# Patient Record
Sex: Female | Born: 1960 | Race: White | Hispanic: No | Marital: Married | Smoking: Never smoker
Health system: Southern US, Community
[De-identification: ages and names within clinical notes are randomized; demographics above are authoritative.]

## PROBLEM LIST (undated history)

## (undated) DIAGNOSIS — Z85828 Personal history of other malignant neoplasm of skin: Secondary | ICD-10-CM

## (undated) DIAGNOSIS — I10 Essential (primary) hypertension: Secondary | ICD-10-CM

## (undated) DIAGNOSIS — J45909 Unspecified asthma, uncomplicated: Secondary | ICD-10-CM

## (undated) DIAGNOSIS — I5189 Other ill-defined heart diseases: Secondary | ICD-10-CM

## (undated) DIAGNOSIS — I447 Left bundle-branch block, unspecified: Secondary | ICD-10-CM

## (undated) DIAGNOSIS — F419 Anxiety disorder, unspecified: Secondary | ICD-10-CM

## (undated) DIAGNOSIS — C50919 Malignant neoplasm of unspecified site of unspecified female breast: Secondary | ICD-10-CM

## (undated) HISTORY — DX: Anxiety disorder, unspecified: F41.9

## (undated) HISTORY — PX: BASAL CELL CARCINOMA EXCISION: SHX1214

## (undated) HISTORY — DX: Left bundle-branch block, unspecified: I44.7

## (undated) HISTORY — DX: Personal history of other malignant neoplasm of skin: Z85.828

## (undated) HISTORY — DX: Essential (primary) hypertension: I10

## (undated) HISTORY — DX: Malignant neoplasm of unspecified site of unspecified female breast: C50.919

## (undated) HISTORY — DX: Unspecified asthma, uncomplicated: J45.909

## (undated) HISTORY — DX: Other ill-defined heart diseases: I51.89

---

## 1998-01-11 ENCOUNTER — Inpatient Hospital Stay (HOSPITAL_COMMUNITY): Admission: AD | Admit: 1998-01-11 | Discharge: 1998-01-13 | Payer: Self-pay | Admitting: Obstetrics & Gynecology

## 1998-02-06 ENCOUNTER — Encounter (HOSPITAL_COMMUNITY): Admission: RE | Admit: 1998-02-06 | Discharge: 1998-05-07 | Payer: Self-pay | Admitting: *Deleted

## 1998-05-09 ENCOUNTER — Encounter (HOSPITAL_COMMUNITY): Admission: RE | Admit: 1998-05-09 | Discharge: 1998-08-07 | Payer: Self-pay | Admitting: *Deleted

## 1998-08-20 ENCOUNTER — Encounter (HOSPITAL_COMMUNITY): Admission: RE | Admit: 1998-08-20 | Discharge: 1998-11-18 | Payer: Self-pay | Admitting: *Deleted

## 1998-12-08 ENCOUNTER — Encounter (HOSPITAL_COMMUNITY): Admission: RE | Admit: 1998-12-08 | Discharge: 1999-03-08 | Payer: Self-pay | Admitting: *Deleted

## 1999-05-12 ENCOUNTER — Other Ambulatory Visit: Admission: RE | Admit: 1999-05-12 | Discharge: 1999-05-12 | Payer: Self-pay | Admitting: Obstetrics & Gynecology

## 1999-10-06 ENCOUNTER — Ambulatory Visit (HOSPITAL_COMMUNITY): Admission: RE | Admit: 1999-10-06 | Discharge: 1999-10-06 | Payer: Self-pay | Admitting: Obstetrics & Gynecology

## 1999-11-19 ENCOUNTER — Observation Stay (HOSPITAL_COMMUNITY): Admission: AD | Admit: 1999-11-19 | Discharge: 1999-11-20 | Payer: Self-pay | Admitting: Family Medicine

## 1999-12-03 ENCOUNTER — Inpatient Hospital Stay (HOSPITAL_COMMUNITY): Admission: AD | Admit: 1999-12-03 | Discharge: 1999-12-07 | Payer: Self-pay | Admitting: Obstetrics & Gynecology

## 1999-12-08 ENCOUNTER — Encounter: Admission: RE | Admit: 1999-12-08 | Discharge: 2000-03-07 | Payer: Self-pay | Admitting: Obstetrics & Gynecology

## 2000-03-09 ENCOUNTER — Encounter: Admission: RE | Admit: 2000-03-09 | Discharge: 2000-06-07 | Payer: Self-pay | Admitting: Obstetrics & Gynecology

## 2000-06-09 ENCOUNTER — Encounter: Admission: RE | Admit: 2000-06-09 | Discharge: 2000-09-07 | Payer: Self-pay | Admitting: Obstetrics & Gynecology

## 2000-09-08 ENCOUNTER — Encounter: Admission: RE | Admit: 2000-09-08 | Discharge: 2000-10-10 | Payer: Self-pay | Admitting: Obstetrics & Gynecology

## 2009-12-10 ENCOUNTER — Inpatient Hospital Stay (HOSPITAL_COMMUNITY): Admission: EM | Admit: 2009-12-10 | Discharge: 2009-12-13 | Payer: Self-pay | Admitting: Emergency Medicine

## 2009-12-10 ENCOUNTER — Ambulatory Visit: Payer: Self-pay | Admitting: Internal Medicine

## 2010-03-08 ENCOUNTER — Other Ambulatory Visit: Admission: RE | Admit: 2010-03-08 | Discharge: 2010-03-08 | Payer: Self-pay | Admitting: Family Medicine

## 2010-03-09 LAB — LIPID PANEL
Cholesterol: 133 mg/dL (ref 0–200)
HDL: 56 mg/dL (ref 35–70)
LDL Cholesterol: 63 mg/dL
Triglycerides: 75 mg/dL (ref 40–160)

## 2010-03-30 ENCOUNTER — Encounter: Admission: RE | Admit: 2010-03-30 | Discharge: 2010-03-30 | Payer: Self-pay | Admitting: Family Medicine

## 2010-05-10 ENCOUNTER — Ambulatory Visit: Payer: Self-pay | Admitting: Cardiovascular Disease

## 2010-10-21 ENCOUNTER — Encounter: Payer: Self-pay | Admitting: Cardiovascular Disease

## 2010-10-21 DIAGNOSIS — F419 Anxiety disorder, unspecified: Secondary | ICD-10-CM | POA: Insufficient documentation

## 2010-10-21 DIAGNOSIS — I5189 Other ill-defined heart diseases: Secondary | ICD-10-CM | POA: Insufficient documentation

## 2010-10-21 DIAGNOSIS — J45909 Unspecified asthma, uncomplicated: Secondary | ICD-10-CM | POA: Insufficient documentation

## 2010-10-21 DIAGNOSIS — I10 Essential (primary) hypertension: Secondary | ICD-10-CM | POA: Insufficient documentation

## 2010-10-21 DIAGNOSIS — Z85828 Personal history of other malignant neoplasm of skin: Secondary | ICD-10-CM | POA: Insufficient documentation

## 2010-10-21 DIAGNOSIS — I447 Left bundle-branch block, unspecified: Secondary | ICD-10-CM | POA: Insufficient documentation

## 2010-11-15 ENCOUNTER — Ambulatory Visit (INDEPENDENT_AMBULATORY_CARE_PROVIDER_SITE_OTHER): Payer: BC Managed Care – PPO | Admitting: Cardiovascular Disease

## 2010-11-15 DIAGNOSIS — I447 Left bundle-branch block, unspecified: Secondary | ICD-10-CM

## 2010-11-15 DIAGNOSIS — I119 Hypertensive heart disease without heart failure: Secondary | ICD-10-CM

## 2010-12-27 LAB — COMPREHENSIVE METABOLIC PANEL
ALT: 15 U/L (ref 0–35)
AST: 16 U/L (ref 0–37)
Albumin: 3.6 g/dL (ref 3.5–5.2)
Albumin: 3.8 g/dL (ref 3.5–5.2)
Albumin: 4 g/dL (ref 3.5–5.2)
Alkaline Phosphatase: 47 U/L (ref 39–117)
Alkaline Phosphatase: 54 U/L (ref 39–117)
BUN: 13 mg/dL (ref 6–23)
BUN: 16 mg/dL (ref 6–23)
Calcium: 9.1 mg/dL (ref 8.4–10.5)
Chloride: 108 mEq/L (ref 96–112)
Creatinine, Ser: 0.8 mg/dL (ref 0.4–1.2)
GFR calc Af Amer: >60 mL/min (ref >60)
GFR calc Af Amer: >60 mL/min (ref >60)
GFR calc non Af Amer: 59 mL/min — ABNORMAL LOW (ref >60)
GFR calc non Af Amer: >60 mL/min (ref >60)
Potassium: 4 mEq/L (ref 3.5–5.1)
Sodium: 135 mEq/L (ref 135–145)
Sodium: 137 mEq/L (ref 135–145)
Total Bilirubin: 0.9 mg/dL (ref 0.3–1.2)
Total Bilirubin: 0.9 mg/dL (ref 0.3–1.2)
Total Protein: 6.4 g/dL (ref 6.0–8.3)
Total Protein: 6.7 g/dL (ref 6.0–8.3)

## 2010-12-27 LAB — DIFFERENTIAL
Basophils Absolute: 0 10*3/uL (ref 0.0–0.1)
Basophils Absolute: 0 10*3/uL (ref 0.0–0.1)
Basophils Relative: 0 % (ref 0–1)
Basophils Relative: 1 % (ref 0–1)
Basophils Relative: 1 % (ref 0–1)
Lymphocytes Relative: 28 % (ref 12–46)
Lymphocytes Relative: 9 % — ABNORMAL LOW (ref 12–46)
Lymphs Abs: 0.7 10*3/uL (ref 0.7–4.0)
Lymphs Abs: 1.7 10*3/uL (ref 0.7–4.0)
Monocytes Absolute: 0.3 10*3/uL (ref 0.1–1.0)
Monocytes Absolute: 0.5 10*3/uL (ref 0.1–1.0)
Monocytes Absolute: 0.5 10*3/uL (ref 0.1–1.0)
Monocytes Relative: 9 % (ref 3–12)
Neutro Abs: 2.9 10*3/uL (ref 1.7–7.7)
Neutro Abs: 3.5 10*3/uL (ref 1.7–7.7)

## 2010-12-27 LAB — URINALYSIS, ROUTINE W REFLEX MICROSCOPIC
Ketones, ur: NEGATIVE mg/dL
Leukocytes, UA: NEGATIVE
Specific Gravity, Urine: 1.009 (ref 1.005–1.030)
pH: 7.5 (ref 5.0–8.0)

## 2010-12-27 LAB — POCT CARDIAC MARKERS
Myoglobin, poc: 67.5 ng/mL (ref 12–200)
Troponin i, poc: <0.05 ng/mL (ref 0.00–0.09)

## 2010-12-27 LAB — METANEPHRINES, URINE, 24 HOUR
Metaneph Total, Ur: 600 mcg/24 h (ref 182–739)
Metanephrines, Ur: 197 mcg/24 h (ref 58–203)
Normetanephrine, 24H Ur: 403 mcg/24 h (ref 88–649)

## 2010-12-27 LAB — CBC
HCT: 36.2 % (ref 36.0–46.0)
HCT: 38.5 % (ref 36.0–46.0)
Platelets: 189 10*3/uL (ref 150–400)
Platelets: 210 10*3/uL (ref 150–400)
Platelets: 218 10*3/uL (ref 150–400)
RDW: 13.5 % (ref 11.5–15.5)
RDW: 13.5 % (ref 11.5–15.5)
RDW: 13.7 % (ref 11.5–15.5)
WBC: 8.7 10*3/uL (ref 4.0–10.5)

## 2010-12-27 LAB — VMA + CREATININE, URINE (TIMED COLLECTION): Vanillylmandelic Acid, (VMA): 1.4 mg/24 h (ref <=6.0)

## 2010-12-27 LAB — CK TOTAL AND CKMB (NOT AT ARMC)
CK, MB: 1.5 ng/mL (ref 0.3–4.0)
Relative Index: INVALID (ref 0.0–2.5)
Relative Index: INVALID (ref 0.0–2.5)
Total CK: 62 U/L (ref 7–177)

## 2010-12-27 LAB — TROPONIN I: Troponin I: 0.03 ng/mL (ref 0.00–0.06)

## 2010-12-27 LAB — URINE MICROSCOPIC-ADD ON

## 2010-12-27 LAB — TSH: TSH: 1.34 u[IU]/mL (ref 0.350–4.500)

## 2011-02-18 NOTE — Discharge Summary (Signed)
[facility]  Patient:    Tonya Haynes, Tonya Haynes                     MRN: [id number] Adm. Date:  [id number] Disc. Date: [id number] Attending:  Mickle Mallory Dictator:   Leilani Able, P.A.                           Discharge Summary  FINAL DIAGNOSES:              1. Pregnancy-induced hypertension.                               2. Spontaneous vaginal delivery of a 6 pound 15 ounce                                  female infant with Apgars of 8 and 9.  HOSPITAL COURSE:              This 50 year old G3 P2 was admitted on December 03, 1999 for induction secondary to elevated blood pressures.  Patient is 36 weeks and 6 days.  AROM was performed and epidural was placed.  Patient had spontaneous vaginal delivery of a 6 pound 15 ounce female infant with Apgars of 8 and 9 over  second degree laceration.  There was a nuchal cord x 1.  Patients blood pressure stayed stable throughout the delivery.  Patients course was complicated by elevated blood pressure.  She was started on labetalol 100 mg 1 q.12h.  Her labs were all normal at this time.  Patient was not having any headaches or visual changes and baby was in the NICU on the ventilator with questionable pneumonia.  Patient was felt ready for discharge on postpartum day #4.  She has got some chronic hypertension with pregnancy-induced hypertension.  DIET:                         Patient was sent home on a regular diet.  ACTIVITY:                     Told to decrease activities.  MEDICATIONS:                  1. Darvocet-N 100 1 q.4h. as needed for pain.                               2. Told to continue labetalol 200 mg 1 b.i.d.  FOLLOW-UP:                    Was to return to the office in one week for a blood pressure check.  She was also to call for diastolic blood pressure above 213.  LABORATORY ON DISCHARGE:      Patient had a hemoglobin of 10.7, white blood cellcount of 8.7. DD:  12/27/99 TD:   12/27/99 Job: [id number]

## 2011-02-18 NOTE — Discharge Summary (Signed)
[facility]  Patient:    Tonya Haynes, Tonya Haynes                     MRN: [id number] Adm. Date:  [id number] Disc. Date: [id number] Attending:  Miguel Aschoff                           Discharge Summary  ADMISSION DIAGNOSES:          1. Intrauterine pregnancy at 35 weeks.                               2. Chronic hypertension.                               3. Possible pregnancy-induced hypertension.  FINAL DIAGNOSES:              1. Intrauterine pregnancy at 35 weeks.                               2. Chronic hypertension.  BRIEF ADMISSION HISTORY:      The patient is a 50 year old white female, gravida 3, para 2-0-0-2 with an estimated date of confinement of December 25, 1999.  The patient has had a history of elevated blood pressures.  She was on no medications during per pregnancy.  She was seen in the office on November 19, 1999, and had a blood pressure of 220/118 and repeat value of 180/118.  She was admitted because of this hypertension for further evaluation.  HOSPITAL COURSE:              She was placed on bed rest and on a fetal monitor. On bed rest her blood pressure came down to levels of approximately 150/98. Her fetal heart tracing remained reactive.  On bed rest and with the commencement of Aldomet therapy 500 mg p.o. t.i.d. her blood pressure returned to approximately  140/95 range again, and with her being stable and the tracing reactive it was felt that she was stable and able to be discharged home.  LABORATORY STUDIES:           Laboratory studies were obtained which revealed a  normal PT and PTT.  LDH was 174, hemoglobin 13.1, hematocrit 38, white count 8900, platelet count 184,000.  Chemistry panel revealed elevation of the alkaline phosphatase to 127, albumin was 2.7, fibrinogen 508.  Uric acid 4.1.  DISCHARGE MEDICATIONS:        The patient was sent home on Aldomet 500 mg p.o. t.i.d.  FOLLOWUP:                     She is  instructed to return to the office on November 23, 1999, for nonstress test and reassessment of her blood pressure. he is to call if there are any problems such as headache or abdominal pain, uterine contractions, or bleeding. DD:  11/20/99 TD:  11/21/99 Job: [id number]

## 2011-03-10 IMAGING — CT CT ABDOMEN W/O CM
2 of 4 series · 17 of 46 positions shown, 19 images · non-contrast
Comparison: None

CLINICAL DATA: 48 old female with hypertension.  Evaluate adrenal
glands.

CT ABDOMEN WITHOUT CONTRAST
TECHNIQUE: Multidetector CT imaging of the abdomen was performed
following the standard protocol without IV contrast.

[Series 4: recon 3: routine abdomen · axial · 0.64mm/px · z∈[-320,-66]mm · 14 of 221 slices shown, 16 images]
[im 9/221  soft-tissue]
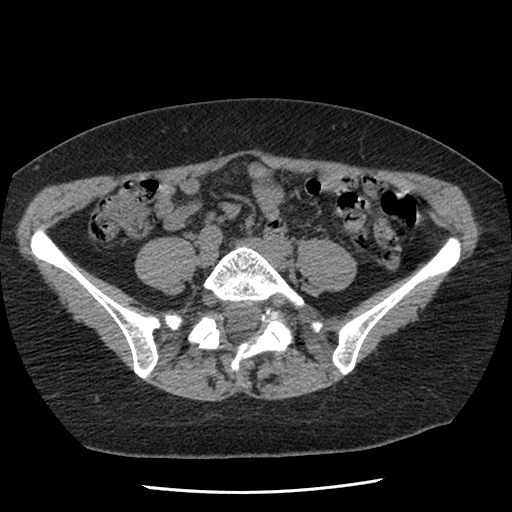
[im 9/221  bone]
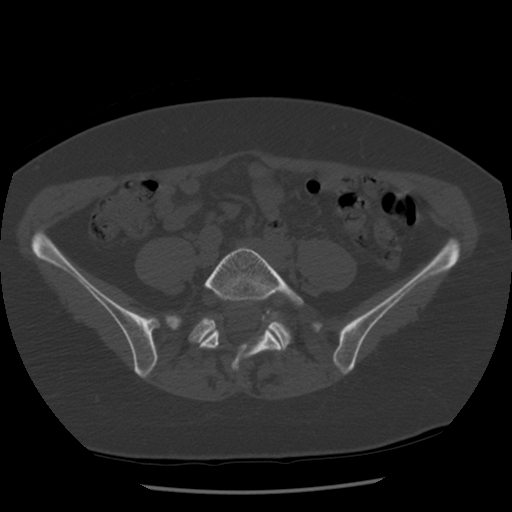
[im 27/221  soft-tissue]
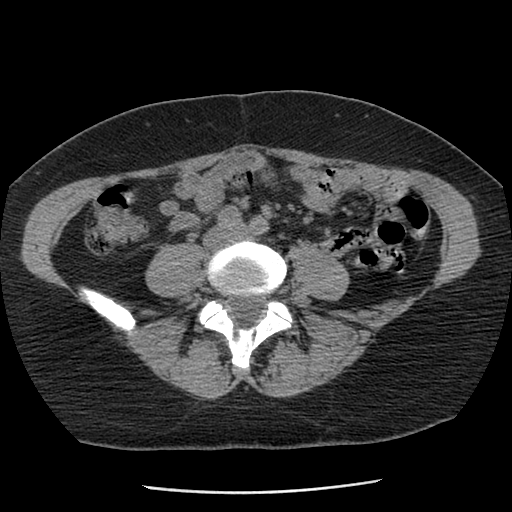
[im 45/221  soft-tissue]
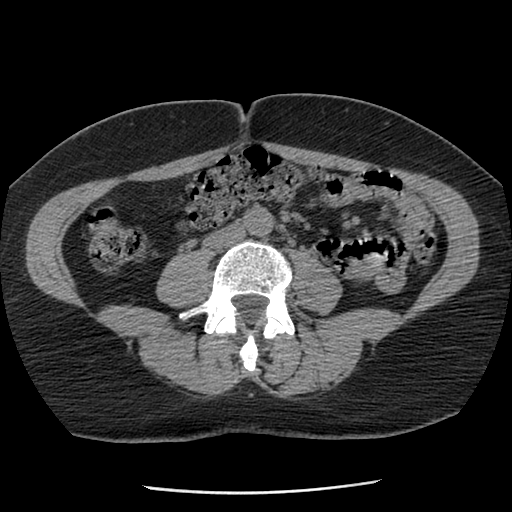
[im 62/221  soft-tissue]
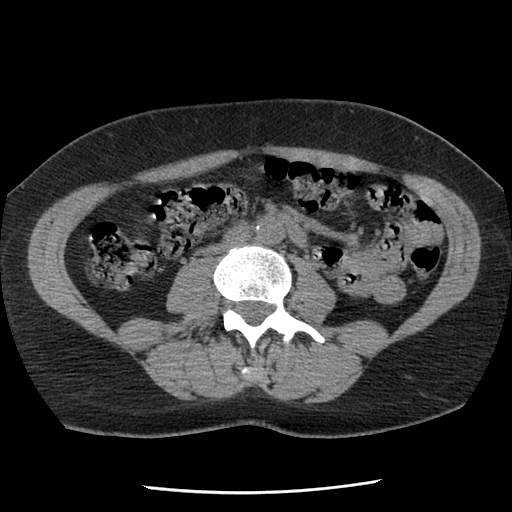
[im 71/221  soft-tissue]
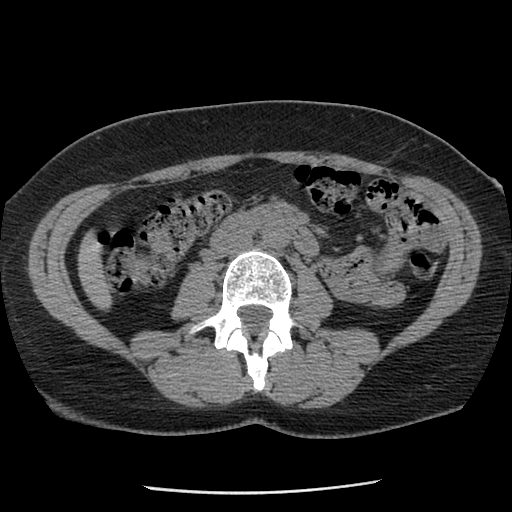
[im 89/221  soft-tissue]
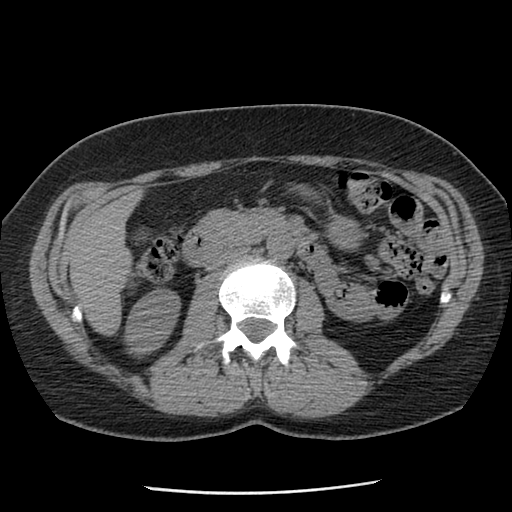
[im 106/221  soft-tissue]
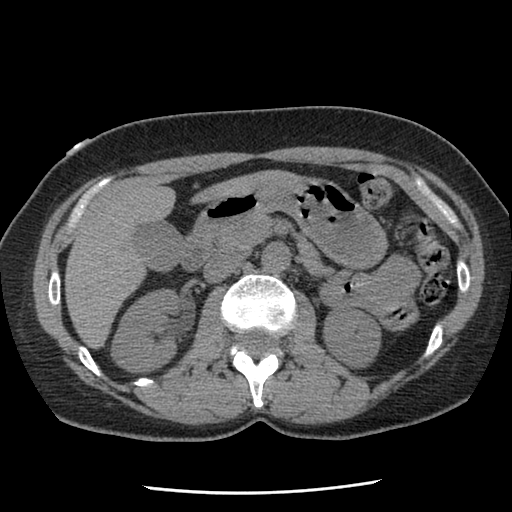
[im 115/221  soft-tissue]
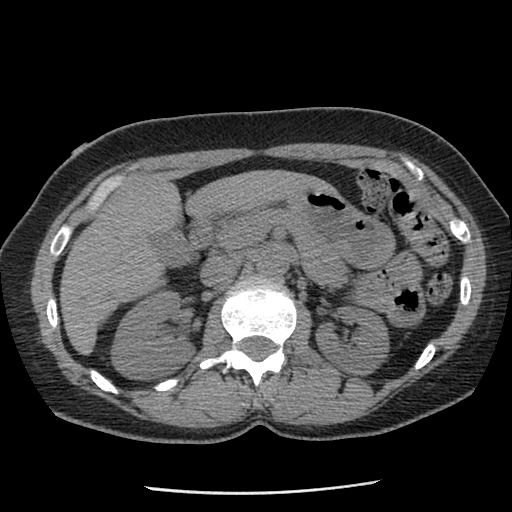
[im 133/221  soft-tissue]
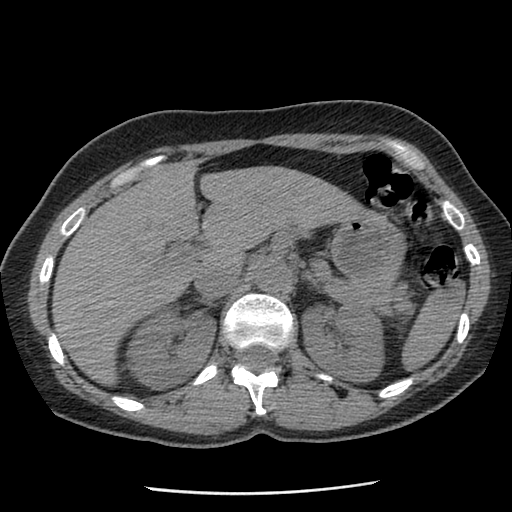
[im 133/221  bone]
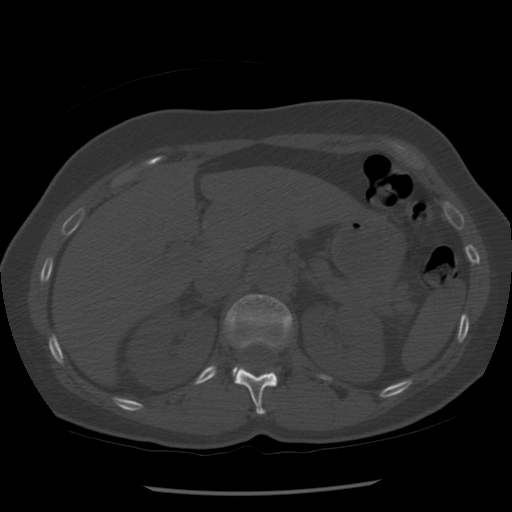
[im 150/221  soft-tissue]
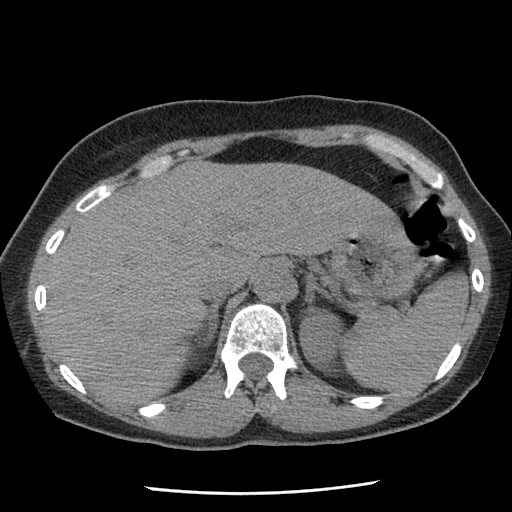
[im 168/221  soft-tissue]
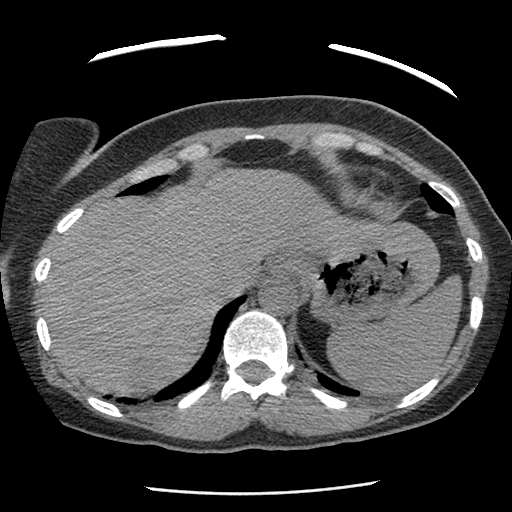
[im 177/221  soft-tissue]
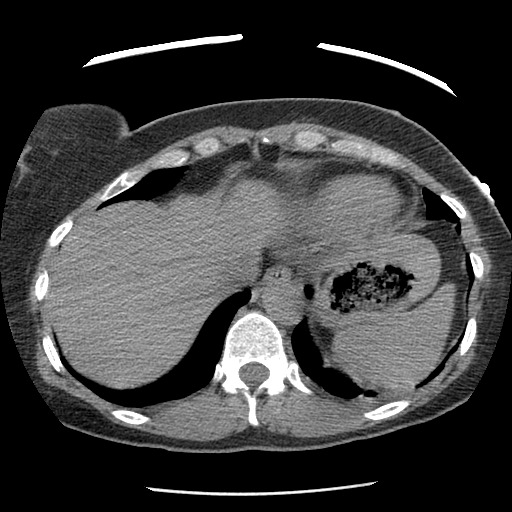
[im 194/221  soft-tissue]
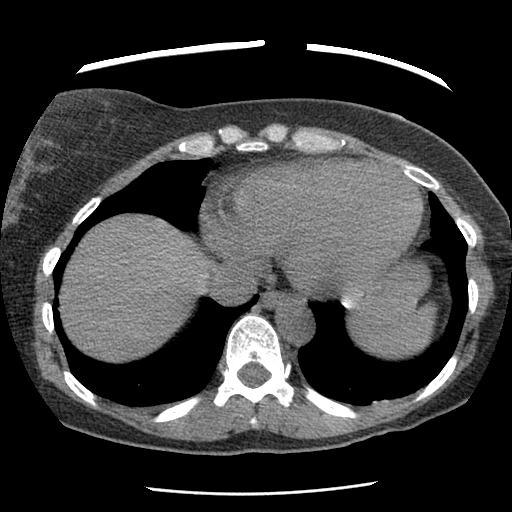
[im 212/221  soft-tissue]
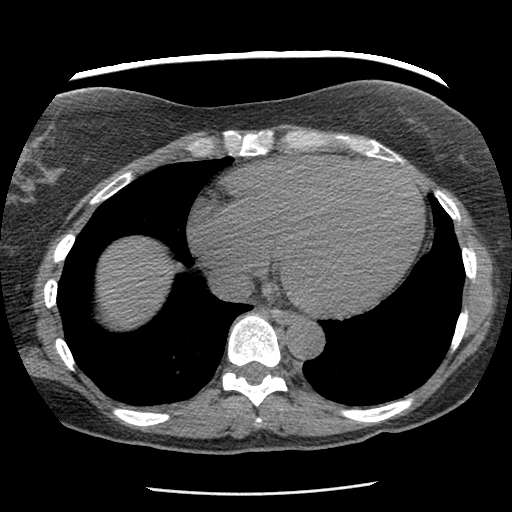

[Series 401: coronal · coronal · 0.64mm/px · 3 of 80 slices shown]
[im 27/80  soft-tissue]
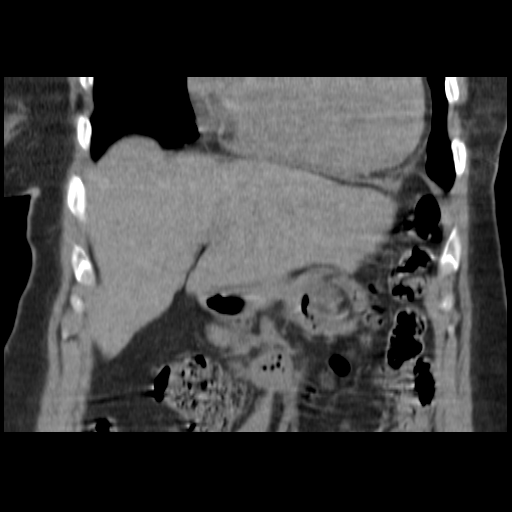
[im 36/80  soft-tissue]
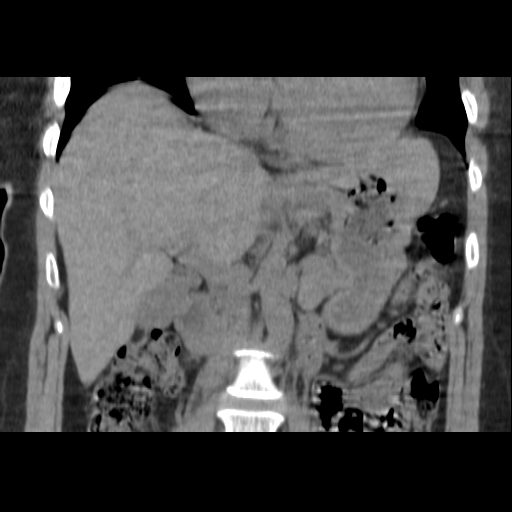
[im 44/80  soft-tissue]
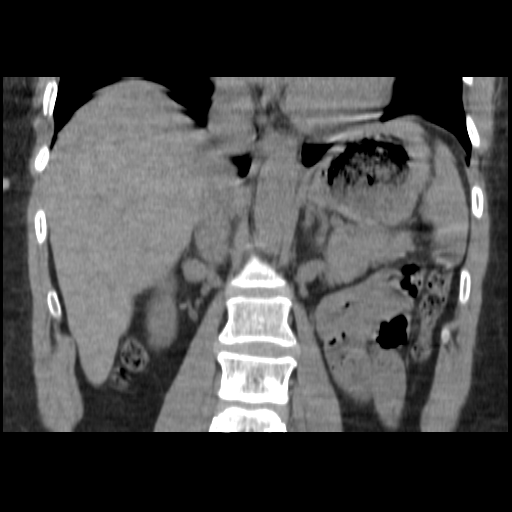

[17 of 46 positions shown; findings below may reference images not displayed]

FINDINGS: The adrenal glands are unremarkable without discrete
nodule or mass.

The liver, gallbladder, spleen, pancreas and kidneys are
unremarkable.
There is no evidence of enlarged lymph nodes, free fluid, biliary
dilatation, or abdominal aortic aneurysm.
The visualized bowel is within normal limits.
No acute or suspicious bony abnormalities are identified.
Bilateral L5 pars defects are identified.
IMPRESSION: Unremarkable adrenal glands.

No acute abnormalities.

Bilateral L5 pars defects.

## 2011-03-11 IMAGING — CT CT HEAD W/O CM
1 series · 16 of 30 positions shown, 20 images · non-contrast
Comparison: None.

CLINICAL DATA: Uncontrolled high blood pressure.  Anxiety.

CT HEAD WITHOUT CONTRAST
TECHNIQUE: Contiguous axial images were obtained from the base of
the skull through the vertex without contrast.

[Series 2: head routine 4.8 h37s · axial · 0.48mm/px · z∈[-130,+0]mm · 16 of 30 slices shown, 20 images]
[im 2/30  brain]
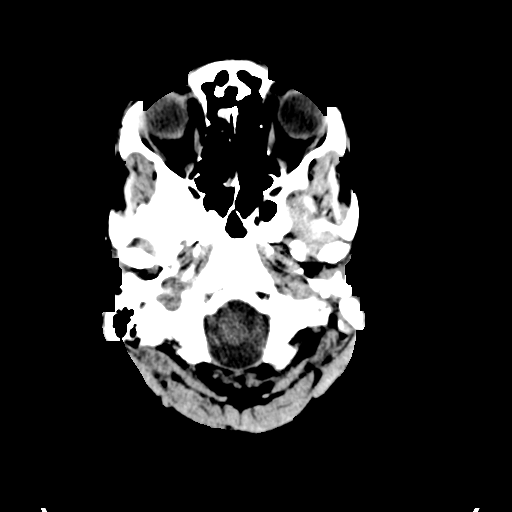
[im 2/30  bone]
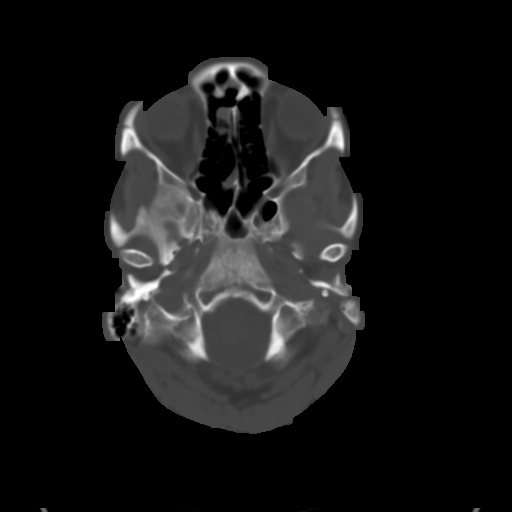
[im 4/30  brain]
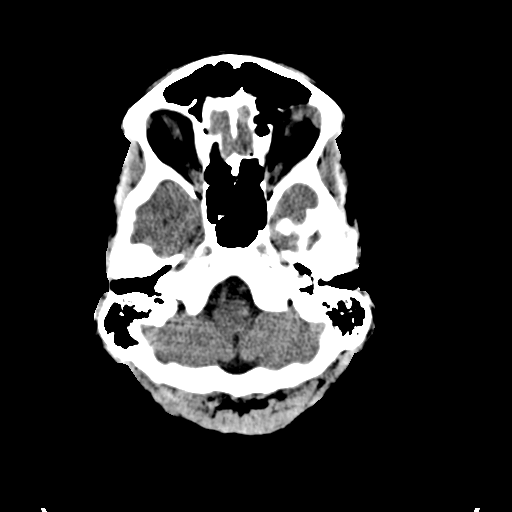
[im 6/30  brain]
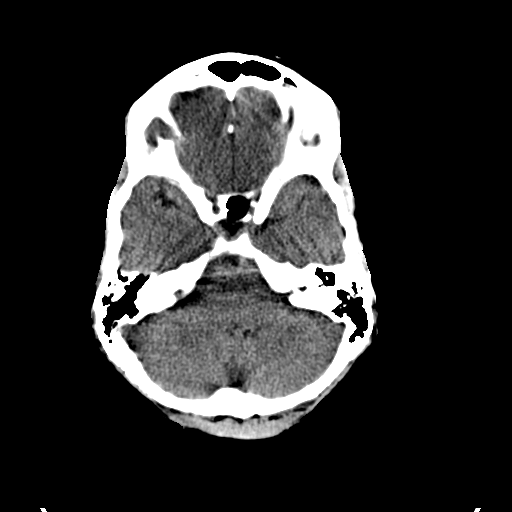
[im 8/30  brain]
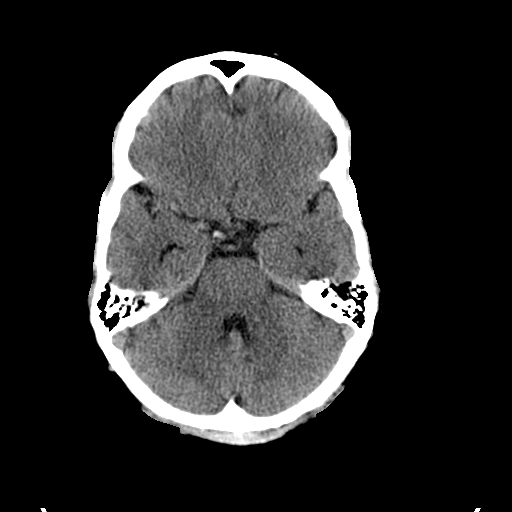
[im 9/30  brain]
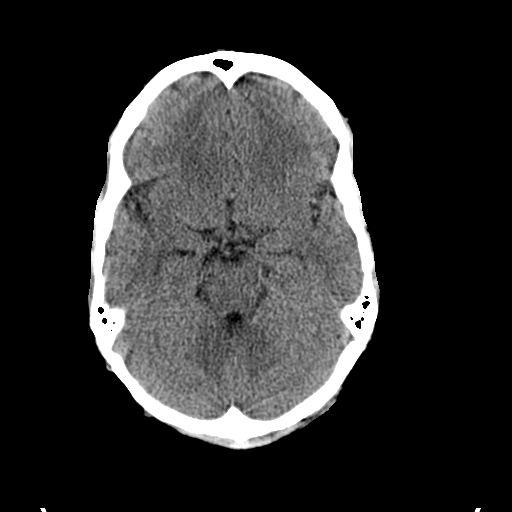
[im 9/30  bone]
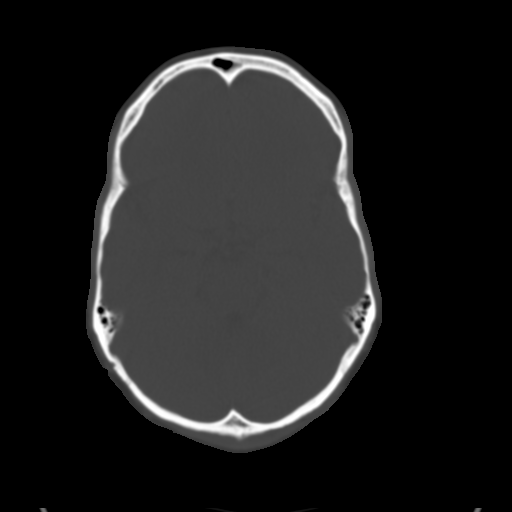
[im 11/30  brain]
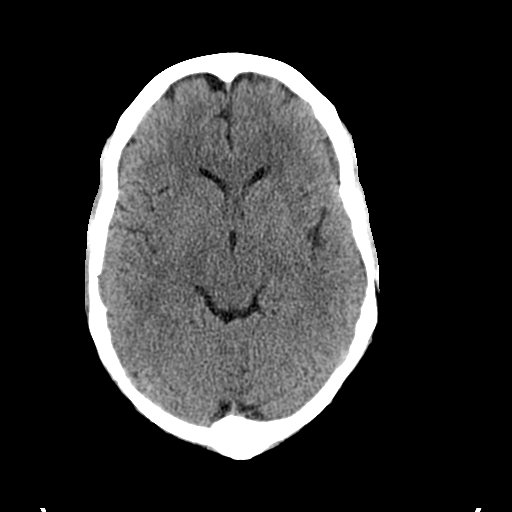
[im 13/30  brain]
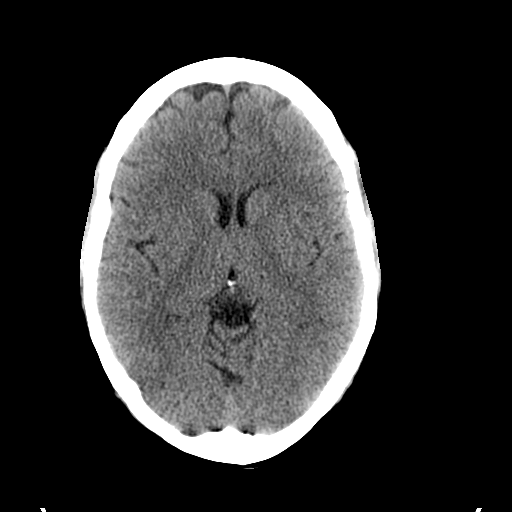
[im 15/30  brain]
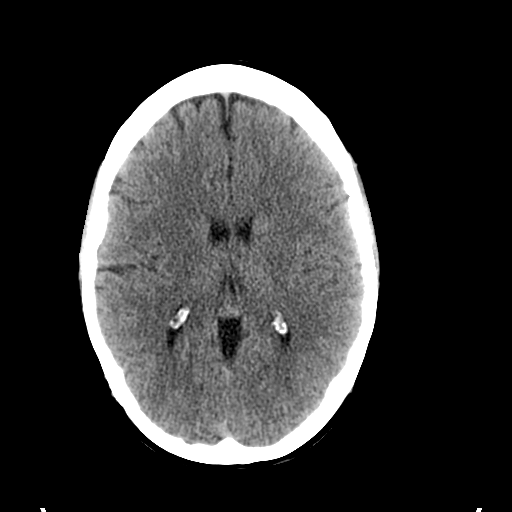
[im 16/30  brain]
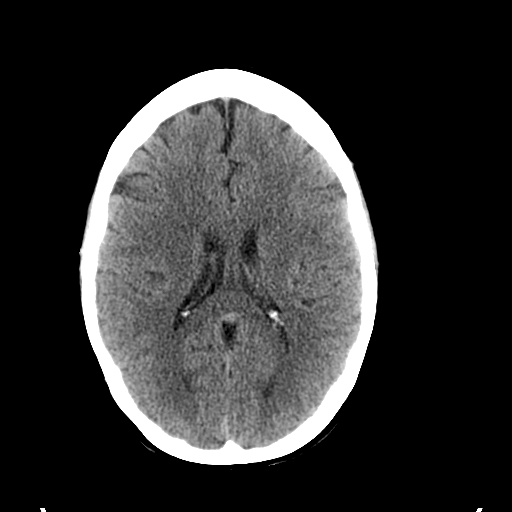
[im 16/30  bone]
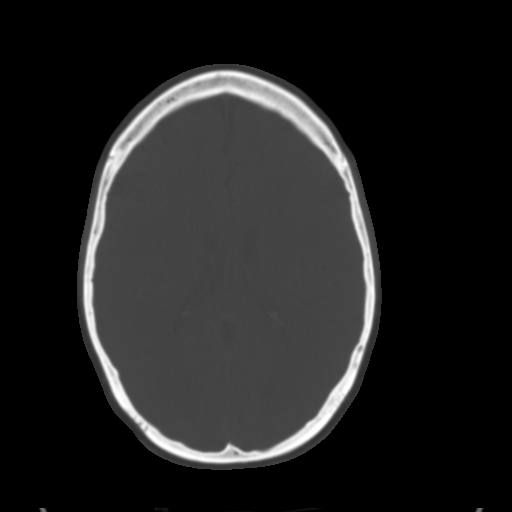
[im 18/30  brain]
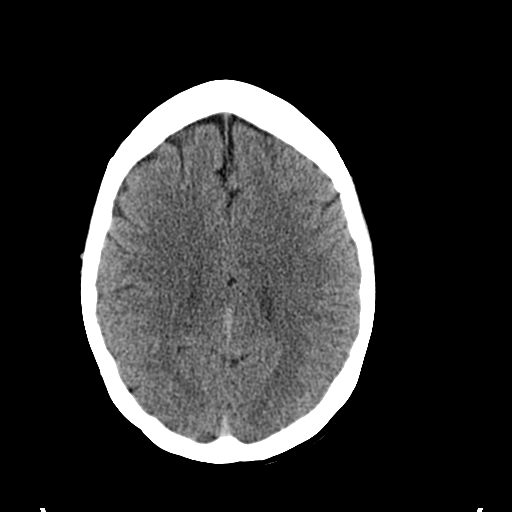
[im 20/30  brain]
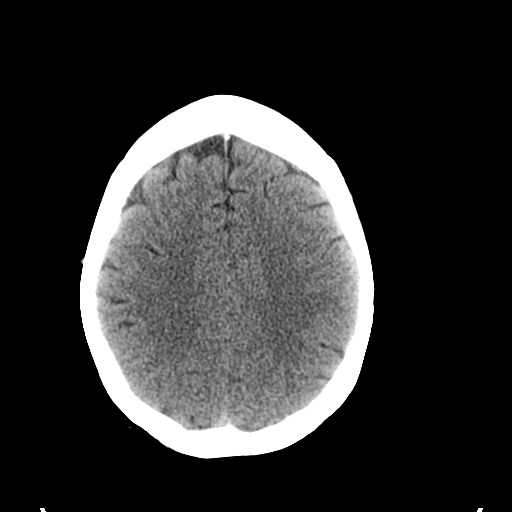
[im 22/30  brain]
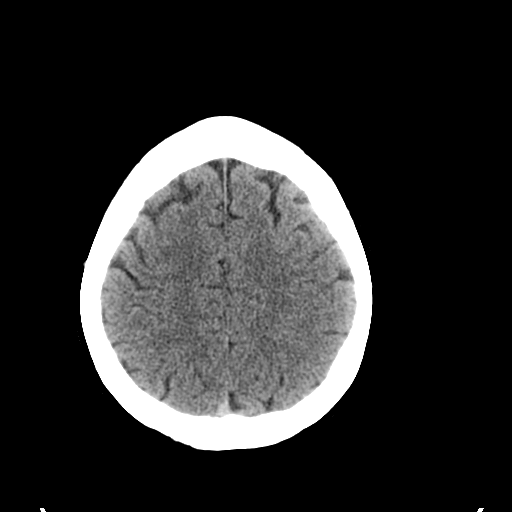
[im 23/30  brain]
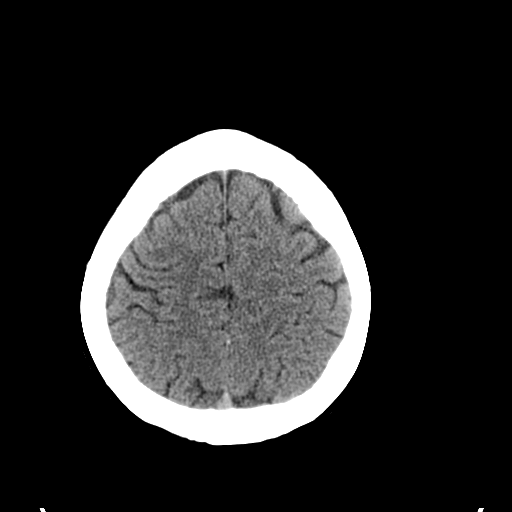
[im 23/30  bone]
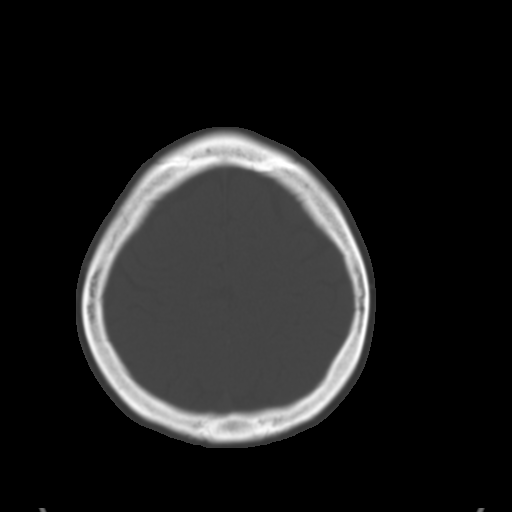
[im 25/30  brain]
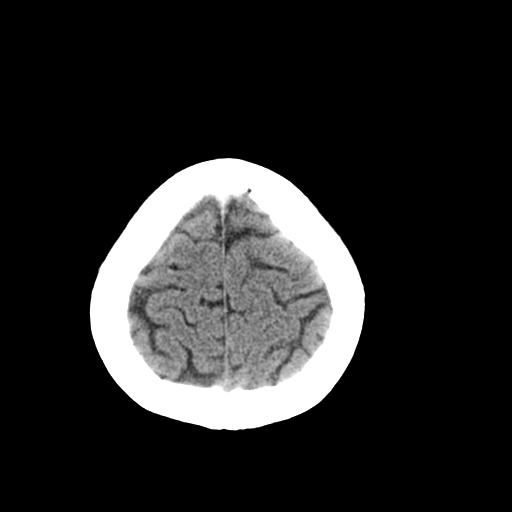
[im 27/30  brain]
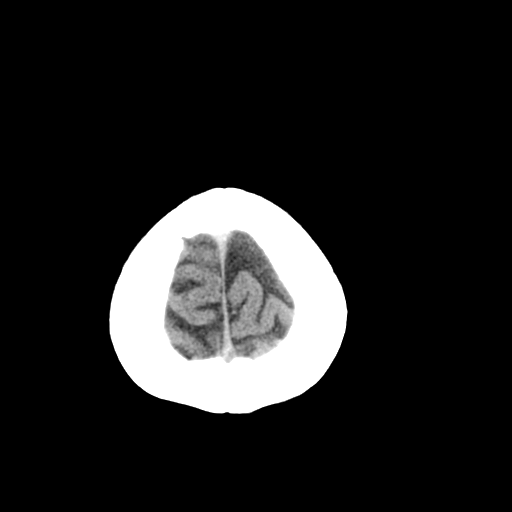
[im 29/30  brain]
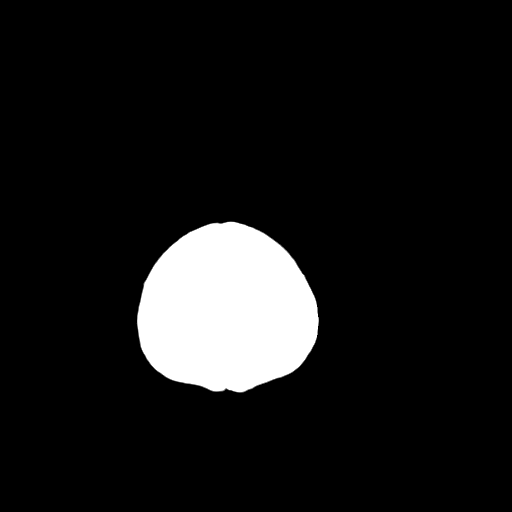

[16 of 30 positions shown; findings below may reference images not displayed]

FINDINGS: No intracranial hemorrhage. Left frontal horn
periventricular subtle hypodensity.  Small acute infarct or primary
white matter abnormality cannot be excluded otherwise no evidence
of large acute infarct.  No intracranial mass lesion detected on
this unenhanced exam.

Partial opacification right ethmoid sinus air cells and left
maxillary sinus.
IMPRESSION: No intracranial hemorrhage.

Left frontal horn periventricular subtle hypodensity.  Small acute
infarct or primary white matter abnormality cannot be excluded
otherwise no evidence of large acute infarct.

Paranasal sinus opacification.

This has been made a call report.

## 2011-04-11 ENCOUNTER — Telehealth: Payer: Self-pay | Admitting: *Deleted

## 2011-04-11 NOTE — Telephone Encounter (Signed)
Reminder for echo aug ist 7:30 am sent for precert.

## 2011-04-25 ENCOUNTER — Other Ambulatory Visit: Payer: Self-pay | Admitting: Family Medicine

## 2011-04-25 DIAGNOSIS — Z1231 Encounter for screening mammogram for malignant neoplasm of breast: Secondary | ICD-10-CM

## 2011-05-02 ENCOUNTER — Other Ambulatory Visit (HOSPITAL_COMMUNITY): Payer: Self-pay | Admitting: Cardiovascular Disease

## 2011-05-02 DIAGNOSIS — I447 Left bundle-branch block, unspecified: Secondary | ICD-10-CM

## 2011-05-04 ENCOUNTER — Ambulatory Visit (HOSPITAL_COMMUNITY): Payer: BC Managed Care – PPO | Attending: Cardiovascular Disease | Admitting: Radiology

## 2011-05-04 ENCOUNTER — Encounter: Payer: Self-pay | Admitting: *Deleted

## 2011-05-04 DIAGNOSIS — I519 Heart disease, unspecified: Secondary | ICD-10-CM | POA: Insufficient documentation

## 2011-05-04 DIAGNOSIS — I447 Left bundle-branch block, unspecified: Secondary | ICD-10-CM

## 2011-05-04 DIAGNOSIS — I10 Essential (primary) hypertension: Secondary | ICD-10-CM | POA: Insufficient documentation

## 2011-05-05 ENCOUNTER — Ambulatory Visit
Admission: RE | Admit: 2011-05-05 | Discharge: 2011-05-05 | Disposition: A | Discharge status: Home / Self Care | Payer: BC Managed Care – PPO | Source: Ambulatory Visit | Attending: Family Medicine | Admitting: Family Medicine

## 2011-05-05 ENCOUNTER — Telehealth: Payer: Self-pay | Admitting: *Deleted

## 2011-05-05 DIAGNOSIS — Z1231 Encounter for screening mammogram for malignant neoplasm of breast: Secondary | ICD-10-CM

## 2011-05-05 NOTE — Telephone Encounter (Signed)
Message copied by Antony Odea on Thu May 05, 2011 10:12 AM ------      Message from: Vesta Mixer      Created: Wed May 04, 2011  1:58 PM       Much better

## 2011-05-05 NOTE — Telephone Encounter (Signed)
Left msg for echo/ much better results and office number if further questions.

## 2011-05-31 ENCOUNTER — Other Ambulatory Visit: Payer: Self-pay | Admitting: Dermatology

## 2011-06-03 ENCOUNTER — Other Ambulatory Visit: Payer: Self-pay | Admitting: Cardiovascular Disease

## 2011-06-07 ENCOUNTER — Other Ambulatory Visit: Payer: Self-pay | Admitting: *Deleted

## 2011-06-07 MED ORDER — NEBIVOLOL HCL 5 MG PO TABS: 5.0000 mg | ORAL_TABLET | Freq: Every day | ORAL | Status: DC

## 2011-06-07 NOTE — Telephone Encounter (Signed)
Fax received from pharmacy. Refill completed. Jodette Nahlia Hellmann RN  

## 2011-06-16 ENCOUNTER — Ambulatory Visit (INDEPENDENT_AMBULATORY_CARE_PROVIDER_SITE_OTHER): Payer: BC Managed Care – PPO | Admitting: Cardiovascular Disease

## 2011-06-16 ENCOUNTER — Encounter: Payer: Self-pay | Admitting: Cardiovascular Disease

## 2011-06-16 DIAGNOSIS — I10 Essential (primary) hypertension: Secondary | ICD-10-CM

## 2011-06-16 DIAGNOSIS — I447 Left bundle-branch block, unspecified: Secondary | ICD-10-CM

## 2011-06-16 NOTE — Progress Notes (Signed)
Tonya Haynes Date of Birth  29-May-1961 [facility] [address]     [address] [place], [place]  [address] [phone]  Fax  [phone]  History of Present Illness:  Tonya Haynes is a middle-aged female who I have been seeing for hypertension. She's done very well. Her last echocardiogram shows normal left ventricular systolic function. She did have moderate left ventricular hypertrophy but this has improved slightly and now she only has mild left ventricular hypertrophy.  She is able to do all of her normal activities without any significant problems.  Current Outpatient Prescriptions on File Prior to Visit  Medication Sig Dispense Refill  . lisinopril (PRINIVIL,ZESTRIL) 10 MG tablet take 1 tablet by mouth once daily  90 tablet  3  . nebivolol (BYSTOLIC) 5 MG tablet Take 1 tablet (5 mg total) by mouth daily.  30 tablet  5    No Known Allergies  Past Medical History  Diagnosis Date  . Hypertension   . LBBB (left bundle branch block)   . Anxiety   . Asthma   . Diastolic dysfunction   . History of basal cell carcinoma     Past Surgical History  Procedure Date  . Basal cell carcinoma excision     RIGHT DELTOID MUSCLE    History  Smoking status  . Not on file  Smokeless tobacco  . Not on file    History  Alcohol Use     Family History  Problem Relation Age of Onset  . Hypertension Father   . Breast cancer Mother   . Fibromyalgia Mother     Reviw of Systems:  Reviewed in the HPI.  All other systems are negative.  Physical Exam: BP 142/82  Pulse 84  Ht 5\' 8"  (1.727 m)  Wt 167 lb 12.8 oz (76.114 kg)  BMI 25.51 kg/m2 The patient is alert and oriented x 3.  The mood and affect are normal.   Skin: warm and dry.  Color is normal.    HEENT:   the sclera are nonicteric.  The mucous membranes are moist.  The carotids are 2+ without bruits.  There is no thyromegaly.  There is no JVD.    Lungs: clear.  The chest wall is  non tender.    Heart: regular rate with a normal S1 and S2.  There are no murmurs, gallops, or rubs. The PMI is not displaced.     Abdomen: good bowel sounds.  There is no guarding or rebound.  There is no hepatosplenomegaly or tenderness.  There are no masses.   Extremities:  no clubbing, cyanosis, or edema.  The legs are without rashes.  The distal pulses are intact.   Neuro:  Cranial nerves II - XII are intact.  Motor and sensory functions are intact.    The gait is normal.   Assessment / Plan:

## 2011-06-16 NOTE — Assessment & Plan Note (Addendum)
Her blood pressure readings have been well controlled. Her blood pressure is always a little bit elevated in the office but her readings at home are quite normal. We'll have her continue with the current dose of diastolic and lisinopril. I'll see her again in 6 months.

## 2011-06-30 ENCOUNTER — Other Ambulatory Visit: Payer: Self-pay | Admitting: Family Medicine

## 2011-06-30 ENCOUNTER — Other Ambulatory Visit (HOSPITAL_COMMUNITY)
Admission: RE | Admit: 2011-06-30 | Discharge: 2011-06-30 | Disposition: A | Discharge status: Home / Self Care | Payer: BC Managed Care – PPO | Source: Ambulatory Visit | Attending: Family Medicine | Admitting: Family Medicine

## 2011-06-30 DIAGNOSIS — Z0000 Encounter for general adult medical examination without abnormal findings: Secondary | ICD-10-CM | POA: Insufficient documentation

## 2011-12-31 ENCOUNTER — Encounter: Payer: Self-pay | Admitting: Cardiovascular Disease

## 2012-01-01 ENCOUNTER — Other Ambulatory Visit: Payer: Self-pay | Admitting: Cardiovascular Disease

## 2012-01-02 NOTE — Telephone Encounter (Signed)
Fax Received. Refill Completed. Tonya Haynes (R.M.A)   

## 2012-04-21 ENCOUNTER — Other Ambulatory Visit: Payer: Self-pay | Admitting: Cardiovascular Disease

## 2012-04-23 NOTE — Telephone Encounter (Signed)
Refilled lisinopril

## 2012-05-28 ENCOUNTER — Other Ambulatory Visit: Payer: Self-pay | Admitting: Family Medicine

## 2012-05-28 DIAGNOSIS — Z1231 Encounter for screening mammogram for malignant neoplasm of breast: Secondary | ICD-10-CM

## 2012-06-13 ENCOUNTER — Ambulatory Visit
Admission: RE | Admit: 2012-06-13 | Discharge: 2012-06-13 | Disposition: A | Discharge status: Home / Self Care | Payer: BC Managed Care – PPO | Source: Ambulatory Visit | Attending: Family Medicine | Admitting: Family Medicine

## 2012-06-13 DIAGNOSIS — Z1231 Encounter for screening mammogram for malignant neoplasm of breast: Secondary | ICD-10-CM

## 2012-07-10 ENCOUNTER — Encounter: Payer: Self-pay | Admitting: Cardiovascular Disease

## 2012-07-17 ENCOUNTER — Ambulatory Visit: Payer: BC Managed Care – PPO | Admitting: Cardiovascular Disease

## 2012-07-23 ENCOUNTER — Other Ambulatory Visit: Payer: Self-pay | Admitting: Cardiovascular Disease

## 2012-07-23 NOTE — Telephone Encounter (Signed)
Fax Received. Refill Completed. Tonya Haynes (R.M.A)   

## 2012-08-09 ENCOUNTER — Ambulatory Visit (INDEPENDENT_AMBULATORY_CARE_PROVIDER_SITE_OTHER): Payer: BC Managed Care – PPO | Admitting: Cardiovascular Disease

## 2012-08-09 ENCOUNTER — Encounter: Payer: Self-pay | Admitting: Cardiovascular Disease

## 2012-08-09 VITALS — BP 144/88 | HR 70 | Ht 68.0 in | Wt 179.8 lb

## 2012-08-09 DIAGNOSIS — I447 Left bundle-branch block, unspecified: Secondary | ICD-10-CM

## 2012-08-09 DIAGNOSIS — I10 Essential (primary) hypertension: Secondary | ICD-10-CM

## 2012-08-09 NOTE — Progress Notes (Addendum)
Tonya Haynes Date of Birth  04-Apr-1961 [facility] [address]     [address] [place], [place]  [address] [phone]  Fax  [phone]  History of Present Illness:  Tonya Haynes is a middle-aged female who I have been seeing for hypertension. She's done very well. Her last echocardiogram shows normal left ventricular systolic function. She did have moderate left ventricular hypertrophy but this has improved slightly and now she only has mild left ventricular hypertrophy.  She is able to do all of her normal activities without any significant problems.  Current Outpatient Prescriptions on File Prior to Visit  Medication Sig Dispense Refill  . BYSTOLIC 5 MG tablet take 1 tablet by mouth once daily  30 each  0  . lisinopril (PRINIVIL,ZESTRIL) 10 MG tablet take 1 tablet by mouth once daily  90 tablet  0  . loratadine (CLARITIN) 10 MG tablet Take 10 mg by mouth daily.      . Multiple Vitamin (MULTI-VITAMIN PO) Take by mouth daily.          No Known Allergies  Past Medical History  Diagnosis Date  . Hypertension   . LBBB (left bundle branch block)   . Anxiety   . Asthma   . Diastolic dysfunction   . History of basal cell carcinoma     Past Surgical History  Procedure Date  . Basal cell carcinoma excision     RIGHT DELTOID MUSCLE    History  Smoking status  . Never Smoker   Smokeless tobacco  . Not on file    History  Alcohol Use: Not on file    Family History  Problem Relation Age of Onset  . Hypertension Father   . Breast cancer Mother   . Fibromyalgia Mother     Reviw of Systems:  Reviewed in the HPI.  All other systems are negative.  Physical Exam: BP 144/88  Pulse 70  Ht 5\' 8"  (1.727 m)  Wt 179 lb 12.8 oz (81.557 kg)  BMI 27.34 kg/m2 The patient is alert and oriented x 3.  The mood and affect are normal.   Skin: warm and dry.  Color is normal.    HEENT:   the sclera are nonicteric.  The mucous membranes  are moist.  The carotids are 2+ without bruits.  There is no thyromegaly.  There is no JVD.    Lungs: clear.  The chest wall is non tender.    Heart: regular rate with a normal S1 and S2.  There are no murmurs, gallops, or rubs. The PMI is not displaced.     Abdomen: good bowel sounds.  There is no guarding or rebound.  There is no hepatosplenomegaly or tenderness.  There are no masses.   Extremities:  no clubbing, cyanosis, or edema.  The legs are without rashes.  The distal pulses are intact.   Neuro:  Cranial nerves II - XII are intact.  Motor and sensory functions are intact.    The gait is normal.  EKG: Normal sinus rhythm at 70 beats a minute. She has a left bundle branch block which is unchanged from previous tracings. Assessment / Plan:

## 2012-08-09 NOTE — Patient Instructions (Addendum)
Your physician wants you to follow-up in: 1 year  You will receive a reminder letter in the mail two months in advance. If you don't receive a letter, please call our office to schedule the follow-up appointment.  Your physician recommends that you return for a FASTING lipid profile: get a copy from your pcp

## 2012-08-09 NOTE — Assessment & Plan Note (Signed)
Boyd Kerbs  seems to be doing well. Her blood pressure readings at home are quite well controlled. Her blood pressure is always a little higher in the office. She has some issues with anxiety.  We'll continue her current medications. She still has a left bundle branch block. Her left ventricular systolic function has improved dramatically on medical therapy.

## 2012-08-09 NOTE — Assessment & Plan Note (Signed)
Her left bundle branch block remains unchanged.  We have never done an ischemic workup but she has never had any episodes of angina and given her young age I don't think that we need to pursue this. I suspect that her left bundle branch block was caused by her untreated hypertension and mild congestive heart failure. Her mild congestive heart failure has now improved since we've had her on medical therapy.

## 2012-08-10 ENCOUNTER — Other Ambulatory Visit: Payer: Self-pay | Admitting: *Deleted

## 2012-08-10 MED ORDER — NEBIVOLOL HCL 5 MG PO TABS: 5.0000 mg | ORAL_TABLET | Freq: Every day | ORAL | Status: DC

## 2012-08-10 MED ORDER — LISINOPRIL 10 MG PO TABS: 10.0000 mg | ORAL_TABLET | Freq: Every day | ORAL | Status: DC

## 2012-08-10 NOTE — Telephone Encounter (Signed)
REFILL COMPETED PER PT REQUEST.

## 2012-08-29 ENCOUNTER — Encounter: Payer: Self-pay | Admitting: Cardiovascular Disease

## 2013-08-09 ENCOUNTER — Other Ambulatory Visit: Payer: Self-pay

## 2013-08-09 MED ORDER — LISINOPRIL 10 MG PO TABS: 10.0000 mg | ORAL_TABLET | Freq: Every day | ORAL | Status: DC

## 2013-08-30 ENCOUNTER — Other Ambulatory Visit: Payer: Self-pay

## 2013-08-30 MED ORDER — NEBIVOLOL HCL 5 MG PO TABS: 5.0000 mg | ORAL_TABLET | Freq: Every day | ORAL | Status: DC

## 2013-12-19 ENCOUNTER — Other Ambulatory Visit: Payer: Self-pay

## 2013-12-19 MED ORDER — NEBIVOLOL HCL 5 MG PO TABS: 5.0000 mg | ORAL_TABLET | Freq: Every day | ORAL | Status: DC

## 2013-12-19 MED ORDER — LISINOPRIL 10 MG PO TABS: 10.0000 mg | ORAL_TABLET | Freq: Every day | ORAL | Status: DC

## 2014-01-29 ENCOUNTER — Other Ambulatory Visit: Payer: Self-pay | Admitting: Cardiovascular Disease

## 2014-01-29 ENCOUNTER — Other Ambulatory Visit: Payer: Self-pay | Admitting: Nurse Practitioner

## 2014-01-29 ENCOUNTER — Other Ambulatory Visit: Payer: Self-pay

## 2014-01-29 MED ORDER — LISINOPRIL 10 MG PO TABS: 10.0000 mg | ORAL_TABLET | Freq: Every day | ORAL | Status: DC

## 2014-01-29 MED ORDER — NEBIVOLOL HCL 5 MG PO TABS: 5.0000 mg | ORAL_TABLET | Freq: Every day | ORAL | Status: DC

## 2014-02-07 ENCOUNTER — Ambulatory Visit (INDEPENDENT_AMBULATORY_CARE_PROVIDER_SITE_OTHER): Payer: BC Managed Care – PPO | Admitting: Cardiovascular Disease

## 2014-02-07 ENCOUNTER — Encounter: Payer: Self-pay | Admitting: Cardiovascular Disease

## 2014-02-07 VITALS — BP 124/76 | HR 80 | Ht 68.5 in | Wt 195.0 lb

## 2014-02-07 DIAGNOSIS — I447 Left bundle-branch block, unspecified: Secondary | ICD-10-CM

## 2014-02-07 DIAGNOSIS — I10 Essential (primary) hypertension: Secondary | ICD-10-CM

## 2014-02-07 NOTE — Patient Instructions (Signed)
Your physician recommends that you continue on your current medications as directed. Please refer to the Current Medication list given to you today.  Your physician wants you to follow-up in: 1 year with an Ekg. You will receive a reminder letter in the mail two months in advance. If you don't receive a letter, please call our office to schedule the follow-up appointment.

## 2014-02-07 NOTE — Assessment & Plan Note (Signed)
She still has left bundle branch block.  Ventricular systolic function is normal.

## 2014-02-07 NOTE — Assessment & Plan Note (Signed)
Continues to very well. Her blood pressure is well controlled. Continue with her same medications.

## 2014-02-07 NOTE — Progress Notes (Signed)
Tonya Haynes Date of Birth  03-Apr-1961 [facility] [address] [place], [place]  [address] [phone]  Fax  [phone]  History of Present Illness:  Tonya Haynes is a middle-aged female who I have been seeing for hypertension. She's done very well. Her last echocardiogram shows normal left ventricular systolic function. She did have moderate left ventricular hypertrophy but this has improved slightly and now she only has mild left ventricular hypertrophy.  She is able to do all of her normal activities without any significant problems.  Current Outpatient Prescriptions on File Prior to Visit  Medication Sig Dispense Refill  . Cholecalciferol (VITAMIN D PO) Take 1,000 Units by mouth daily.      Marland Kitchen lisinopril (PRINIVIL,ZESTRIL) 10 MG tablet Take 1 tablet (10 mg total) by mouth daily.  90 tablet  3  . loratadine (CLARITIN) 10 MG tablet Take 10 mg by mouth daily as needed.       . Multiple Vitamin (MULTI-VITAMIN PO) Take by mouth daily.        . nebivolol (BYSTOLIC) 5 MG tablet Take 1 tablet (5 mg total) by mouth daily.  90 tablet  3   No current facility-administered medications on file prior to visit.    Not on File  Past Medical History  Diagnosis Date  . Hypertension   . LBBB (left bundle branch block)   . Anxiety   . Asthma   . Diastolic dysfunction   . History of basal cell carcinoma     Past Surgical History  Procedure Laterality Date  . Basal cell carcinoma excision      RIGHT DELTOID MUSCLE    History  Smoking status  . Never Smoker   Smokeless tobacco  . Not on file    History  Alcohol Use: Not on file    Family History  Problem Relation Age of Onset  . Hypertension Father   . Breast cancer Mother   . Fibromyalgia Mother     Reviw of Systems:  Reviewed in the HPI.  All other systems are negative.  Physical Exam: BP 124/76  Pulse 80  Ht 5' 8.5" (1.74 m)  Wt 195 lb (88.451 kg)  BMI  29.21 kg/m2 The patient is alert and oriented x 3.  The mood and affect are normal.   Skin: warm and dry.  Color is normal.    HEENT:   the sclera are nonicteric.  The mucous membranes are moist.  The carotids are 2+ without bruits.  There is no thyromegaly.  There is no JVD.    Lungs: clear.  The chest wall is non tender.    Heart: regular rate with a normal S1 and S2.  There are no murmurs, gallops, or rubs. The PMI is not displaced.     Abdomen: good bowel sounds.  There is no guarding or rebound.  There is no hepatosplenomegaly or tenderness.  There are no masses.   Extremities:  no clubbing, cyanosis, or edema.  The legs are without rashes.  The distal pulses are intact.   Neuro:  Cranial nerves II - XII are intact.  Motor and sensory functions are intact.    The gait is normal.  EKG: Normal sinus rhythm at 70 beats a minute. She has a left bundle branch block which is unchanged from previous tracings. Assessment / Plan:

## 2014-02-25 ENCOUNTER — Other Ambulatory Visit: Payer: Self-pay | Admitting: Cardiovascular Disease

## 2014-02-25 MED ORDER — NEBIVOLOL HCL 5 MG PO TABS: 5.0000 mg | ORAL_TABLET | Freq: Every day | ORAL | Status: DC

## 2014-02-25 MED ORDER — LISINOPRIL 10 MG PO TABS: 10.0000 mg | ORAL_TABLET | Freq: Every day | ORAL | Status: DC

## 2014-02-25 NOTE — Progress Notes (Signed)
Receive an email from [facility] said that we had not refilled her Bystolic or Lisinopril Review of the chart indicates that refills were ordered onApril 29 for 90 tabs, 3 refills. I have refilled them again this am.   Ramond Dial., MD, [facility] 02/25/2014, 5:40 AM [address]  [place] Pager [phone]

## 2015-06-05 ENCOUNTER — Ambulatory Visit (INDEPENDENT_AMBULATORY_CARE_PROVIDER_SITE_OTHER): Payer: BC Managed Care – PPO | Admitting: Cardiovascular Disease

## 2015-06-05 ENCOUNTER — Encounter: Payer: Self-pay | Admitting: Cardiovascular Disease

## 2015-06-05 VITALS — BP 160/98 | HR 89 | Ht 68.25 in | Wt 198.8 lb

## 2015-06-05 DIAGNOSIS — I447 Left bundle-branch block, unspecified: Secondary | ICD-10-CM | POA: Diagnosis not present

## 2015-06-05 DIAGNOSIS — I10 Essential (primary) hypertension: Secondary | ICD-10-CM

## 2015-06-05 LAB — BASIC METABOLIC PANEL
BUN: 17 mg/dL (ref 6–23)
CHLORIDE: 106 meq/L (ref 96–112)
CO2: 29 mEq/L (ref 19–32)
CREATININE: 1.13 mg/dL (ref 0.40–1.20)
Calcium: 9.9 mg/dL (ref 8.4–10.5)
GFR: 53.8 mL/min — ABNORMAL LOW (ref >60.00)
Glucose, Bld: 113 mg/dL — ABNORMAL HIGH (ref 70–99)
POTASSIUM: 4.1 meq/L (ref 3.5–5.1)
SODIUM: 143 meq/L (ref 135–145)

## 2015-06-05 MED ORDER — NEBIVOLOL HCL 5 MG PO TABS: 5.0000 mg | ORAL_TABLET | Freq: Every day | ORAL | Status: DC

## 2015-06-05 MED ORDER — LISINOPRIL 10 MG PO TABS: 10.0000 mg | ORAL_TABLET | Freq: Every day | ORAL | Status: DC

## 2015-06-05 NOTE — Progress Notes (Signed)
Tonya Haynes Date of Birth  Dec 23, 1960 [facility] [address] [place], [place]  [address] [phone]  Fax  [phone]  History of Present Illness:  Tonya Haynes is a middle-aged female who I have been seeing for hypertension. She's done very well. Her last echocardiogram shows normal left ventricular systolic function. She did have moderate left ventricular hypertrophy but this has improved slightly and now she only has mild left ventricular hypertrophy.  She is able to do all of her normal activities without any significant problems.  Sept. 2, 2016:  Doing well. No cp, no dyspnea. No complaints.  Not much exercise.  Planning on starting soon. Has been check ing her BP  - readings are all normal  125 / 72   She is very anxious today .    Current Outpatient Prescriptions on File Prior to Visit  Medication Sig Dispense Refill  . Cholecalciferol (VITAMIN D PO) Take 1,000 Units by mouth daily.    Marland Kitchen lisinopril (PRINIVIL,ZESTRIL) 10 MG tablet Take 1 tablet (10 mg total) by mouth daily. 90 tablet 3  . loratadine (CLARITIN) 10 MG tablet Take 10 mg by mouth daily as needed for allergies.     . Multiple Vitamin (MULTI-VITAMIN PO) Take 1 capsule by mouth daily.     . nebivolol (BYSTOLIC) 5 MG tablet Take 1 tablet (5 mg total) by mouth daily. 90 tablet 3   No current facility-administered medications on file prior to visit.    No Known Allergies  Past Medical History  Diagnosis Date  . Hypertension   . LBBB (left bundle branch block)   . Anxiety   . Asthma   . Diastolic dysfunction   . History of basal cell carcinoma     Past Surgical History  Procedure Laterality Date  . Basal cell carcinoma excision      RIGHT DELTOID MUSCLE    History  Smoking status  . Never Smoker   Smokeless tobacco  . Not on file    History  Alcohol Use: Not on file    Family History  Problem Relation Age of Onset  .  Hypertension Father   . Breast cancer Mother   . Fibromyalgia Mother     Reviw of Systems:  Reviewed in the HPI.  All other systems are negative.  Physical Exam: BP 160/98 mmHg  Pulse 89  Ht 5' 8.25" (1.734 m)  Wt 90.175 kg (198 lb 12.8 oz)  BMI 29.99 kg/m2 The patient is alert and oriented x 3.  The mood and affect are normal.   Skin: warm and dry.  Color is normal.    HEENT:   the sclera are nonicteric.  The mucous membranes are moist.  The carotids are 2+ without bruits.  There is no thyromegaly.  There is no JVD.    Lungs: clear.  The chest wall is non tender.    Heart: regular rate with a normal S1 and S2.  There are no murmurs, gallops, or rubs. The PMI is not displaced.     Abdomen: good bowel sounds.  There is no guarding or rebound.  There is no hepatosplenomegaly or tenderness.  There are no masses.   Extremities:  no clubbing, cyanosis, or edema.  The legs are without rashes.  The distal pulses are intact.   Neuro:  Cranial nerves II - XII are intact.  Motor and sensory functions are intact.    The gait is normal.  EKG: Septra second, 2060: Normal sinus rhythm at 89. Left bundle branch block: No changes from previous tracing. Assessment / Plan:   1. Hypertension: Continue current medications. Her blood pressure is elevated today her blood pressure readings at home are normal.  2. Left bundle branch block: Patient has had a left bundle-branch block for the past 5 years. Her ejection fraction was around 50% and her last echocardiogram in 2011 oh 2012. We will repeat her echocardiogram in a week or so. Continue current medications.    Traeh Milroy, Wonda Cheng, MD  06/05/2015 8:52 AM    [address],  [place], [place]  [address] Pager [phone] Phone: [phone]; Fax: [phone]   [facility]  [address] [place], [place]  [address] [phone]   Fax [phone]

## 2015-06-05 NOTE — Patient Instructions (Addendum)
Medication Instructions:  Your physician recommends that you continue on your current medications as directed. Please refer to the Current Medication list given to you today.   Labwork: TODAY - basic metabolic panel   Testing/Procedures: Your physician has requested that you have an echocardiogram. Echocardiography is a painless test that uses sound waves to create images of your heart. It provides your doctor with information about the size and shape of your heart and how well your heart's chambers and valves are working. This procedure takes approximately one hour. There are no restrictions for this procedure.    Follow-Up: Your physician wants you to follow-up in: 1 year with Dr. Acie Fredrickson.  You will receive a reminder letter in the mail two months in advance. If you don't receive a letter, please call our office to schedule the follow-up appointment.

## 2015-06-11 ENCOUNTER — Other Ambulatory Visit: Payer: BC Managed Care – PPO

## 2015-06-16 ENCOUNTER — Other Ambulatory Visit: Payer: BC Managed Care – PPO

## 2015-07-02 ENCOUNTER — Other Ambulatory Visit (HOSPITAL_COMMUNITY): Payer: BC Managed Care – PPO

## 2015-07-02 ENCOUNTER — Other Ambulatory Visit: Payer: BC Managed Care – PPO

## 2015-07-09 ENCOUNTER — Ambulatory Visit (HOSPITAL_COMMUNITY): Payer: BC Managed Care – PPO | Attending: Cardiovascular Disease

## 2015-07-09 ENCOUNTER — Other Ambulatory Visit: Payer: Self-pay

## 2015-07-09 DIAGNOSIS — I340 Nonrheumatic mitral (valve) insufficiency: Secondary | ICD-10-CM | POA: Insufficient documentation

## 2015-07-09 DIAGNOSIS — I517 Cardiomegaly: Secondary | ICD-10-CM | POA: Insufficient documentation

## 2015-07-09 DIAGNOSIS — I447 Left bundle-branch block, unspecified: Secondary | ICD-10-CM | POA: Diagnosis not present

## 2015-07-09 DIAGNOSIS — I10 Essential (primary) hypertension: Secondary | ICD-10-CM | POA: Insufficient documentation

## 2016-06-30 ENCOUNTER — Other Ambulatory Visit: Payer: Self-pay | Admitting: Cardiovascular Disease

## 2016-10-28 ENCOUNTER — Other Ambulatory Visit: Payer: Self-pay | Admitting: *Deleted

## 2016-10-28 MED ORDER — LISINOPRIL 10 MG PO TABS: 10.0000 mg | ORAL_TABLET | Freq: Every day | ORAL | 0 refills | Status: DC

## 2016-10-28 MED ORDER — NEBIVOLOL HCL 5 MG PO TABS: 5.0000 mg | ORAL_TABLET | Freq: Every day | ORAL | 0 refills | Status: DC

## 2016-12-02 ENCOUNTER — Encounter: Payer: Self-pay | Admitting: Cardiovascular Disease

## 2016-12-08 ENCOUNTER — Other Ambulatory Visit: Payer: Self-pay | Admitting: *Deleted

## 2016-12-08 MED ORDER — LISINOPRIL 10 MG PO TABS: 10.0000 mg | ORAL_TABLET | Freq: Every day | ORAL | 0 refills | Status: DC

## 2016-12-08 MED ORDER — NEBIVOLOL HCL 5 MG PO TABS: 5.0000 mg | ORAL_TABLET | Freq: Every day | ORAL | 0 refills | Status: DC

## 2016-12-12 ENCOUNTER — Ambulatory Visit (INDEPENDENT_AMBULATORY_CARE_PROVIDER_SITE_OTHER): Payer: BC Managed Care – PPO | Admitting: Cardiovascular Disease

## 2016-12-12 ENCOUNTER — Encounter: Payer: Self-pay | Admitting: Cardiovascular Disease

## 2016-12-12 ENCOUNTER — Encounter (INDEPENDENT_AMBULATORY_CARE_PROVIDER_SITE_OTHER): Payer: Self-pay

## 2016-12-12 VITALS — BP 160/110 | HR 77 | Ht 68.0 in | Wt 214.8 lb

## 2016-12-12 DIAGNOSIS — I447 Left bundle-branch block, unspecified: Secondary | ICD-10-CM | POA: Diagnosis not present

## 2016-12-12 DIAGNOSIS — Z0000 Encounter for general adult medical examination without abnormal findings: Secondary | ICD-10-CM | POA: Diagnosis not present

## 2016-12-12 DIAGNOSIS — I10 Essential (primary) hypertension: Secondary | ICD-10-CM

## 2016-12-12 NOTE — Patient Instructions (Addendum)
Your physician recommends that you continue on your current medications as directed. Please refer to the Current Medication list given to you today. Your physician recommends that you return for lab work in:  Monday   BMET [place]   Your physician recommends that you schedule a follow-up appointment in:  [place]   Acie Fredrickson

## 2016-12-12 NOTE — Progress Notes (Signed)
Tonya Haynes Date of Birth  April 21, 1961 [facility] / [facility] [address] [place], [place]  [address] [phone]  Fax  [phone]  History of Present Illness:  Tonya Haynes is a middle-aged female who I have been seeing for hypertension. She's done very well. Her last echocardiogram shows normal left ventricular systolic function. She did have moderate left ventricular hypertrophy but this has improved slightly and now she only has mild left ventricular hypertrophy.  She is able to do all of her normal activities without any significant problems.  Sept. 2, 2016:  Doing well. No cp, no dyspnea. No complaints.  Not much exercise.  Planning on starting soon. Has been check ing her BP  - readings are all normal  125 / 72   She is very anxious today .  December 12, 2016:  Tonya Haynes is seen today for follow up visit  BP at home is good.    Last echo in 2016 shows normal LV function .  Is planning on walking some   Current Outpatient Prescriptions on File Prior to Visit  Medication Sig Dispense Refill  . Cholecalciferol (VITAMIN D PO) Take 1,000 Units by mouth daily.    . fexofenadine (ALLEGRA) 180 MG tablet Take 180 mg by mouth daily as needed for allergies. Alternates with Claritin    . lisinopril (PRINIVIL,ZESTRIL) 10 MG tablet Take 1 tablet (10 mg total) by mouth daily. *Patient needs to keep 12/12/16 appointment for further refills* 30 tablet 0  . loratadine (CLARITIN) 10 MG tablet Take 10 mg by mouth daily as needed for allergies.     . Multiple Vitamin (MULTI-VITAMIN PO) Take 1 capsule by mouth daily.     . nebivolol (BYSTOLIC) 5 MG tablet Take 1 tablet (5 mg total) by mouth daily. *Patient needs to keep 12/12/16 appointment for further refills* 30 tablet 0   No current facility-administered medications on file prior to visit.     No Known Allergies  Past Medical History:  Diagnosis Date  . Anxiety   . Asthma   . Diastolic  dysfunction   . History of basal cell carcinoma   . Hypertension   . LBBB (left bundle branch block)     Past Surgical History:  Procedure Laterality Date  . BASAL CELL CARCINOMA EXCISION     RIGHT DELTOID MUSCLE    History  Smoking Status  . Never Smoker  Smokeless Tobacco  . Never Used    History  Alcohol Use No    Family History  Problem Relation Age of Onset  . Hypertension Father   . Breast cancer Mother   . Fibromyalgia Mother     Reviw of Systems:  Reviewed in the HPI.  All other systems are negative.  Physical Exam: BP (!) 160/110 (BP Location: Left Arm, Patient Position: Sitting, Cuff Size: Large)   Pulse 77   Ht 5\' 8"  (1.727 m)   Wt 214 lb 12.8 oz (97.4 kg)   SpO2 98%   BMI 32.66 kg/m  The patient is alert and oriented x 3.  The mood and affect are normal.   Skin: warm and dry.  Color is normal.    HEENT:   the sclera are nonicteric.  The mucous membranes are moist.  The carotids are 2+ without bruits.  There is no thyromegaly.  There is no JVD.    Lungs: clear.  The chest wall is non tender.    Heart: regular rate with  a normal S1 and S2.  There are no murmurs, gallops, or rubs. The PMI is not displaced.     Abdomen: good bowel sounds.  There is no guarding or rebound.  There is no hepatosplenomegaly or tenderness.  There are no masses.   Extremities:  no clubbing, cyanosis, or edema.  The legs are without rashes.  The distal pulses are intact.   Neuro:  Cranial nerves II - XII are intact.  Motor and sensory functions are intact.    The gait is normal.  EKG:  December 12, 2016:   NSR at 26.   LBBB  Assessment / Plan:   1. Hypertension: Continue current medications. Her blood pressure is elevated today her blood pressure readings at home are normal. She will send in her BP readings   2. Left bundle branch block: Patient has had a left bundle-branch block for the past 7 years.    3. Chronic systolic CHF:  EF has improved .   Continue meds.      Mertie Moores, MD  12/12/2016 1:56 PM    [place],  [place], [place]  [address] Pager [phone] Phone: [phone]; Fax: [phone]

## 2016-12-19 ENCOUNTER — Other Ambulatory Visit: Payer: BC Managed Care – PPO

## 2017-01-04 ENCOUNTER — Other Ambulatory Visit: Payer: Self-pay | Admitting: Cardiovascular Disease

## 2018-01-25 ENCOUNTER — Encounter: Payer: Self-pay | Admitting: Cardiovascular Disease

## 2018-02-01 ENCOUNTER — Other Ambulatory Visit: Payer: Self-pay | Admitting: Cardiovascular Disease

## 2018-02-02 ENCOUNTER — Other Ambulatory Visit: Payer: Self-pay | Admitting: Cardiovascular Disease

## 2018-02-11 NOTE — Progress Notes (Signed)
Tonya Haynes Date of Birth  [birth year] [facility] / [facility] [address] [place], [place]  [address] [phone]  Fax  [phone]    Tonya Haynes is a middle-aged female who I have been seeing for hypertension. She's done very well. Her last echocardiogram shows normal left ventricular systolic function. She did have moderate left ventricular hypertrophy but this has improved slightly and now she only has mild left ventricular hypertrophy.  She is able to do all of her normal activities without any significant problems.  Sept. 2, 2016:  Doing well. No cp, no dyspnea. No complaints.  Not much exercise.  Planning on starting soon. Has been check ing her BP  - readings are all normal  125 / 72   She is very anxious today .  December 12, 2016:  Tonya Haynes is seen today for follow up visit  BP at home is good.    Last echo in 2016 shows normal LV function .  Is planning on walking some   Feb 12, 2018:  Tonya Haynes is seen today for follow up of her HTN,LBBB and CHF Doing well .    BP has been good .   No cp , breathing is good .     Current Outpatient Medications on File Prior to Visit  Medication Sig Dispense Refill  . Cholecalciferol (VITAMIN D PO) Take 1,000 Units by mouth daily.    . fexofenadine (ALLEGRA) 180 MG tablet Take 180 mg by mouth daily as needed for allergies. Alternates with Claritin    . lisinopril (PRINIVIL,ZESTRIL) 10 MG tablet Take 1 tablet (10 mg total) by mouth daily. Please keep upcoming appt for future refills. Thank you 90 tablet 0  . loratadine (CLARITIN) 10 MG tablet Take 10 mg by mouth daily as needed for allergies.     . Multiple Vitamin (MULTI-VITAMIN PO) Take 1 capsule by mouth daily.     . nebivolol (BYSTOLIC) 5 MG tablet Take 1 tablet (5 mg total) by mouth daily. Please keep upcoming appt for future refills. Thank you 90 tablet 0   No current facility-administered medications on file prior to visit.      No Known Allergies  Past Medical History:  Diagnosis Date  . Anxiety   . Asthma   . Diastolic dysfunction   . History of basal cell carcinoma   . Hypertension   . LBBB (left bundle branch block)     Past Surgical History:  Procedure Laterality Date  . BASAL CELL CARCINOMA EXCISION     RIGHT DELTOID MUSCLE    Social History   Tobacco Use  Smoking Status Never Smoker  Smokeless Tobacco Never Used    Social History   Substance and Sexual Activity  Alcohol Use No    Family History  Problem Relation Age of Onset  . Hypertension Father   . Breast cancer Mother   . Fibromyalgia Mother     Reviw of Systems:  Reviewed in the HPI.  All other systems are negative.  Physical Exam: There were no vitals taken for this visit.  GEN:  Well nourished, well developed in no acute distress HEENT: Normal NECK: No JVD; No carotid bruits LYMPHATICS: No lymphadenopathy CARDIAC: RRR  RESPIRATORY:  Clear to auscultation without rales, wheezing or rhonchi  ABDOMEN: Soft, non-tender, non-distended MUSCULOSKELETAL:  No edema; No deformity  SKIN: Warm and dry NEUROLOGIC:  Alert and oriented x 3   EKG:    Feb 12, 2018:  NSR at 90.   LBBB. No changes from previous ECG   Assessment / Plan:   1. Hypertension: Blood pressures well controlled.  Continue current medications.  2. Left bundle branch block: She remains basically a symptom medic.  I encouraged her to start a walking program.  3. Chronic systolic CHF: Last echocardiogram in 2016 revealed normal left ventricular systolic function.  We will continue to follow her clinically.  We discussed the fact that left bundle branch block can be associated with worsening LV function.  I have encouraged her to ambulate on a regular basis.   Mertie Moores, MD  02/11/2018 9:06 PM    [place],  [place], [place]  [address] Pager [phone] Phone: [phone]; Fax: [phone]

## 2018-02-12 ENCOUNTER — Encounter (INDEPENDENT_AMBULATORY_CARE_PROVIDER_SITE_OTHER): Payer: Self-pay

## 2018-02-12 ENCOUNTER — Encounter: Payer: Self-pay | Admitting: Cardiovascular Disease

## 2018-02-12 ENCOUNTER — Ambulatory Visit: Payer: BC Managed Care – PPO | Admitting: Cardiovascular Disease

## 2018-02-12 VITALS — BP 130/92 | HR 90 | Ht 68.6 in | Wt 213.0 lb

## 2018-02-12 DIAGNOSIS — I10 Essential (primary) hypertension: Secondary | ICD-10-CM | POA: Diagnosis not present

## 2018-02-12 DIAGNOSIS — I447 Left bundle-branch block, unspecified: Secondary | ICD-10-CM | POA: Diagnosis not present

## 2018-02-12 LAB — HEPATIC FUNCTION PANEL
ALK PHOS: 91 IU/L (ref 39–117)
ALT: 17 IU/L (ref 0–32)
AST: 22 IU/L (ref 0–40)
Albumin: 4.8 g/dL (ref 3.5–5.5)
BILIRUBIN TOTAL: 0.4 mg/dL (ref 0.0–1.2)
BILIRUBIN, DIRECT: 0.12 mg/dL (ref 0.00–0.40)
Total Protein: 7.3 g/dL (ref 6.0–8.5)

## 2018-02-12 LAB — LIPID PANEL
CHOL/HDL RATIO: 3.5 ratio (ref 0.0–4.4)
Cholesterol, Total: 202 mg/dL — ABNORMAL HIGH (ref 100–199)
HDL: 57 mg/dL (ref >39)
LDL Calculated: 116 mg/dL — ABNORMAL HIGH (ref 0–99)
Triglycerides: 147 mg/dL (ref 0–149)
VLDL Cholesterol Cal: 29 mg/dL (ref 5–40)

## 2018-02-12 LAB — BASIC METABOLIC PANEL
BUN/Creatinine Ratio: 13 (ref 9–23)
BUN: 16 mg/dL (ref 6–24)
CO2: 22 mmol/L (ref 20–29)
Calcium: 9.6 mg/dL (ref 8.7–10.2)
Chloride: 104 mmol/L (ref 96–106)
Creatinine, Ser: 1.21 mg/dL — ABNORMAL HIGH (ref 0.57–1.00)
GFR, EST AFRICAN AMERICAN: 59 mL/min/{1.73_m2} — AB (ref >59)
GFR, EST NON AFRICAN AMERICAN: 51 mL/min/{1.73_m2} — AB (ref >59)
Glucose: 114 mg/dL — ABNORMAL HIGH (ref 65–99)
POTASSIUM: 4.4 mmol/L (ref 3.5–5.2)
Sodium: 141 mmol/L (ref 134–144)

## 2018-02-12 LAB — TSH: TSH: 2.26 u[IU]/mL (ref 0.450–4.500)

## 2018-02-12 MED ORDER — NEBIVOLOL HCL 5 MG PO TABS: 5.0000 mg | ORAL_TABLET | Freq: Every day | ORAL | 3 refills | Status: DC

## 2018-02-12 MED ORDER — LISINOPRIL 10 MG PO TABS: 10.0000 mg | ORAL_TABLET | Freq: Every day | ORAL | 3 refills | Status: DC

## 2018-02-12 NOTE — Patient Instructions (Signed)
Medication Instructions:  Your physician recommends that you continue on your current medications as directed. Please refer to the Current Medication list given to you today.   Labwork: TODAY - cholesterol, liver panel, TSH, BMET   Testing/Procedures: None Ordered   Follow-Up: Your physician wants you to follow-up in: 1 year with Dr. Acie Fredrickson. You will receive a reminder letter in the mail two months in advance. If you don't receive a letter, please call our office to schedule the follow-up appointment.   If you need a refill on your cardiac medications before your next appointment, please call your pharmacy.   Thank you for choosing [facility]! Christen Bame, RN [phone]

## 2018-05-22 ENCOUNTER — Other Ambulatory Visit: Payer: Self-pay | Admitting: Cardiovascular Disease

## 2019-02-19 ENCOUNTER — Other Ambulatory Visit: Payer: Self-pay | Admitting: Cardiovascular Disease

## 2019-02-20 ENCOUNTER — Other Ambulatory Visit: Payer: Self-pay | Admitting: Cardiovascular Disease

## 2019-03-06 ENCOUNTER — Telehealth: Payer: Self-pay | Admitting: Nurse Practitioner

## 2019-03-06 DIAGNOSIS — I340 Nonrheumatic mitral (valve) insufficiency: Secondary | ICD-10-CM

## 2019-03-06 DIAGNOSIS — I447 Left bundle-branch block, unspecified: Secondary | ICD-10-CM

## 2019-03-06 NOTE — Telephone Encounter (Signed)
Left message for patient to call back regarding follow-up echo and appointment with Dr. Acie Fredrickson

## 2019-03-21 ENCOUNTER — Other Ambulatory Visit: Payer: Self-pay | Admitting: Cardiovascular Disease

## 2019-04-01 NOTE — Telephone Encounter (Signed)
Left message for patient to call back per Dr. Elmarie Shiley request to schedule echo and follow-up

## 2019-04-21 ENCOUNTER — Other Ambulatory Visit: Payer: Self-pay | Admitting: Cardiovascular Disease

## 2019-04-26 NOTE — Telephone Encounter (Signed)
Follow up  Patient calling back after receiving your voicemail. I scheduled her follow up appointment with Dr. Acie Fredrickson on 07/01/19 at 3:20pm. I didn't see the request for the echo in the system. Could you give patient a call back to schedule the echo?

## 2019-05-01 NOTE — Telephone Encounter (Signed)
Echo order placed in epic and message sent to scheduling to schedule echo prior to 9/28 appointment with Dr. Acie Fredrickson

## 2019-05-05 ENCOUNTER — Other Ambulatory Visit: Payer: Self-pay | Admitting: Cardiovascular Disease

## 2019-05-21 ENCOUNTER — Other Ambulatory Visit: Payer: Self-pay | Admitting: Cardiovascular Disease

## 2019-06-21 ENCOUNTER — Telehealth (HOSPITAL_COMMUNITY): Payer: Self-pay

## 2019-06-21 NOTE — Telephone Encounter (Signed)
New message    Just an FYI. We have made several attempts to contact this patient including sending a letter to schedule or reschedule their echocardiogram. We will be removing the patient from the echo [place].   9.10.20 @ 12:25pm lm on home vm Tonya Haynes  8.24.20 @ 2:42pm both #  are the same lm on home vm Tonya Haynes  7.29.20 @ 11:50am lm on home vm - Tonya Haynes

## 2019-06-30 NOTE — Progress Notes (Signed)
Tonya Haynes Date of Birth  04/08/1961 [facility] / [facility] [id number] [place], [place]  [address] [phone]  Fax  [phone]    Tonya Haynes is a middle-aged female who I have been seeing for hypertension. She's done very well. Her last echocardiogram shows normal left ventricular systolic function. She did have moderate left ventricular hypertrophy but this has improved slightly and now she only has mild left ventricular hypertrophy.  She is able to do all of her normal activities without any significant problems.  Sept. 2, 2016:  Doing well. No cp, no dyspnea. No complaints.  Not much exercise.  Planning on starting soon. Has been check ing her BP  - readings are all normal  125 / 72   She is very anxious today .  December 12, 2016:  Tonya Haynes is seen today for follow up visit  BP at home is good.    Last echo in 2016 shows normal LV function .  Is planning on walking some   Feb 12, 2018:  Tonya Haynes is seen today for follow up of her HTN,LBBB and CHF Doing well .    BP has been good .   No cp , breathing is good .   Sept. 28, 2020  Tonya Haynes is seen for follow up of her HTN , LBBB  Grade 1 diastolic CHF Works is going ok  Does not exercise much  Does not like to sweat.   Current Outpatient Medications on File Prior to Visit  Medication Sig Dispense Refill  . Cetirizine HCl (ZYRTEC ALLERGY PO) Take 1 tablet by mouth as needed.     . Cholecalciferol (VITAMIN D PO) Take 1,000 Units by mouth daily.    Marland Kitchen lisinopril (ZESTRIL) 10 MG tablet TAKE 1 TABLET BY MOUTH EVERY DAY. Please keep upcoming appt for future refills. Thank you 30 tablet 1  . Multiple Vitamin (MULTI-VITAMIN PO) Take 1 capsule by mouth daily.      No current facility-administered medications on file prior to visit.     No Known Allergies  Past Medical History:  Diagnosis Date  . Anxiety   . Asthma   . Diastolic dysfunction   . History of basal cell  carcinoma   . Hypertension   . LBBB (left bundle branch block)     Past Surgical History:  Procedure Laterality Date  . BASAL CELL CARCINOMA EXCISION     RIGHT DELTOID MUSCLE    Social History   Tobacco Use  Smoking Status Never Smoker  Smokeless Tobacco Never Used    Social History   Substance and Sexual Activity  Alcohol Use No    Family History  Problem Relation Age of Onset  . Hypertension Father   . Breast cancer Mother   . Fibromyalgia Mother     Reviw of Systems:  Reviewed in the HPI.  All other systems are negative.  Physical Exam: Blood pressure (!) 140/110, pulse (!) 101, height 5\' 8"  (1.727 m), weight 215 lb 12.8 oz (97.9 kg), SpO2 94 %.  GEN:  Middle age female,  NAD  HEENT: Normal NECK: No JVD; No carotid bruits LYMPHATICS: No lymphadenopathy CARDIAC: RRR   RESPIRATORY:  Clear to auscultation without rales, wheezing or rhonchi  ABDOMEN: Soft, non-tender, non-distended MUSCULOSKELETAL:  No edema; No deformity  SKIN: Warm and dry NEUROLOGIC:  Alert and oriented x 3    EKG:    July 01, 2019: Sinus tachycardia at 101 bpm.  Left axis deviation.  Left bundle branch block.  Assessment / Plan:   1. Hypertension: Blood pressure remains elevated.  Especially her diastolic pressure.  We will increase the Bystolic to 10 mg a day.  She will add HCTZ 25 mg tablets that she is to take 1/2 tablet a day starting in about 2 weeks.  She will also start potassium chloride 10 mEq with the HCTZ.  She will keep a blood pressure log and she will send [place] blood pressure readings after about 2 to 3 weeks.  I have encouraged her to start a walking program.  Of asked her to walk to the point where she is breaking a sweat and getting her heart rate up.  BMP 2-3 weeks after starting the HCTZ and Kdur .   2. Left bundle branch block: Stable.  3. Chronic systolic CHF: Last echocardiogram was October, 2016 which revealed an ejection fraction of 50 to 55%.  We will  continue to keep a close eye on this.  We discussed having her get an echocardiogram in [place] at some point.   Mertie Moores, MD  07/01/2019 5:32 PM    [place],  [place], [place]  [address] Pager [phone] Phone: [phone]; Fax: [phone]

## 2019-07-01 ENCOUNTER — Ambulatory Visit: Payer: BC Managed Care – PPO | Admitting: Cardiovascular Disease

## 2019-07-01 ENCOUNTER — Encounter: Payer: Self-pay | Admitting: Cardiovascular Disease

## 2019-07-01 ENCOUNTER — Other Ambulatory Visit: Payer: Self-pay

## 2019-07-01 VITALS — BP 140/110 | HR 101 | Ht 68.0 in | Wt 215.8 lb

## 2019-07-01 DIAGNOSIS — R000 Tachycardia, unspecified: Secondary | ICD-10-CM

## 2019-07-01 DIAGNOSIS — I447 Left bundle-branch block, unspecified: Secondary | ICD-10-CM

## 2019-07-01 DIAGNOSIS — I10 Essential (primary) hypertension: Secondary | ICD-10-CM | POA: Diagnosis not present

## 2019-07-01 DIAGNOSIS — I340 Nonrheumatic mitral (valve) insufficiency: Secondary | ICD-10-CM | POA: Diagnosis not present

## 2019-07-01 MED ORDER — POTASSIUM CHLORIDE ER 10 MEQ PO TBCR: 10.0000 meq | EXTENDED_RELEASE_TABLET | Freq: Every day | ORAL | 11 refills | Status: DC

## 2019-07-01 MED ORDER — HYDROCHLOROTHIAZIDE 25 MG PO TABS: 25.0000 mg | ORAL_TABLET | Freq: Every day | ORAL | 11 refills | Status: DC

## 2019-07-01 MED ORDER — NEBIVOLOL HCL 10 MG PO TABS: 10.0000 mg | ORAL_TABLET | Freq: Every day | ORAL | 11 refills | Status: DC

## 2019-07-01 NOTE — Patient Instructions (Addendum)
Medication Instructions:  Your physician has recommended you make the following change in your medication:  Increase her Bystolic to 10 mg a day starting on Tuesday.  Record your blood pressure and heart rate on a notepad each day. Call or send [place] an email in about 2 weeks so that we can review your heart rate and blood pressure. START HCTZ (hydrochlorothiazide) 12.5 mg (1/2 tab) and K-Dur (potassium chloride) 10 mEq once daily I will instruct you to start the HCTZ and potassium at that time if your blood pressure  and heart rate look okay.  If you need a refill on your cardiac medications before your next appointment, please call your pharmacy.   Lab work: Your physician recommends that you return for lab work in: 2-3 weeks on Wednesday October 14. You may come in anytime between 7:30 am and 4:45 pm. You do not have to fast  Testing/Procedures: None Ordered   Follow-Up: Your physician recommends that you return for a follow-up appointment with Dr. Acie Fredrickson on December 17

## 2019-07-08 ENCOUNTER — Other Ambulatory Visit: Payer: Self-pay | Admitting: Cardiovascular Disease

## 2019-07-17 ENCOUNTER — Other Ambulatory Visit: Payer: BC Managed Care – PPO | Admitting: *Deleted

## 2019-07-17 ENCOUNTER — Other Ambulatory Visit: Payer: Self-pay

## 2019-07-17 DIAGNOSIS — I340 Nonrheumatic mitral (valve) insufficiency: Secondary | ICD-10-CM

## 2019-07-17 DIAGNOSIS — I10 Essential (primary) hypertension: Secondary | ICD-10-CM

## 2019-07-17 DIAGNOSIS — R000 Tachycardia, unspecified: Secondary | ICD-10-CM

## 2019-07-17 DIAGNOSIS — I447 Left bundle-branch block, unspecified: Secondary | ICD-10-CM

## 2019-07-18 LAB — BASIC METABOLIC PANEL
BUN/Creatinine Ratio: 15 (ref 9–23)
BUN: 15 mg/dL (ref 6–24)
CO2: 23 mmol/L (ref 20–29)
Calcium: 9.5 mg/dL (ref 8.7–10.2)
Chloride: 103 mmol/L (ref 96–106)
Creatinine, Ser: 1.02 mg/dL — ABNORMAL HIGH (ref 0.57–1.00)
GFR calc Af Amer: 72 mL/min/{1.73_m2} (ref >59)
GFR calc non Af Amer: 62 mL/min/{1.73_m2} (ref >59)
Glucose: 100 mg/dL — ABNORMAL HIGH (ref 65–99)
Potassium: 4.5 mmol/L (ref 3.5–5.2)
Sodium: 140 mmol/L (ref 134–144)

## 2019-08-24 ENCOUNTER — Other Ambulatory Visit: Payer: Self-pay | Admitting: Cardiovascular Disease

## 2019-08-26 NOTE — Telephone Encounter (Signed)
Outpatient Medication Detail   Disp Refills Start End   nebivolol (BYSTOLIC) 10 MG tablet 30 tablet 11 07/01/2019    Sig - Route: Take 1 tablet (10 mg total) by mouth daily. - Oral   Sent to pharmacy as: nebivolol (BYSTOLIC) 10 MG tablet   E-Prescribing Status: Receipt confirmed by pharmacy (07/01/2019 4:01 PM EDT)   Pharmacy  [facility] [id number] - [place], [place]

## 2019-09-19 ENCOUNTER — Encounter: Payer: Self-pay | Admitting: Cardiovascular Disease

## 2019-09-19 ENCOUNTER — Ambulatory Visit: Payer: BC Managed Care – PPO | Admitting: Cardiovascular Disease

## 2019-09-19 ENCOUNTER — Other Ambulatory Visit: Payer: Self-pay

## 2019-09-19 VITALS — BP 142/96 | HR 94 | Ht 68.0 in | Wt 211.4 lb

## 2019-09-19 DIAGNOSIS — I447 Left bundle-branch block, unspecified: Secondary | ICD-10-CM | POA: Diagnosis not present

## 2019-09-19 DIAGNOSIS — I10 Essential (primary) hypertension: Secondary | ICD-10-CM

## 2019-09-19 MED ORDER — POTASSIUM CHLORIDE ER 10 MEQ PO TBCR: 10.0000 meq | EXTENDED_RELEASE_TABLET | Freq: Every day | ORAL | 11 refills | Status: DC

## 2019-09-19 MED ORDER — HYDROCHLOROTHIAZIDE 25 MG PO TABS: 25.0000 mg | ORAL_TABLET | Freq: Every day | ORAL | 11 refills | Status: DC

## 2019-09-19 NOTE — Patient Instructions (Signed)
Medication Instructions:  1) START HCTZ 25 mg daily (take a half tablet for the first week)  2) Start KDUR 10 meq daily  Labwork: You will have lab work in 3 weeks. You do not need to be fasting.   Follow-Up: You will have a blood pressure check in 3 weeks. Please bring your blood pressure cuff so we can compare readings.   Your provider recommends that you schedule a follow-up appointment in 3 months.

## 2019-09-19 NOTE — Progress Notes (Signed)
Jenisha Mcclaskey Date of Birth  April 18, 1961 [facility] / [facility] [id number] [place], [place]  [address] [phone]  Fax  [phone]    [name] is a middle-aged female who I have been seeing for hypertension. She's done very well. Her last echocardiogram shows normal left ventricular systolic function. She did have moderate left ventricular hypertrophy but this has improved slightly and now she only has mild left ventricular hypertrophy.  She is able to do all of her normal activities without any significant problems.  Sept. 2, 2016:  Doing well. No cp, no dyspnea. No complaints.  Not much exercise.  Planning on starting soon. Has been check ing her BP  - readings are all normal  125 / 72   She is very anxious today .  December 12, 2016:  [name] is seen today for follow up visit  BP at home is good.    Last echo in 2016 shows normal LV function .  Is planning on walking some   Feb 12, 2018:  [name] is seen today for follow up of her HTN,LBBB and CHF Doing well .    BP has been good .   No cp , breathing is good .   Sept. 28, 2020  [name] is seen for follow up of her HTN , LBBB  Grade 1 diastolic CHF Works is going ok  Does not exercise much  Does not like to sweat.   Dec. 17, 2020  [name] is seen today for follow up visit. Is taking Bystolic 10 mg a day , Has not started the HCTZ and KCl . Is walking some - walks 45 min. 3 days a week.    No CP or dyspnea.   We will start the hydrochlorothiazide and potassium today.  She will continue to monitor blood pressure readings.  We will check a basic metabolic profile in 3 weeks and I will see her again in 3 months.    Current Outpatient Medications on File Prior to Visit  Medication Sig Dispense Refill  . Cetirizine HCl (ZYRTEC ALLERGY PO) Take 1 tablet by mouth as needed.     . Cholecalciferol (VITAMIN D PO) Take 1,000 Units by mouth daily.    Marland Kitchen lisinopril (ZESTRIL) 10  MG tablet TAKE 1 TABLET BY MOUTH EVERY DAY 30 tablet 11  . Multiple Vitamin (MULTI-VITAMIN PO) Take 1 capsule by mouth daily.     . nebivolol (BYSTOLIC) 10 MG tablet Take 1 tablet (10 mg total) by mouth daily. 30 tablet 11   No current facility-administered medications on file prior to visit.    No Known Allergies  Past Medical History:  Diagnosis Date  . Anxiety   . Asthma   . Diastolic dysfunction   . History of basal cell carcinoma   . Hypertension   . LBBB (left bundle branch block)     Past Surgical History:  Procedure Laterality Date  . BASAL CELL CARCINOMA EXCISION     RIGHT DELTOID MUSCLE    Social History   Tobacco Use  Smoking Status Never Smoker  Smokeless Tobacco Never Used    Social History   Substance and Sexual Activity  Alcohol Use No    Family History  Problem Relation Age of Onset  . Hypertension Father   . Breast cancer Mother   . Fibromyalgia Mother     Reviw of Systems:  Reviewed in the HPI.  All other systems are negative.  Physical Exam: Blood pressure (!) 142/96, pulse 94, height 5\' 8"  (1.727 m), weight 211 lb 6.4 oz (95.9 kg), SpO2 98 %.  GEN:  Well nourished, well developed in no acute distress HEENT: Normal NECK: No JVD; No carotid bruits LYMPHATICS: No lymphadenopathy CARDIAC: RRR , no murmurs, rubs, gallops RESPIRATORY:  Clear to auscultation without rales, wheezing or rhonchi  ABDOMEN: Soft, non-tender, non-distended MUSCULOSKELETAL:  No edema; No deformity  SKIN: Warm and dry NEUROLOGIC:  Alert and oriented x 3     EKG:      Assessment / Plan:   1. Hypertension: We will start the hydrochlorothiazide and potassium today.  She will continue to monitor blood pressure readings.  We will check a basic metabolic profile in 3 weeks and I will see her again in 3 months.  Overall her blood pressure readings look good from home.  The blood pressure is a little elevated today.  .   2. Left bundle branch block: Stable.   Continue medications.     Mertie Moores, MD  09/19/2019 8:34 AM    [place],  [place], [place]  [address] Pager [phone] Phone: [phone]; Fax: [phone]

## 2019-10-10 ENCOUNTER — Ambulatory Visit: Payer: BC Managed Care – PPO

## 2019-10-11 ENCOUNTER — Other Ambulatory Visit: Payer: Self-pay

## 2019-10-11 ENCOUNTER — Ambulatory Visit: Payer: BC Managed Care – PPO | Admitting: Nurse Practitioner

## 2019-10-11 DIAGNOSIS — I10 Essential (primary) hypertension: Secondary | ICD-10-CM

## 2019-10-11 LAB — BASIC METABOLIC PANEL
BUN/Creatinine Ratio: 19 (ref 9–23)
BUN: 20 mg/dL (ref 6–24)
CO2: 23 mmol/L (ref 20–29)
Calcium: 9.8 mg/dL (ref 8.7–10.2)
Chloride: 100 mmol/L (ref 96–106)
Creatinine, Ser: 1.07 mg/dL — ABNORMAL HIGH (ref 0.57–1.00)
GFR calc Af Amer: 67 mL/min/{1.73_m2} (ref >59)
GFR calc non Af Amer: 58 mL/min/{1.73_m2} — ABNORMAL LOW (ref >59)
Glucose: 102 mg/dL — ABNORMAL HIGH (ref 65–99)
Potassium: 4.5 mmol/L (ref 3.5–5.2)
Sodium: 138 mmol/L (ref 134–144)

## 2019-10-11 NOTE — Patient Instructions (Addendum)
Medication Instructions:  Your physician recommends that you continue on your current medications as directed. Please refer to the Current Medication list given to you today.  *If you need a refill on your cardiac medications before your next appointment, please call your pharmacy*  Lab Work: TODAY - BMET If you have labs (blood work) drawn today and your tests are completely normal, you will receive your results only by: Marland Kitchen MyChart Message (if you have MyChart) OR . A paper copy in the mail If you have any lab test that is abnormal or we need to change your treatment, we will call you to review the results.  Testing/Procedures: None  Follow-Up: At [facility], you and your health needs are our priority.  As part of our continuing mission to provide you with exceptional heart care, we have created designated Provider Care Teams.  These Care Teams include your primary Cardiologist (physician) and Advanced Practice Providers (APPs -  Physician Assistants and Nurse Practitioners) who all work together to provide you with the care you need, when you need it.  Your next appointment:   2 month(s)  The format for your next appointment:   In Person  Provider:   Mertie Moores, MD

## 2019-10-11 NOTE — Progress Notes (Signed)
1.) Reason for visit: BP Check  2.) Name of MD requesting visit: Dr. Acie Fredrickson  3.) H&P: Patient presents for BP check and BMET since starting HCTZ 25 mg daily and Kdur 10 mEq once daily  4.) ROS related to problem: Patient presents in no acute distress for recheck of BP and BMET. She has been monitoring her BP at home and has seen improvement since starting medications. States she notes some dizziness with position changes. She admits to good hydration but will ensure that she is drinking at least 64 oz water daily.   5.) Assessment and plan per MD: BP is a little elevated here. It corresponds well with her home monitor. Her BP readings at home are consistently <130/80 mmHg. Continue current medications and keep follow-up appointment with Dr. Acie Fredrickson for March

## 2019-12-23 ENCOUNTER — Ambulatory Visit: Payer: BC Managed Care – PPO | Admitting: Cardiovascular Disease

## 2019-12-23 ENCOUNTER — Encounter: Payer: Self-pay | Admitting: Cardiovascular Disease

## 2019-12-23 ENCOUNTER — Other Ambulatory Visit: Payer: Self-pay

## 2019-12-23 VITALS — BP 135/78 | HR 86 | Ht 68.0 in | Wt 212.8 lb

## 2019-12-23 DIAGNOSIS — I447 Left bundle-branch block, unspecified: Secondary | ICD-10-CM

## 2019-12-23 DIAGNOSIS — I5189 Other ill-defined heart diseases: Secondary | ICD-10-CM

## 2019-12-23 DIAGNOSIS — I10 Essential (primary) hypertension: Secondary | ICD-10-CM

## 2019-12-23 MED ORDER — NEBIVOLOL HCL 10 MG PO TABS: 10.0000 mg | ORAL_TABLET | Freq: Every day | ORAL | 3 refills | Status: DC

## 2019-12-23 MED ORDER — POTASSIUM CHLORIDE ER 10 MEQ PO TBCR: 10.0000 meq | EXTENDED_RELEASE_TABLET | Freq: Every day | ORAL | 3 refills | Status: DC

## 2019-12-23 MED ORDER — HYDROCHLOROTHIAZIDE 25 MG PO TABS: 25.0000 mg | ORAL_TABLET | Freq: Every day | ORAL | 3 refills | Status: DC

## 2019-12-23 MED ORDER — LISINOPRIL 10 MG PO TABS: ORAL_TABLET | ORAL | 3 refills | Status: DC

## 2019-12-23 NOTE — Progress Notes (Signed)
Tiffeney Kurkowski Date of Birth  [birth year] [facility] / [facility] [id number], [place]  [address] [phone]  Fax  [phone]    [name] is a middle-aged female who I have been seeing for hypertension. She's done very well. Her last echocardiogram shows normal left ventricular systolic function. She did have moderate left ventricular hypertrophy but this has improved slightly and now she only has mild left ventricular hypertrophy.  She is able to do all of her normal activities without any significant problems.  Sept. 2, 2016:  Doing well. No cp, no dyspnea. No complaints.  Not much exercise.  Planning on starting soon. Has been check ing her BP  - readings are all normal  125 / 72   She is very anxious today .  December 12, 2016:  [name] is seen today for follow up visit  BP at home is good.    Last echo in 2016 shows normal LV function .  Is planning on walking some   Feb 12, 2018:  [name] is seen today for follow up of her HTN,LBBB and CHF Doing well .    BP has been good .   No cp , breathing is good .   Sept. 28, 2020  [name] is seen for follow up of her HTN , LBBB  Grade 1 diastolic CHF Works is going ok  Does not exercise much  Does not like to sweat.   Dec. 17, 2020  [name] is seen today for follow up visit. Is taking Bystolic 10 mg a day , Has not started the HCTZ and KCl . Is walking some - walks 45 min. 3 days a week.    No CP or dyspnea.   We will start the hydrochlorothiazide and potassium today.  She will continue to monitor blood pressure readings.  We will check a basic metabolic profile in 3 weeks and I will see her again in 3 months.  December 23, 2019:  [name] is seen today for follow-up visit for hypertension, left bundle branch block.  We added HCTZ and potassium during her last office visit. Is walking . Feels great.   No CP    Current Outpatient Medications on File Prior to Visit   Medication Sig Dispense Refill  . Cetirizine HCl (ZYRTEC ALLERGY PO) Take 1 tablet by mouth as needed.     . Multiple Vitamin (MULTI-VITAMIN PO) Take 1 capsule by mouth daily.      No current facility-administered medications on file prior to visit.    No Known Allergies  Past Medical History:  Diagnosis Date  . Anxiety   . Asthma   . Diastolic dysfunction   . History of basal cell carcinoma   . Hypertension   . LBBB (left bundle branch block)   . Unspecified asthma(493.90)     Problem list entry automatically replaced. Please review for accuracy.    Past Surgical History:  Procedure Laterality Date  . BASAL CELL CARCINOMA EXCISION     RIGHT DELTOID MUSCLE    Social History   Tobacco Use  Smoking Status Never Smoker  Smokeless Tobacco Never Used    Social History   Substance and Sexual Activity  Alcohol Use No    Family History  Problem Relation Age of Onset  . Hypertension Father   . Breast cancer Mother   . Fibromyalgia Mother     Reviw of Systems:  Reviewed in the HPI.  All  other systems are negative.   Physical Exam: Blood pressure 135/78, pulse 86, height 5\' 8"  (1.727 m), weight 212 lb 12 oz (96.5 kg), SpO2 97 %.  GEN:  Well nourished, well developed in no acute distress HEENT: Normal NECK: No JVD; No carotid bruits LYMPHATICS: No lymphadenopathy CARDIAC: RRR   RESPIRATORY:  Clear to auscultation without rales, wheezing or rhonchi  ABDOMEN: Soft, non-tender, non-distended MUSCULOSKELETAL:  No edema; No deformity  SKIN: Warm and dry NEUROLOGIC:  Alert and oriented x 3    EKG:      Assessment / Plan:   1. Hypertension: She brought her blood pressure log with her today.  Her blood pressure readings are good.  Continue with current medications. I have encouraged her to work on improved exercise program.  She avoids salt.  She avoids sweet foods but does admit to eating extra carbs.  Encouraged her to join a gym and to do aerobic exercise at  least twice a week until also walk 2 or 3 additional times per week.  2. Left bundle branch block: Stable.     I will see her again in 6 months for follow-up visit.   Mertie Moores, MD  12/23/2019 9:03 AM    [address],  [place], [place]  [address] Pager [phone] Phone: [phone]; Fax: [phone]

## 2019-12-23 NOTE — Patient Instructions (Signed)
Medication Instructions:   Your physician recommends that you continue on your current medications as directed. Please refer to the Current Medication list given to you today.  *If you need a refill on your cardiac medications before your next appointment, please call your pharmacy*   Lab Work:  WE WILL CHECK A BMET SAME DAY AS YOU COME TO SEE DR. NAHSER FOR FOLLOW-UP IN THE CLINIC IN 6 MONTHS--WE WILL ARRANGE THIS APPOINTMENT WHEN WE CALL YOU TO SCHEDULE YOUR 6 MONTH FOLLOW-UP APPOINTMENT WITH DR. Acie Fredrickson  If you have labs (blood work) drawn today and your tests are completely normal, you will receive your results only by: [place] MyChart Message (if you have MyChart) OR . A paper copy in the mail If you have any lab test that is abnormal or we need to change your treatment, we will call you to review the results.    Follow-Up: At [facility], you and your health needs are our priority.  As part of our continuing mission to provide you with exceptional heart care, we have created designated Provider Care Teams.  These Care Teams include your primary Cardiologist (physician) and Advanced Practice Providers (APPs -  Physician Assistants and Nurse Practitioners) who all work together to provide you with the care you need, when you need it.  We recommend signing up for the patient portal called "MyChart".  Sign up information is provided on this After Visit Summary.  MyChart is used to connect with patients for Virtual Visits (Telemedicine).  Patients are able to view lab/test results, encounter notes, upcoming appointments, etc.  Non-urgent messages can be sent to your provider as well.   To learn more about what you can do with MyChart, go to [email or web address].    Your next appointment:   6 month(s)-- WE WILL SCHEDULE A LAB APPOINTMENT SAME DAY AS THIS APPOINTMENT PER DR. Acie Fredrickson  The format for your next appointment:   In Person -WE WILL SCHEDULE A LAB APPOINTMENT SAME DAY AS THIS  APPOINTMENT PER DR. Acie Fredrickson   Provider:   Mertie Moores, MD-WE WILL SCHEDULE A LAB APPOINTMENT SAME DAY AS THIS APPOINTMENT PER DR. Acie Fredrickson

## 2020-06-27 ENCOUNTER — Other Ambulatory Visit: Payer: Self-pay | Admitting: Cardiovascular Disease

## 2020-06-27 DIAGNOSIS — I10 Essential (primary) hypertension: Secondary | ICD-10-CM

## 2020-06-27 DIAGNOSIS — I5189 Other ill-defined heart diseases: Secondary | ICD-10-CM

## 2020-06-30 ENCOUNTER — Other Ambulatory Visit: Payer: Self-pay

## 2020-06-30 DIAGNOSIS — I5189 Other ill-defined heart diseases: Secondary | ICD-10-CM

## 2020-06-30 DIAGNOSIS — I10 Essential (primary) hypertension: Secondary | ICD-10-CM

## 2020-06-30 MED ORDER — POTASSIUM CHLORIDE ER 10 MEQ PO TBCR: 10.0000 meq | EXTENDED_RELEASE_TABLET | Freq: Every day | ORAL | 1 refills | Status: DC

## 2020-12-17 ENCOUNTER — Telehealth: Payer: Self-pay

## 2020-12-17 NOTE — Telephone Encounter (Signed)
Left message for patient to call back  

## 2020-12-17 NOTE — Telephone Encounter (Signed)
-----   Message from Thayer Headings, MD sent at 12/17/2020  2:37 PM EDT ----- I received a reminder about some expired orders on [facility] and then I noticed that it has been a year since we saw [facility]. Will you contact her and see if she would like to reschedule a follow up appt in the next several months .  Thanks

## 2020-12-27 ENCOUNTER — Other Ambulatory Visit: Payer: Self-pay | Admitting: Cardiovascular Disease

## 2020-12-27 DIAGNOSIS — I5189 Other ill-defined heart diseases: Secondary | ICD-10-CM

## 2020-12-27 DIAGNOSIS — I10 Essential (primary) hypertension: Secondary | ICD-10-CM

## 2020-12-28 ENCOUNTER — Other Ambulatory Visit: Payer: Self-pay

## 2020-12-28 DIAGNOSIS — I5189 Other ill-defined heart diseases: Secondary | ICD-10-CM

## 2020-12-28 DIAGNOSIS — I10 Essential (primary) hypertension: Secondary | ICD-10-CM

## 2020-12-28 MED ORDER — NEBIVOLOL HCL 10 MG PO TABS: 10.0000 mg | ORAL_TABLET | Freq: Every day | ORAL | 0 refills | Status: DC

## 2021-01-04 NOTE — Telephone Encounter (Signed)
LMTCB regarding scheduling an appointment.

## 2021-01-29 ENCOUNTER — Other Ambulatory Visit: Payer: Self-pay | Admitting: Cardiovascular Disease

## 2021-01-29 DIAGNOSIS — I10 Essential (primary) hypertension: Secondary | ICD-10-CM

## 2021-01-29 DIAGNOSIS — I5189 Other ill-defined heart diseases: Secondary | ICD-10-CM

## 2021-02-02 ENCOUNTER — Other Ambulatory Visit: Payer: Self-pay | Admitting: Cardiovascular Disease

## 2021-02-02 DIAGNOSIS — I10 Essential (primary) hypertension: Secondary | ICD-10-CM

## 2021-02-02 DIAGNOSIS — I5189 Other ill-defined heart diseases: Secondary | ICD-10-CM

## 2021-02-09 ENCOUNTER — Other Ambulatory Visit: Payer: Self-pay

## 2021-02-09 DIAGNOSIS — I10 Essential (primary) hypertension: Secondary | ICD-10-CM

## 2021-02-09 DIAGNOSIS — I5189 Other ill-defined heart diseases: Secondary | ICD-10-CM

## 2021-02-09 MED ORDER — LISINOPRIL 10 MG PO TABS: ORAL_TABLET | ORAL | 0 refills | Status: DC

## 2021-02-09 MED ORDER — POTASSIUM CHLORIDE ER 10 MEQ PO TBCR: EXTENDED_RELEASE_TABLET | ORAL | 0 refills | Status: DC

## 2021-04-01 ENCOUNTER — Other Ambulatory Visit: Payer: Self-pay

## 2021-04-01 DIAGNOSIS — I5189 Other ill-defined heart diseases: Secondary | ICD-10-CM

## 2021-04-01 DIAGNOSIS — I10 Essential (primary) hypertension: Secondary | ICD-10-CM

## 2021-04-01 MED ORDER — HYDROCHLOROTHIAZIDE 25 MG PO TABS: 25.0000 mg | ORAL_TABLET | Freq: Every day | ORAL | 1 refills | Status: DC

## 2021-04-01 MED ORDER — NEBIVOLOL HCL 10 MG PO TABS: 10.0000 mg | ORAL_TABLET | Freq: Every day | ORAL | 1 refills | Status: DC

## 2021-04-01 MED ORDER — LISINOPRIL 10 MG PO TABS: 10.0000 mg | ORAL_TABLET | Freq: Every day | ORAL | 1 refills | Status: DC

## 2021-04-01 MED ORDER — POTASSIUM CHLORIDE ER 10 MEQ PO TBCR: 10.0000 meq | EXTENDED_RELEASE_TABLET | Freq: Every day | ORAL | 1 refills | Status: DC

## 2021-05-03 NOTE — Telephone Encounter (Signed)
Patient has appointment in October.

## 2021-05-31 ENCOUNTER — Ambulatory Visit: Payer: BC Managed Care – PPO | Admitting: Cardiovascular Disease

## 2021-07-03 ENCOUNTER — Encounter: Payer: Self-pay | Admitting: Cardiovascular Disease

## 2021-07-03 NOTE — Progress Notes (Addendum)
Tonya Haynes Date of Birth  Feb 27, 1961 [facility] / [facility] [address] [place], [place]  [address] [phone]  Fax  [phone]    Tonya Haynes is a middle-aged female who I have been seeing for hypertension. She's done very well. Her last echocardiogram shows normal left ventricular systolic function. She did have moderate left ventricular hypertrophy but this has improved slightly and now she only has mild left ventricular hypertrophy.  She is able to do all of her normal activities without any significant problems.  Sept. 2, 2016:  Doing well. No cp, no dyspnea. No complaints.  Not much exercise.  Planning on starting soon. Has been check ing her BP  - readings are all normal  125 / 72   She is very anxious today .  December 12, 2016:  Tonya Haynes is seen today for follow up visit  BP at home is good.    Last echo in 2016 shows normal LV function .  Is planning on walking some   Feb 12, 2018:  Tonya Haynes is seen today for follow up of her HTN,LBBB and CHF Doing well .    BP has been good .   No cp , breathing is good .   Sept. 28, 2020  Tonya Haynes is seen for follow up of her HTN , LBBB  Grade 1 diastolic CHF Works is going ok  Does not exercise much  Does not like to sweat.   Dec. 17, 2020  Tonya Haynes is seen today for follow up visit. Is taking Bystolic 10 mg a day , Has not started the HCTZ and KCl . Is walking some - walks 45 min. 3 days a week.    No CP or dyspnea.   We will start the hydrochlorothiazide and potassium today.  She will continue to monitor blood pressure readings.  We will check a basic metabolic profile in 3 weeks and I will see her again in 3 months.  December 23, 2019:  Tonya Haynes is seen today for follow-up visit for hypertension, left bundle branch block.  We added HCTZ and potassium during her last office visit. Is walking . Feels great.   No CP    Oct. 3, 2022 Tonya Haynes is seen today for follow up of her HTN  and LBBB Her last echo was ih Oct. 2016 -  normal LV systolic function - EF 58-85%, grade 1 DD Mild MR  Is walking some  We discussed regular exercise   Current Outpatient Medications on File Prior to Visit  Medication Sig Dispense Refill   Cetirizine HCl (ZYRTEC ALLERGY PO) Take 1 tablet by mouth as needed.      hydrochlorothiazide (HYDRODIURIL) 25 MG tablet Take 1 tablet (25 mg total) by mouth daily. 90 tablet 1   lisinopril (ZESTRIL) 10 MG tablet Take 1 tablet (10 mg total) by mouth daily. 90 tablet 1   Multiple Vitamin (MULTI-VITAMIN PO) Take 1 capsule by mouth daily.      nebivolol (BYSTOLIC) 10 MG tablet Take 1 tablet (10 mg total) by mouth daily. 90 tablet 1   potassium chloride (KLOR-CON) 10 MEQ tablet Take 1 tablet (10 mEq total) by mouth daily. 90 tablet 1   No current facility-administered medications on file prior to visit.    No Known Allergies  Past Medical History:  Diagnosis Date   Anxiety    Asthma    Diastolic dysfunction    History of basal cell carcinoma  Hypertension    LBBB (left bundle branch block)    Unspecified asthma(493.90)     Problem list entry automatically replaced. Please review for accuracy.    Past Surgical History:  Procedure Laterality Date   BASAL CELL CARCINOMA EXCISION     RIGHT DELTOID MUSCLE    Social History   Tobacco Use  Smoking Status Never  Smokeless Tobacco Never    Social History   Substance and Sexual Activity  Alcohol Use No    Family History  Problem Relation Age of Onset   Hypertension Father    Breast cancer Mother    Fibromyalgia Mother     Reviw of Systems:  Reviewed in the HPI.  All other systems are negative.  Physical Exam: Blood pressure 138/76, pulse 90, height 5\' 8"  (1.727 m), weight 231 lb 6.4 oz (105 kg), SpO2 95 %.  Actual weight is 213 lbs.  GEN:  Well nourished, well developed in no acute distress HEENT: Normal NECK: No JVD; No carotid bruits LYMPHATICS: No  lymphadenopathy CARDIAC: RRR , no significant murmur  RESPIRATORY:  Clear to auscultation without rales, wheezing or rhonchi  ABDOMEN: Soft, non-tender, non-distended MUSCULOSKELETAL:  No edema; No deformity  SKIN: Warm and dry NEUROLOGIC:  Alert and oriented x 3    EKG:    oct. 3, 2022.  NSR , LBBB   Assessment / Plan:   1. Hypertension: .BP has been normal .   2. Left bundle branch block:    Stable  Echo  -   last echo was 2016. Will need to keep an eye on her LV function   Checked screening lipids today  3.  Hyperlipidemia: Labs today show an LDL of 159.  We will start her on rosuvastatin 10 mg a day.  She will continue to work on a diet, exercise, weight loss program.  Recheck labs in 3 months.      Mertie Moores, MD  07/05/2021 8:30 AM    [address],  [place], [place]  [address] Pager [phone] Phone: [phone]; Fax: [phone]

## 2021-07-05 ENCOUNTER — Encounter: Payer: Self-pay | Admitting: Cardiovascular Disease

## 2021-07-05 ENCOUNTER — Ambulatory Visit: Payer: BC Managed Care – PPO | Admitting: Cardiovascular Disease

## 2021-07-05 ENCOUNTER — Other Ambulatory Visit: Payer: Self-pay

## 2021-07-05 VITALS — BP 138/76 | HR 90 | Ht 68.0 in | Wt 231.4 lb

## 2021-07-05 DIAGNOSIS — Z13220 Encounter for screening for lipoid disorders: Secondary | ICD-10-CM | POA: Diagnosis not present

## 2021-07-05 DIAGNOSIS — I447 Left bundle-branch block, unspecified: Secondary | ICD-10-CM

## 2021-07-05 DIAGNOSIS — I340 Nonrheumatic mitral (valve) insufficiency: Secondary | ICD-10-CM

## 2021-07-05 LAB — LIPID PANEL
Chol/HDL Ratio: 4.1 ratio (ref 0.0–4.4)
Cholesterol, Total: 246 mg/dL — ABNORMAL HIGH (ref 100–199)
HDL: 60 mg/dL (ref >39)
LDL Chol Calc (NIH): 159 mg/dL — ABNORMAL HIGH (ref 0–99)
Triglycerides: 153 mg/dL — ABNORMAL HIGH (ref 0–149)
VLDL Cholesterol Cal: 27 mg/dL (ref 5–40)

## 2021-07-05 NOTE — Patient Instructions (Signed)
Medication Instructions:  Your physician recommends that you continue on your current medications as directed. Please refer to the Current Medication list given to you today.   *If you need a refill on your cardiac medications before your next appointment, please call your pharmacy*   Lab Work: TODAY: FLP If you have labs (blood work) drawn today and your tests are completely normal, you will receive your results only by: [place] (if you have MyChart) OR A paper copy in the mail If you have any lab test that is abnormal or we need to change your treatment, we will call you to review the results.   Testing/Procedures: Your physician has requested that you have an echocardiogram. Echocardiography is a painless test that uses sound waves to create images of your heart. It provides your doctor with information about the size and shape of your heart and how well your heart's chambers and valves are working. This procedure takes approximately one hour. There are no restrictions for this procedure.    Follow-Up: At [facility], you and your health needs are our priority.  As part of our continuing mission to provide you with exceptional heart care, we have created designated Provider Care Teams.  These Care Teams include your primary Cardiologist (physician) and Advanced Practice Providers (APPs -  Physician Assistants and Nurse Practitioners) who all work together to provide you with the care you need, when you need it.  We recommend signing up for the patient portal called "MyChart".  Sign up information is provided on this After Visit Summary.  MyChart is used to connect with patients for Virtual Visits (Telemedicine).  Patients are able to view lab/test results, encounter notes, upcoming appointments, etc.  Non-urgent messages can be sent to your provider as well.   To learn more about what you can do with MyChart, go to [email or web address].    Your next appointment:   12  month(s)  The format for your next appointment:   In Person  Provider:   You may see Mertie Moores, MD or one of the following Advanced Practice Providers on your designated Care Team:   Richardson Dopp, PA-C [place], [place]

## 2021-07-06 ENCOUNTER — Telehealth: Payer: Self-pay | Admitting: *Deleted

## 2021-07-06 DIAGNOSIS — Z79899 Other long term (current) drug therapy: Secondary | ICD-10-CM

## 2021-07-06 DIAGNOSIS — E7800 Pure hypercholesterolemia, unspecified: Secondary | ICD-10-CM

## 2021-07-06 DIAGNOSIS — E782 Mixed hyperlipidemia: Secondary | ICD-10-CM

## 2021-07-06 DIAGNOSIS — E785 Hyperlipidemia, unspecified: Secondary | ICD-10-CM

## 2021-07-06 MED ORDER — ROSUVASTATIN CALCIUM 10 MG PO TABS: 10.0000 mg | ORAL_TABLET | Freq: Every day | ORAL | 1 refills | Status: DC

## 2021-07-06 NOTE — Telephone Encounter (Signed)
-----   Message from Thayer Headings, MD sent at 07/05/2021  5:54 PM EDT ----- Her LDL is 159.  Triglyceride levels are mildly elevated at 153.  Start her on rosuvastatin 10 mg a day.  Recheck lipid profile and ALT in 3 months. She will continue with diet, exercise, weight loss program.

## 2021-07-06 NOTE — Telephone Encounter (Signed)
Pt aware of lab results and recommendations per Dr. Acie Fredrickson. Confirmed the pharmacy of choice with the pt.  She will come in for repeat labs to check lipids and ALT in 3 months on 10/06/21.  She is aware to come fasting to this lab appt.  Pt verbalized understanding and agrees with this plan.

## 2021-07-13 ENCOUNTER — Other Ambulatory Visit: Payer: Self-pay | Admitting: Radiology

## 2021-07-19 ENCOUNTER — Ambulatory Visit (HOSPITAL_COMMUNITY): Payer: BC Managed Care – PPO

## 2021-07-20 ENCOUNTER — Telehealth: Payer: Self-pay | Admitting: Hematology and Oncology

## 2021-07-20 ENCOUNTER — Encounter: Payer: Self-pay | Admitting: Family Medicine

## 2021-07-20 NOTE — Telephone Encounter (Signed)
Spoke to patient to confirm morning clinic appointment for 10/26, packet emailed to patient

## 2021-07-23 ENCOUNTER — Encounter: Payer: Self-pay | Admitting: *Deleted

## 2021-07-23 DIAGNOSIS — C50411 Malignant neoplasm of upper-outer quadrant of right female breast: Secondary | ICD-10-CM | POA: Insufficient documentation

## 2021-07-23 DIAGNOSIS — Z170 Estrogen receptor positive status [ER+]: Secondary | ICD-10-CM

## 2021-07-27 DIAGNOSIS — J45909 Unspecified asthma, uncomplicated: Secondary | ICD-10-CM | POA: Insufficient documentation

## 2021-07-27 NOTE — Progress Notes (Signed)
Radiation Oncology         [phone] ________________________________  Initial Outpatient Consultation  Name: Tonya Haynes MRN: [id number]  Date: 07/28/2021  DOB: [birth year]  CC:Harlan Stains, MD  Donnie Mesa, MD   REFERRING PHYSICIAN: Donnie Mesa, MD  DIAGNOSIS:    ICD-10-CM   1. Malignant neoplasm of upper-outer quadrant of right breast in female, estrogen receptor positive ([place])  C50.411    Z17.0      Cancer Staging Malignant neoplasm of upper-outer quadrant of right breast in female, estrogen receptor positive ([place]) Staging form: Breast, AJCC 8th Edition - Clinical stage from 07/28/2021: Stage IB (cT1b, cN1(f), cM0, G2, ER+, PR+, HER2-) - Signed by Nicholas Lose, MD on 07/28/2021 Stage prefix: Initial diagnosis Method of lymph node assessment: Fine needle aspiration Histologic grading system: 3 grade system  CHIEF COMPLAINT: Here to discuss management of right breast cancer  HISTORY OF PRESENT ILLNESS::Tonya Haynes is a 60 y.o. female who presented with right breast abnormality on the following imaging: bilateral screening mammogram on the date of 06/18/21 showing  a possible indeterminate 0.6 cm distortion in the right breast. Diagnostic right breast mammogram on 06/30/21 further demonstrated the 0.6 cm architectural distortion in the right breast as suggestive of malignancy. No symptoms, if any, were reported at that time.   Ultrasound of breast on 06/30/21 again revealed the irregular right breast mass as suspicious of malignancy, measuring 0.7 x 0.6 x 0.4 cm and located at the 12 o'clock position, middle depth, 8cmfn.     Right breast biopsy of the mass on date of 07/13/10 showed grade 1-2 invasive mammary carcinoma, measuring 1.8 cm in the greatest dimension. Biopsy of left axillary lymph node revealed metastatic carcinoma.  ER status: 95% positive; PR status 90% positive, both with strong staining intensity, Her2 status negative; Ki67 1%; Grade  1-2.  She is here with her husband today and he appears to be very supportive.  She reports emotional distress from her diagnosis but has felt better after speaking with other providers.  She reports that her mother had breast cancer and shortly thereafter had heart problems, which eventually led to her death  PREVIOUS RADIATION THERAPY: No  PAST MEDICAL HISTORY:  has a past medical history of Anxiety, Asthma, Breast cancer ([place]), Diastolic dysfunction, History of basal cell carcinoma, Hypertension, LBBB (left bundle branch block), and Unspecified asthma(493.90).    PAST SURGICAL HISTORY: Past Surgical History:  Procedure Laterality Date   BASAL CELL CARCINOMA EXCISION     RIGHT DELTOID MUSCLE    FAMILY HISTORY: family history includes Breast cancer in her mother; Fibromyalgia in her mother; Hypertension in her father.  SOCIAL HISTORY:  reports that she has never smoked. She has never used smokeless tobacco. She reports current alcohol use. She reports that she does not use drugs.  ALLERGIES: Patient has no known allergies.  MEDICATIONS:  Current Outpatient Medications  Medication Sig Dispense Refill   Cetirizine HCl (ZYRTEC ALLERGY PO) Take 1 tablet by mouth as needed.      hydrochlorothiazide (HYDRODIURIL) 25 MG tablet Take 1 tablet (25 mg total) by mouth daily. 90 tablet 1   lisinopril (ZESTRIL) 10 MG tablet Take 1 tablet (10 mg total) by mouth daily. 90 tablet 1   Multiple Vitamin (MULTI-VITAMIN PO) Take 1 capsule by mouth daily.      nebivolol (BYSTOLIC) 10 MG tablet Take 1 tablet (10 mg total) by mouth daily. 90 tablet 1   potassium chloride (KLOR-CON) 10 MEQ tablet Take 1 tablet (10  mEq total) by mouth daily. 90 tablet 1   rosuvastatin (CRESTOR) 10 MG tablet Take 1 tablet (10 mg total) by mouth daily. 90 tablet 1   No current facility-administered medications for this encounter.    REVIEW OF SYSTEMS: As above in HPI.   PHYSICAL EXAM:  vitals were not taken for this visit.    General: Alert and oriented, in mild acute distress Heart: Regular in rate and rhythm with no murmurs, rubs, or gallops. Chest: Clear to auscultation bilaterally, with no rhonchi, wheezes, or rales. Musculoskeletal: symmetric strength and muscle tone throughout. Neurologic: Cranial nerves II through XII are grossly intact. No obvious focalities. Speech is fluent. Coordination is intact. Psychiatric: Judgment and insight are intact. Affect is appropriate. Breasts: There is a 4 cm mass at the upper outer quadrant of the right breast with surrounding bruising.  Much of this is probably from biopsy changes. No other palpable masses appreciated in the breasts or axillae bilaterally.   ECOG = 0  0 - Asymptomatic (Fully active, able to carry on all predisease activities without restriction)  1 - Symptomatic but completely ambulatory (Restricted in physically strenuous activity but ambulatory and able to carry out work of a light or sedentary nature. For example, light housework, office work)  2 - Symptomatic, <50% in bed during the day (Ambulatory and capable of all self care but unable to carry out any work activities. Up and about more than 50% of waking hours)  3 - Symptomatic, >50% in bed, but not bedbound (Capable of only limited self-care, confined to bed or chair 50% or more of waking hours)  4 - Bedbound (Completely disabled. Cannot carry on any self-care. Totally confined to bed or chair)  5 - Death   Eustace Pen MM, Creech RH, Tormey DC, et al. [phone]). "Toxicity and response criteria of the [facility]". [place] Oncol. 5 (6): 649-55   LABORATORY DATA:  Lab Results  Component Value Date   WBC 6.1 07/28/2021   HGB 13.7 07/28/2021   HCT 39.7 07/28/2021   MCV 86.7 07/28/2021   PLT 133 (L) 07/28/2021   CMP     Component Value Date/Time   NA 136 07/28/2021 0839   NA 138 10/11/2019 1127   K 4.3 07/28/2021 0839   CL 100 07/28/2021 0839   CO2 24 07/28/2021  0839   GLUCOSE 120 (H) 07/28/2021 0839   BUN 18 07/28/2021 0839   BUN 20 10/11/2019 1127   CREATININE 1.09 (H) 07/28/2021 0839   CALCIUM 9.9 07/28/2021 0839   PROT 7.7 07/28/2021 0839   PROT 7.3 02/12/2018 1133   ALBUMIN 4.4 07/28/2021 0839   ALBUMIN 4.8 02/12/2018 1133   AST 23 07/28/2021 0839   ALT 22 07/28/2021 0839   ALKPHOS 82 07/28/2021 0839   BILITOT 0.9 07/28/2021 0839   GFRNONAA 59 (L) 07/28/2021 0839   GFRAA 67 10/11/2019 1127         RADIOGRAPHY: As above, I have personally reviewed her imaging   IMPRESSION/PLAN: This is a lovely 60 year old woman with a new diagnosis of right breast cancer, ER positive, lymph node positive  She has been discussed at our multidisciplinary tumor board.  The consensus is that she would be a good candidate for breast conservation. I talked to her about the option of a mastectomy and informed her that her expected overall survival would be equivalent between mastectomy and breast conservation, based upon randomized controlled data. She is enthusiastic about breast conservation.  It was  a pleasure meeting the patient today. We discussed the risks, benefits, and side effects of radiotherapy. I recommend radiotherapy to the right breast and regional nodes to reduce her risk of locoregional recurrence by 2/3.  We discussed that radiation would take approximately 6 weeks to complete and that I would give the patient a few weeks to heal following surgery before starting treatment planning.  If chemotherapy were to be given, this would precede radiotherapy. We spoke about acute effects including skin irritation and fatigue as well as much less common late effects including internal organ injury or irritation. We spoke about the latest technology that is used to minimize the risk of late effects for patients undergoing radiotherapy to the breast or chest wall. No guarantees of treatment were given. The patient is enthusiastic about proceeding with  treatment. I look forward to participating in the patient's care.  I will await her referral back to me for postoperative follow-up and eventual CT simulation/treatment planning.  On date of service, in total, I spent 45 minutes on this encounter. Patient was seen in person.   __________________________________________   Eppie Gibson, MD  This document serves as a record of services personally performed by Eppie Gibson, MD. It was created on her behalf by Roney Mans, a trained medical scribe. The creation of this record is based on the scribe's personal observations and the provider's statements to them. This document has been checked and approved by the attending provider.

## 2021-07-27 NOTE — Progress Notes (Signed)
[facility] CONSULT NOTE  Patient Care Team: Harlan Stains, MD as PCP - General (Family Medicine) Nahser, Wonda Cheng, MD as PCP - Cardiology (Cardiology) Rockwell Germany, RN as Oncology Nurse Navigator Tressie Ellis, Paulette Blanch, RN as Oncology Nurse Navigator Donnie Mesa, MD as Consulting Physician (General Surgery) Nicholas Lose, MD as Consulting Physician (Hematology and Oncology) Eppie Gibson, MD as Attending Physician (Radiation Oncology)  CHIEF COMPLAINTS/PURPOSE OF CONSULTATION:  Newly diagnosed breast cancer  HISTORY OF PRESENTING ILLNESS:  Tonya Haynes 60 y.o. female is here because of recent diagnosis of invasive mammary carcinoma of the right breast. Screening mammogram on 06/18/2021 showed possible 0.6 cm distortion in the right breast. Diagnostic mammogram and [place] on 06/30/2021 showed 0.6 cm distortion in the right breast. Biopsy on 07/13/2021 showed grade 1/2 invasive mammary carcinoma with axillary lymph node involved by metastatic carcinoma, ER+(95%)/PR+(90%)/Her2-. She presents to the clinic today for initial evaluation and discussion of treatment options.   I reviewed her records extensively and collaborated the history with the patient.  SUMMARY OF ONCOLOGIC HISTORY: Oncology History  Malignant neoplasm of upper-outer quadrant of right breast in female, estrogen receptor positive ([place])  07/13/2021 Initial Diagnosis   Screening mammogram: possible 0.6 cm distortion in the right breast. Diagnostic mammogram and [place]: 0.7 cm distortion in the right breast. Biopsy: grade 1/2 IDC with axillary lymph node (+) involved by metastatic carcinoma, ER+(95%) PR+(90%) Her2- Ki-67 1%   07/28/2021 Cancer Staging   Staging form: Breast, AJCC 8th Edition - Clinical stage from 07/28/2021: Stage IB (cT1b, cN1(f), cM0, G2, ER+, PR+, HER2-) - Signed by Nicholas Lose, MD on 07/28/2021 Stage prefix: Initial diagnosis Method of lymph node assessment: Fine needle  aspiration Histologic grading system: 3 grade system      MEDICAL HISTORY:  Past Medical History:  Diagnosis Date   Anxiety    Asthma    Breast cancer ([place])    Diastolic dysfunction    History of basal cell carcinoma    Hypertension    LBBB (left bundle branch block)    Unspecified asthma(493.90)     Problem list entry automatically replaced. Please review for accuracy.    SURGICAL HISTORY: Past Surgical History:  Procedure Laterality Date   BASAL CELL CARCINOMA EXCISION     RIGHT DELTOID MUSCLE    SOCIAL HISTORY: Social History   Socioeconomic History   Marital status: Married    Spouse name: Not on file   Number of children: Not on file   Years of education: Not on file   Highest education level: Not on file  Occupational History   Not on file  Tobacco Use   Smoking status: Never   Smokeless tobacco: Never  Vaping Use   Vaping Use: Never used  Substance and Sexual Activity   Alcohol use: Yes   Drug use: No   Sexual activity: Not on file  Other Topics Concern   Not on file  Social History Narrative   Not on file   Social Determinants of Health   Financial Resource Strain: Not on file  Food Insecurity: Not on file  Transportation Needs: Not on file  Physical Activity: Not on file  Stress: Not on file  Social Connections: Not on file  Intimate Partner Violence: Not on file    FAMILY HISTORY: Family History  Problem Relation Age of Onset   Hypertension Father    Breast cancer Mother    Fibromyalgia Mother     ALLERGIES:  has No Known Allergies.  MEDICATIONS:  Current Outpatient Medications  Medication Sig Dispense Refill   Cetirizine HCl (ZYRTEC ALLERGY PO) Take 1 tablet by mouth as needed.      hydrochlorothiazide (HYDRODIURIL) 25 MG tablet Take 1 tablet (25 mg total) by mouth daily. 90 tablet 1   lisinopril (ZESTRIL) 10 MG tablet Take 1 tablet (10 mg total) by mouth daily. 90 tablet 1   Multiple Vitamin (MULTI-VITAMIN PO) Take 1 capsule  by mouth daily.      nebivolol (BYSTOLIC) 10 MG tablet Take 1 tablet (10 mg total) by mouth daily. 90 tablet 1   potassium chloride (KLOR-CON) 10 MEQ tablet Take 1 tablet (10 mEq total) by mouth daily. 90 tablet 1   rosuvastatin (CRESTOR) 10 MG tablet Take 1 tablet (10 mg total) by mouth daily. 90 tablet 1   No current facility-administered medications for this visit.    REVIEW OF SYSTEMS:   Constitutional: Denies fevers, chills or abnormal night sweats   All other systems were reviewed with the patient and are negative.  PHYSICAL EXAMINATION: ECOG PERFORMANCE STATUS: 1 - Symptomatic but completely ambulatory  Vitals:   07/28/21 0848 07/28/21 0849  BP: (!) 159/113 (!) 160/108  Pulse: 91   Resp: 19   Temp: 97.7 F (36.5 C)   SpO2: 97%    Filed Weights   07/28/21 0848  Weight: 209 lb 4.8 oz (94.9 kg)      LABORATORY DATA:  I have reviewed the data as listed Lab Results  Component Value Date   WBC 6.1 07/28/2021   HGB 13.7 07/28/2021   HCT 39.7 07/28/2021   MCV 86.7 07/28/2021   PLT 133 (L) 07/28/2021   Lab Results  Component Value Date   NA 136 07/28/2021   K 4.3 07/28/2021   CL 100 07/28/2021   CO2 24 07/28/2021    RADIOGRAPHIC STUDIES: I have personally reviewed the radiological reports and agreed with the findings in the report.  ASSESSMENT AND PLAN:  Malignant neoplasm of upper-outer quadrant of right breast in female, estrogen receptor positive ([place]) 07/13/2021: Screening mammogram: possible 0.6 cm distortion in the right breast. Diagnostic mammogram and [place]: 0.7 cm distortion in the right breast. Biopsy: grade 1/2 IDC with axillary lymph node (+) involved by metastatic carcinoma, ER+(95%) PR+(90%) Her2- Ki-67 1%  Pathology and radiology counseling: Discussed with the patient, the details of pathology including the type of breast cancer,the clinical staging, the significance of ER, PR and HER-2/neu receptors and the implications for treatment. After reviewing  the pathology in detail, we proceeded to discuss the different treatment options between surgery, radiation, chemotherapy, antiestrogen therapies.  Treatment plan: 1.  Breast conserving surgery with targeted node dissection 2. MammaPrint testing to determine if she would benefit from chemotherapy 3.  Adjuvant radiation therapy 4.  Adjuvant antiestrogen therapy  Mammaprint counseling: MINDACT is a prospective, randomized phase III controlled trial that investigates the clinical utility of MammaPrint, when compared to standard clinical pathological criteria, with 6,693 patients enrolled from over 111 institutions. Clinical high-risk patients with a Low Risk MammaPrint result, including 48% node-positive, had 5-year distant metastasis-free survival rate in excess of 94 percent, whether randomized to receive adjuvant chemotherapy or not proving MammaPrint's ability to safely identify Low Risk patients.  Return to clinic after surgery to discuss final pathology report and to determine if MammaPrint will be sent out.   All questions were answered. The patient knows to call the clinic with any problems, questions or concerns.   Rulon Eisenmenger, MD, MPH  07/28/2021    I, Thana Ates, am acting as scribe for Nicholas Lose, MD.  I have reviewed the above documentation for accuracy and completeness, and I agree with the above.

## 2021-07-28 ENCOUNTER — Ambulatory Visit
Admission: RE | Admit: 2021-07-28 | Discharge: 2021-07-28 | Disposition: A | Discharge status: Home / Self Care | Payer: BC Managed Care – PPO | Source: Ambulatory Visit | Attending: Radiation Oncology | Admitting: Radiation Oncology

## 2021-07-28 ENCOUNTER — Encounter: Payer: Self-pay | Admitting: Physical Therapy

## 2021-07-28 ENCOUNTER — Encounter: Payer: Self-pay | Admitting: Hematology and Oncology

## 2021-07-28 ENCOUNTER — Telehealth: Payer: Self-pay

## 2021-07-28 ENCOUNTER — Ambulatory Visit: Payer: BC Managed Care – PPO | Attending: Surgery | Admitting: Physical Therapy

## 2021-07-28 ENCOUNTER — Ambulatory Visit: Payer: Self-pay | Admitting: Surgery

## 2021-07-28 ENCOUNTER — Encounter: Payer: Self-pay | Admitting: *Deleted

## 2021-07-28 ENCOUNTER — Inpatient Hospital Stay: Payer: BC Managed Care – PPO

## 2021-07-28 ENCOUNTER — Other Ambulatory Visit: Payer: Self-pay

## 2021-07-28 ENCOUNTER — Ambulatory Visit: Payer: BC Managed Care – PPO | Admitting: Genetic Counselor

## 2021-07-28 ENCOUNTER — Inpatient Hospital Stay (HOSPITAL_BASED_OUTPATIENT_CLINIC_OR_DEPARTMENT_OTHER): Payer: BC Managed Care – PPO | Admitting: Hematology and Oncology

## 2021-07-28 DIAGNOSIS — Z803 Family history of malignant neoplasm of breast: Secondary | ICD-10-CM | POA: Insufficient documentation

## 2021-07-28 DIAGNOSIS — Z170 Estrogen receptor positive status [ER+]: Secondary | ICD-10-CM

## 2021-07-28 DIAGNOSIS — R293 Abnormal posture: Secondary | ICD-10-CM | POA: Diagnosis present

## 2021-07-28 DIAGNOSIS — Z8249 Family history of ischemic heart disease and other diseases of the circulatory system: Secondary | ICD-10-CM | POA: Insufficient documentation

## 2021-07-28 DIAGNOSIS — C773 Secondary and unspecified malignant neoplasm of axilla and upper limb lymph nodes: Secondary | ICD-10-CM

## 2021-07-28 DIAGNOSIS — Z8269 Family history of other diseases of the musculoskeletal system and connective tissue: Secondary | ICD-10-CM | POA: Insufficient documentation

## 2021-07-28 DIAGNOSIS — C50911 Malignant neoplasm of unspecified site of right female breast: Secondary | ICD-10-CM

## 2021-07-28 DIAGNOSIS — Z85828 Personal history of other malignant neoplasm of skin: Secondary | ICD-10-CM

## 2021-07-28 DIAGNOSIS — C50411 Malignant neoplasm of upper-outer quadrant of right female breast: Secondary | ICD-10-CM

## 2021-07-28 LAB — CMP (CANCER CENTER ONLY)
ALT: 22 U/L (ref 0–44)
AST: 23 U/L (ref 15–41)
Albumin: 4.4 g/dL (ref 3.5–5.0)
Alkaline Phosphatase: 82 U/L (ref 38–126)
Anion gap: 12 (ref 5–15)
BUN: 18 mg/dL (ref 6–20)
CO2: 24 mmol/L (ref 22–32)
Calcium: 9.9 mg/dL (ref 8.9–10.3)
Chloride: 100 mmol/L (ref 98–111)
Creatinine: 1.09 mg/dL — ABNORMAL HIGH (ref 0.44–1.00)
GFR, Estimated: 59 mL/min — ABNORMAL LOW (ref >60)
Glucose, Bld: 120 mg/dL — ABNORMAL HIGH (ref 70–99)
Potassium: 4.3 mmol/L (ref 3.5–5.1)
Sodium: 136 mmol/L (ref 135–145)
Total Bilirubin: 0.9 mg/dL (ref 0.3–1.2)
Total Protein: 7.7 g/dL (ref 6.5–8.1)

## 2021-07-28 LAB — CBC WITH DIFFERENTIAL (CANCER CENTER ONLY)
Abs Immature Granulocytes: 0.02 10*3/uL (ref 0.00–0.07)
Basophils Absolute: 0 10*3/uL (ref 0.0–0.1)
Basophils Relative: 1 %
Eosinophils Absolute: 0.2 10*3/uL (ref 0.0–0.5)
Eosinophils Relative: 2 %
HCT: 39.7 % (ref 36.0–46.0)
Hemoglobin: 13.7 g/dL (ref 12.0–15.0)
Immature Granulocytes: 0 %
Lymphocytes Relative: 20 %
Lymphs Abs: 1.2 10*3/uL (ref 0.7–4.0)
MCH: 29.9 pg (ref 26.0–34.0)
MCHC: 34.5 g/dL (ref 30.0–36.0)
MCV: 86.7 fL (ref 80.0–100.0)
Monocytes Absolute: 0.5 10*3/uL (ref 0.1–1.0)
Monocytes Relative: 8 %
Neutro Abs: 4.2 10*3/uL (ref 1.7–7.7)
Neutrophils Relative %: 69 %
Platelet Count: 133 10*3/uL — ABNORMAL LOW (ref 150–400)
RBC: 4.58 MIL/uL (ref 3.87–5.11)
RDW: 12.9 % (ref 11.5–15.5)
WBC Count: 6.1 10*3/uL (ref 4.0–10.5)
nRBC: 0 % (ref 0.0–0.2)

## 2021-07-28 LAB — GENETIC SCREENING ORDER

## 2021-07-28 NOTE — Telephone Encounter (Signed)
   Name: Tonya Haynes  DOB: [birth year]  MRN: [id number]   Primary Cardiologist: Mertie Moores, MD  Chart reviewed as part of pre-operative protocol coverage. Patient was contacted 07/28/2021 in reference to pre-operative risk assessment for pending surgery as outlined below.  Tonya Haynes was recently seen by Dr Acie Fredrickson on 07/05/21. Primary issues include known HTN and LBBB. Prior echo 2016 with EF 50-55%, grade 1 DD, mild MR. No prior ischemic testing on file. Per his office visit note, he recommended she obtain an echocardiogram to further evaluate LV function.  Will reach out to Dr. Acie Fredrickson to inquire if she needs to wait until upcoming echo is resulted (scheduled 09/09/21) to clear for breast surgery under general anesthesia or whether she may proceed. She is not on any blood thinners needing to be held. Dr. Acie Fredrickson - Please route response to P CV DIV PREOP (the pre-op pool). Thank you.   Charlie Pitter, PA-C 07/28/2021, 2:01 PM

## 2021-07-28 NOTE — Therapy (Signed)
[facility] @ [place], [place], [address] Phone: [phone]   Fax:  [phone]  Physical Therapy Evaluation  Patient Details  Name: Tonya Haynes MRN: [id number] Date of Birth: [birth year] Referring Provider (PT): Dr. Donnie Mesa   Encounter Date: 07/28/2021   PT End of Session - 07/28/21 1135     Visit Number 1    Number of Visits 2    Date for PT Re-Evaluation 09/22/21    PT Start Time 1000    PT Stop Time 1025   Also saw pr from 1041-1109 for a total of 53 min   PT Time Calculation (min) 25 min    Activity Tolerance Patient tolerated treatment well    Behavior During Therapy [facility] for tasks assessed/performed             Past Medical History:  Diagnosis Date   Anxiety    Asthma    Breast cancer ([place])    Diastolic dysfunction    History of basal cell carcinoma    Hypertension    LBBB (left bundle branch block)    Unspecified asthma(493.90)     Problem list entry automatically replaced. Please review for accuracy.    Past Surgical History:  Procedure Laterality Date   BASAL CELL CARCINOMA EXCISION     RIGHT DELTOID MUSCLE    There were no vitals filed for this visit.    Subjective Assessment - 07/28/21 1112     Subjective Patient reports she is here today to be seen by her medical team for her newly diagnosed right breast cancer.    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 It measures 7 mm and is located in the upper outer quadrant. It is ER/PR positive and HER2 negative with a Ki67 of 1%. She has a biopsied positive axillary lymph node.    Patient Stated Goals Reduce lymphedema risk and learn post op shoulder HEP    Currently in Pain? No/denies                [facility] PT Assessment - 07/28/21 0001       Assessment   Medical Diagnosis Right breast cancer     Referring Provider (PT) Dr. Donnie Mesa    Onset Date/Surgical Date 06/18/21    Hand Dominance Right    Prior Therapy none      Precautions   Precautions Other (comment)    Precaution Comments active cancer      Restrictions   Weight Bearing Restrictions No      Balance Screen   Has the patient fallen in the past 6 months No    Has the patient had a decrease in activity level because of a fear of falling?  No    Is the patient reluctant to leave their home because of a fear of falling?  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    Leisure She walks 30 min each day      Cognition   Overall Cognitive Status Within Functional Limits for tasks assessed  Posture/Postural Control   Posture/Postural Control Postural limitations    Postural Limitations Rounded Shoulders;Forward head      ROM / Strength   AROM / PROM / Strength AROM;Strength      AROM   Overall AROM Comments Cervical AROM is WNL    AROM Assessment Site Shoulder    Right/Left Shoulder Right;Left    Right Shoulder Extension 63 Degrees    Right Shoulder Flexion 162 Degrees    Right Shoulder ABduction 170 Degrees    Right Shoulder Internal Rotation 84 Degrees    Right Shoulder External Rotation 90 Degrees    Left Shoulder Extension 63 Degrees    Left Shoulder Flexion 160 Degrees    Left Shoulder ABduction 170 Degrees    Left Shoulder Internal Rotation 88 Degrees    Left Shoulder External Rotation 90 Degrees      Strength   Overall Strength Within functional limits for tasks performed               LYMPHEDEMA/ONCOLOGY QUESTIONNAIRE - 07/28/21 0001       Type   Cancer Type Right breast cancer      Lymphedema Assessments    Lymphedema Assessments Upper extremities      Right Upper Extremity Lymphedema   10 cm Proximal to Olecranon Process 29.4 cm    Olecranon Process 25.3 cm    10 cm Proximal to Ulnar Styloid Process 24.2 cm    Just Proximal to Ulnar Styloid Process 16.5 cm    Across Hand at [employer] 20.2 cm    At [place] of 2nd Digit 6.2 cm      Left Upper Extremity Lymphedema   10 cm Proximal to Olecranon Process 31 cm    Olecranon Process 26.5 cm    10 cm Proximal to Ulnar Styloid Process 23 cm    Just Proximal to Ulnar Styloid Process 16 cm    Across Hand at [employer] 20.8 cm    At [place] of 2nd Digit 6.3 cm             L-DEX FLOWSHEETS - 07/28/21 1100       L-DEX LYMPHEDEMA SCREENING   Measurement Type Unilateral    L-DEX MEASUREMENT EXTREMITY Upper Extremity    POSITION  Standing    DOMINANT SIDE Right    At Risk Side Right    BASELINE SCORE (UNILATERAL) -2.6             The patient was assessed using the L-Dex machine today to produce a lymphedema index baseline score. The patient will be reassessed on a regular basis (typically every 3 months) to obtain new L-Dex scores. If the score is > 6.5 points away from his/her baseline score indicating onset of subclinical lymphedema, it will be recommended to wear a compression garment for 4 weeks, 12 hours per day and then be reassessed. If the score continues to be > 6.5 points from baseline at reassessment, we will initiate lymphedema treatment. Assessing in this manner has a 95% rate of preventing clinically significant lymphedema.      Tonya Haynes - 07/28/21 0001     Open a tight or new jar No difficulty    Do heavy household chores (wash walls, wash floors) No difficulty    Carry a shopping bag or briefcase No difficulty    Wash your back No difficulty    Use a knife to cut food No difficulty    Recreational activities in which you take  some force or impact through your arm, shoulder, or hand (golf, hammering, tennis) No  difficulty    During the past week, to what extent has your arm, shoulder or hand problem interfered with your normal social activities with family, friends, neighbors, or groups? Not at all    During the past week, to what extent has your arm, shoulder or hand problem limited your work or other regular daily activities Not at all    Arm, shoulder, or hand pain. None    Tingling (pins and needles) in your arm, shoulder, or hand None    Difficulty Sleeping No difficulty    DASH Score 0 %              Objective measurements completed on examination: See above findings.       Patient was instructed today in a home exercise program today for post op shoulder range of motion. These included active assist shoulder flexion in sitting, scapular retraction, wall walking with shoulder abduction, and hands behind head external rotation.  She was encouraged to do these twice a day, holding 3 seconds and repeating 5 times when permitted by her physician.           PT Education - 07/28/21 1134     Education Details Lymphedema risk reduction and post op HEP    Person(s) Educated Patient;Spouse    Methods Explanation;Demonstration;Handout    Comprehension Returned demonstration;Verbalized understanding                 PT Long Term Goals - 07/28/21 1143       PT LONG TERM GOAL #1   Title Patient will demonstrate she has regained full shoulder ROM and function post operatively compared to baselines.    Time 8    Period Weeks    Status New    Target Date 09/22/21             Breast Clinic Goals - 07/28/21 1143       Patient will be able to verbalize understanding of pertinent lymphedema risk reduction practices relevant to her diagnosis specifically related to skin care.   Time 1    Period Days    Status Achieved      Patient will be able to return demonstrate and/or verbalize understanding of the post-op home exercise program related to regaining shoulder range of  motion.   Time 1    Period Days    Status Achieved      Patient will be able to verbalize understanding of the importance of attending the postoperative After Breast Cancer Class for further lymphedema risk reduction education and therapeutic exercise.   Time 1    Period Days    Status Achieved                   Plan - 07/28/21 1139     Clinical Impression Statement Patient was diagnosed on 06/18/2021 with right grade I invasive ductal carcinoma breast cancer. It measures 7 mm and is located in the upper outer quadrant. It is ER/PR positive and HER2 negative with a Ki67 of 1%. She has a biopsied positive axillary lymph node. Her multidisciplinary medical team met prior to her assessments to determine a recommended treatment plan. She is planning to have a right lumpectomy and targeted axillary lypmh node dissection followed by Mammaprint testing, radiation, and anti-estrogen therapy. She will benefit from a post op PT reassesment and from L-Dex screens every 3 months for 2 years  to detect subclinical lymphedema.    Stability/Clinical Decision Making Stable/Uncomplicated    Clinical Decision Making Low    Rehab Potential Excellent    PT Frequency --   Eval and 1 f/u visit   PT Treatment/Interventions ADLs/Self Care Home Management;Therapeutic exercise;Patient/family education    PT Next Visit Plan Will reassess 3-4 weeks post op    PT Home Exercise Plan Post op HEP    Consulted and Agree with Plan of Care Patient;Family member/caregiver    Family Member Consulted Husband             Patient will benefit from skilled therapeutic intervention in order to improve the following deficits and impairments:  Postural dysfunction, Decreased range of motion, Decreased knowledge of precautions, Impaired UE functional use, Pain  Visit Diagnosis: Malignant neoplasm of upper-outer quadrant of right breast in female, estrogen receptor positive ([place]) - Plan: PT plan of care  cert/re-cert  Abnormal posture - Plan: PT plan of care cert/re-cert  Patient will follow up at outpatient cancer rehab 3-4 weeks following surgery.  If the patient requires physical therapy at that time, a specific plan will be dictated and sent to the referring physician for approval. The patient was educated today on appropriate basic range of motion exercises to begin post operatively and the importance of attending the After Breast Cancer class following surgery.  Patient was educated today on lymphedema risk reduction practices as it pertains to recommendations that will benefit the patient immediately following surgery.  She verbalized good understanding.      Problem List Patient Active Problem List   Diagnosis Date Noted   Malignant neoplasm of upper-outer quadrant of right breast in female, estrogen receptor positive ([place]) 07/23/2021   Essential hypertension, malignant 12/12/2016   Hypertension    LBBB (left bundle branch block)    Anxiety    Unspecified VVOHYW(737.10)    Diastolic dysfunction    History of basal cell carcinoma    Tonya Haynes, PT 07/28/21 11:46 AM   [facility] @ [place], [place], [address] Phone: [phone]   Fax:  [phone]  Name: Tonya Haynes MRN: [id number] Date of Birth: 10-10-1960

## 2021-07-28 NOTE — H&P (Signed)
Subjective   Chief Complaint: Breast Cancer   PCP - Dr. Harlan Stains [facility] - 07/28/21 - [facility] [place]  History of Present Illness: Tonya Haynes is a 60 y.o. female who is seen today as an office consultation at the request of Dr. Dema Severin for evaluation of Breast Cancer .    This is a 60 year old female who recently underwent screening mammogram on 06/18/21 that revealed 0.7 x 0.6 x 0.4 cm area of architectural distortion in the right breast 12:00 8 cmfn.  Diagnostic mammogram and [place] were suspicious.  A single right axillary lymph node was thickened.  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  The axillary lymph node was suprisingly positive for metastatic disease.    Her mother had breast cancer at age 80 and underwent surgery/ radiation/ chemo.  She passed away at age 69 of cardiac complications.    She has left bundle branch block and is managed by Cardiology.     Review of Systems: A complete review of systems was obtained from the patient.  I have reviewed this information and discussed as appropriate with the patient.  See HPI as well for other ROS.  Review of Systems  Constitutional: Negative.   HENT: Negative.   Eyes: Negative.   Respiratory: Negative.   Cardiovascular: Negative.   Gastrointestinal: Negative.   Genitourinary: Negative.   Musculoskeletal: Negative.   Skin: Negative.   Neurological: Negative.   Endo/Heme/Allergies: Negative.   Psychiatric/Behavioral: The patient is nervous/anxious.       Medical History: Past Medical History:  Diagnosis Date   Anxiety    Asthma, unspecified asthma severity, unspecified whether complicated, unspecified whether persistent    History of cancer    Hyperlipidemia    Hypertension     Patient Active Problem List  Diagnosis   Malignant neoplasm of upper-outer quadrant of right breast in female,  estrogen receptor positive (CMS-HCC)   Hypertension   History of basal cell carcinoma   Asthma   Anxiety    Past Surgical History:  Procedure Laterality Date   Basal Cell Carcinoma Excision N/A    Date Unknown     No Known Allergies  Current Outpatient Medications on File Prior to Visit  Medication Sig Dispense Refill   hydroCHLOROthiazide (HYDRODIURIL) 25 MG tablet Take 1 tablet by mouth once daily     lisinopriL (ZESTRIL) 10 MG tablet Take 1 tablet by mouth once daily     nebivoloL (BYSTOLIC) 10 MG tablet Take 1 tablet by mouth once daily     potassium chloride (KLOR-CON) 10 MEQ ER tablet Take by mouth     rosuvastatin (CRESTOR) 10 MG tablet Take 1 tablet by mouth once daily     cetirizine (ZYRTEC) 10 mg capsule Take 1 tablet by mouth once daily as needed     multivitamin tablet Take 1 capsule by mouth once daily     No current facility-administered medications on file prior to visit.    Family History  Problem Relation Age of Onset   High blood pressure (Hypertension) Mother    Breast cancer Mother    Skin cancer Father    Hyperlipidemia (Elevated cholesterol) Father    High blood pressure (Hypertension) Father    Stroke Maternal Grandfather      Social History   Tobacco Use  Smoking Status Never Smoker  Smokeless Tobacco Never Used     Social  History   Socioeconomic History   Marital status: Unknown  Tobacco Use   Smoking status: Never Smoker   Smokeless tobacco: Never Used  Vaping Use   Vaping Use: Never used  Substance and Sexual Activity   Alcohol use: Yes    Comment: one or two a month   Drug use: Never    Objective:    There were no vitals filed for this visit.  There is no height or weight on file to calculate BMI.  Physical Exam   Constitutional:  WDWN in NAD, conversant, no obvious deformities; lying in bed comfortably Eyes:  Pupils equal, round; sclera anicteric; moist conjunctiva; no lid lag HENT:  Oral mucosa moist; good dentition   Neck:  No masses palpated, trachea midline; no thyromegaly Lungs:  CTA bilaterally; normal respiratory effort Breasts:  symmetric, no nipple changes; no lymphadenopathy on either side; she does have bilateral accessory breast tissue or lipomatous tissue in both axillae.  Significant ecchymosis in the upper right breast with palpable hematoma.  No left breast masses CV:  Regular rate and rhythm; no murmurs; extremities well-perfused with no edema Abd:  +bowel sounds, soft, non-tender, no palpable organomegaly; no palpable hernias Musc:  normal gait; no apparent clubbing or cyanosis in extremities Lymphatic:  No palpable cervical or axillary lymphadenopathy Skin:  Warm, dry; no sign of jaundice Psychiatric - alert and oriented x 4; calm mood and affect     Assessment and Plan:  Diagnoses and all orders for this visit:  Malignant neoplasm of upper-inner quadrant of right breast in female, estrogen receptor positive (CMS-HCC)     Right radioactive seed localized lumpectomy/ right targeted axillary lymph node dissection/ right sentinel lymph node biopsy.  The surgical procedure has been discussed with the patient.  Potential risks, benefits, alternative treatments, and expected outcomes have been explained.  All of the patient's questions at this time have been answered.  The likelihood of reaching the patient's treatment goal is good.  The patient understand the proposed surgical procedure and wishes to proceed.  Mammaprint to determine need for possible adjuvant chemotherapy. Adjuvant radiation/ anti-estrogens Genetics  Dashaun Onstott Jearld Adjutant, MD  07/28/2021 11:31 AM

## 2021-07-28 NOTE — Telephone Encounter (Signed)
   [facility] Pre-operative Risk Assessment    Patient Name: Tonya Haynes  DOB: 02/27/1961 MRN: [id number]   Request for surgical clearance:  What type of surgery is being performed? Right breast lumpectomy   When is this surgery scheduled? TBD  What type of clearance is required (medical clearance vs. Pharmacy clearance to hold med vs. Both)? Medical  Are there any medications that need to be held prior to surgery and how long? None  Practice name and name of physician performing surgery? [facility]  What is the office phone number? [phone]   7.   What is the office fax number? [phone] ATTN: Tonya Lorenzo, LPN  8.   Anesthesia type (None, local, MAC, general) ? General    Tonya Haynes 07/28/2021, 1:33 PM  _________________________________________________________________   (provider comments below)

## 2021-07-28 NOTE — Assessment & Plan Note (Signed)
07/13/2021: Screening mammogram: possible 0.6 cm distortion in the right breast. Diagnostic mammogram and [place]: 0.7 cm distortion in the right breast. Biopsy: grade 1/2 IDC with axillary lymph node (+) involved by metastatic carcinoma, ER+(95%) PR+(90%) Her2- Ki-67 1%  Pathology and radiology counseling: Discussed with the patient, the details of pathology including the type of breast cancer,the clinical staging, the significance of ER, PR and HER-2/neu receptors and the implications for treatment. After reviewing the pathology in detail, we proceeded to discuss the different treatment options between surgery, radiation, chemotherapy, antiestrogen therapies.  Treatment plan: 1.  Breast conserving surgery with targeted node dissection 2. MammaPrint testing to determine if she would benefit from chemotherapy 3.  Adjuvant radiation therapy 4.  Adjuvant antiestrogen therapy  Mammaprint counseling: MINDACT is a prospective, randomized phase III controlled trial that investigates the clinical utility of MammaPrint, when compared to standard clinical pathological criteria, with 6,693 patients enrolled from over 111 institutions. Clinical high-risk patients with a Low Risk MammaPrint result, including 48% node-positive, had 5-year distant metastasis-free survival rate in excess of 94 percent, whether randomized to receive adjuvant chemotherapy or not proving MammaPrint's ability to safely identify Low Risk patients.  Return to clinic after surgery to discuss final pathology report and to determine if MammaPrint will be sent out.

## 2021-07-28 NOTE — Progress Notes (Signed)
[facility] Clinical Social Work  Initial Assessment   Tonya Haynes is a 60 y.o. year old female accompanied by husband. Clinical Social Work was referred by [facility] for assessment of psychosocial needs.   SDOH (Social Determinants of Health) assessments performed: Yes SDOH Interventions    Flowsheet Row Most Recent Value  SDOH Interventions   Financial Strain Interventions Intervention Not Indicated  Housing Interventions Intervention Not Indicated  Transportation Interventions Intervention Not Indicated       Distress Screen completed: Yes ONCBCN DISTRESS SCREENING 07/28/2021  Screening Type Initial Screening  Distress experienced in past week (1-10) 7  Information Concerns Type Lack of info about diagnosis;Lack of info about treatment      Family/Social Information:  Housing Arrangement: patient lives with husband Tonya Haynes, and one son who is in college is often there.  Family members/support persons in your life? Family, Friends/Colleagues, and Church. Three children, one college age, one in [place] and one in [place]. Both pt and her husband are from [facility] and have a lot of close extended family there. Transportation concerns: no  Employment: Working full time as an Dietitian, self-employed. Work is somewhat flexible but high level of responsibility.  Income source: Employment Financial concerns: No Type of concern: None Food access concerns: no Religious or spiritual practice: yes, attend church (less since covid). Medication Concerns: no  Services Currently in place:  n/a  Coping/ Adjustment to diagnosis: Patient understands treatment plan and what happens next? yes Concerns about diagnosis and/or treatment: Overwhelmed by information and managing work responsibilities during treatment.  Patient reported stressors: Adjusting to my illness Patient enjoys cooking, family dinner, going to theater shows and parks, [place]  Current coping skills/ strengths: Ability for insight , Average or above average intelligence , Motivation for treatment/growth , and Supportive family/friends     SUMMARY: Current SDOH Barriers:  None identified at this time   Interventions: Discussed common feeling and emotions when being diagnosed with cancer, and the importance of support during treatment Informed patient of the support team roles and support services at [facility] Provided [place] contact information and encouraged patient to call with any questions or concerns    Follow Up Plan: Patient will contact CSW with any support or resource needs Patient verbalizes understanding of plan: Yes   Rosary Lively, Social Work Intern Ander Purpura Coralie Common , LCSW

## 2021-07-28 NOTE — H&P (View-Only) (Signed)
Subjective   Chief Complaint: Breast Cancer   PCP - Dr. Harlan Stains [facility] - 07/28/21 - [facility] [place]  History of Present Illness: Tonya Haynes is a 60 y.o. female who is seen today as an office consultation at the request of Dr. Dema Severin for evaluation of Breast Cancer .    This is a 60 year old female who recently underwent screening mammogram on 06/18/21 that revealed 0.7 x 0.6 x 0.4 cm area of architectural distortion in the right breast 12:00 8 cmfn.  Diagnostic mammogram and [place] were suspicious.  A single right axillary lymph node was thickened.  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  The axillary lymph node was suprisingly positive for metastatic disease.    Her mother had breast cancer at age 30 and underwent surgery/ radiation/ chemo.  She passed away at age 17 of cardiac complications.    She has left bundle branch block and is managed by Cardiology.     Review of Systems: A complete review of systems was obtained from the patient.  I have reviewed this information and discussed as appropriate with the patient.  See HPI as well for other ROS.  Review of Systems  Constitutional: Negative.   HENT: Negative.   Eyes: Negative.   Respiratory: Negative.   Cardiovascular: Negative.   Gastrointestinal: Negative.   Genitourinary: Negative.   Musculoskeletal: Negative.   Skin: Negative.   Neurological: Negative.   Endo/Heme/Allergies: Negative.   Psychiatric/Behavioral: The patient is nervous/anxious.       Medical History: Past Medical History:  Diagnosis Date   Anxiety    Asthma, unspecified asthma severity, unspecified whether complicated, unspecified whether persistent    History of cancer    Hyperlipidemia    Hypertension     Patient Active Problem List  Diagnosis   Malignant neoplasm of upper-outer quadrant of right breast in female,  estrogen receptor positive (CMS-HCC)   Hypertension   History of basal cell carcinoma   Asthma   Anxiety    Past Surgical History:  Procedure Laterality Date   Basal Cell Carcinoma Excision N/A    Date Unknown     No Known Allergies  Current Outpatient Medications on File Prior to Visit  Medication Sig Dispense Refill   hydroCHLOROthiazide (HYDRODIURIL) 25 MG tablet Take 1 tablet by mouth once daily     lisinopriL (ZESTRIL) 10 MG tablet Take 1 tablet by mouth once daily     nebivoloL (BYSTOLIC) 10 MG tablet Take 1 tablet by mouth once daily     potassium chloride (KLOR-CON) 10 MEQ ER tablet Take by mouth     rosuvastatin (CRESTOR) 10 MG tablet Take 1 tablet by mouth once daily     cetirizine (ZYRTEC) 10 mg capsule Take 1 tablet by mouth once daily as needed     multivitamin tablet Take 1 capsule by mouth once daily     No current facility-administered medications on file prior to visit.    Family History  Problem Relation Age of Onset   High blood pressure (Hypertension) Mother    Breast cancer Mother    Skin cancer Father    Hyperlipidemia (Elevated cholesterol) Father    High blood pressure (Hypertension) Father    Stroke Maternal Grandfather      Social History   Tobacco Use  Smoking Status Never Smoker  Smokeless Tobacco Never Used     Social  History   Socioeconomic History   Marital status: Unknown  Tobacco Use   Smoking status: Never Smoker   Smokeless tobacco: Never Used  Vaping Use   Vaping Use: Never used  Substance and Sexual Activity   Alcohol use: Yes    Comment: one or two a month   Drug use: Never    Objective:    There were no vitals filed for this visit.  There is no height or weight on file to calculate BMI.  Physical Exam   Constitutional:  WDWN in NAD, conversant, no obvious deformities; lying in bed comfortably Eyes:  Pupils equal, round; sclera anicteric; moist conjunctiva; no lid lag HENT:  Oral mucosa moist; good dentition   Neck:  No masses palpated, trachea midline; no thyromegaly Lungs:  CTA bilaterally; normal respiratory effort Breasts:  symmetric, no nipple changes; no lymphadenopathy on either side; she does have bilateral accessory breast tissue or lipomatous tissue in both axillae.  Significant ecchymosis in the upper right breast with palpable hematoma.  No left breast masses CV:  Regular rate and rhythm; no murmurs; extremities well-perfused with no edema Abd:  +bowel sounds, soft, non-tender, no palpable organomegaly; no palpable hernias Musc:  normal gait; no apparent clubbing or cyanosis in extremities Lymphatic:  No palpable cervical or axillary lymphadenopathy Skin:  Warm, dry; no sign of jaundice Psychiatric - alert and oriented x 4; calm mood and affect     Assessment and Plan:  Diagnoses and all orders for this visit:  Malignant neoplasm of upper-inner quadrant of right breast in female, estrogen receptor positive (CMS-HCC)     Right radioactive seed localized lumpectomy/ right targeted axillary lymph node dissection/ right sentinel lymph node biopsy.  The surgical procedure has been discussed with the patient.  Potential risks, benefits, alternative treatments, and expected outcomes have been explained.  All of the patient's questions at this time have been answered.  The likelihood of reaching the patient's treatment goal is good.  The patient understand the proposed surgical procedure and wishes to proceed.  Mammaprint to determine need for possible adjuvant chemotherapy. Adjuvant radiation/ anti-estrogens Genetics  Gabryel Talamo Jearld Adjutant, MD  07/28/2021 11:31 AM

## 2021-07-28 NOTE — Patient Instructions (Signed)

## 2021-07-29 ENCOUNTER — Encounter: Payer: Self-pay | Admitting: Genetic Counselor

## 2021-07-29 ENCOUNTER — Encounter: Payer: Self-pay | Admitting: *Deleted

## 2021-07-29 DIAGNOSIS — Z170 Estrogen receptor positive status [ER+]: Secondary | ICD-10-CM

## 2021-07-29 DIAGNOSIS — Z803 Family history of malignant neoplasm of breast: Secondary | ICD-10-CM | POA: Insufficient documentation

## 2021-07-29 NOTE — Progress Notes (Signed)
REFERRING PROVIDER: Nicholas Lose, MD [address] [place], [place] [address]  PRIMARY PROVIDER:  Harlan Stains, MD  PRIMARY REASON FOR VISIT:  1. Malignant neoplasm of upper-outer quadrant of right breast in female, estrogen receptor positive ([place])   2. Family history of breast cancer     HISTORY OF PRESENT ILLNESS:   Tonya Haynes, a 60 y.o. female, was seen for a  cancer genetics consultation during the breast multidisciplinary clinic at the request of Dr. Dema Severin due to a personal and family history of cancer.  Tonya Haynes presents to clinic today to discuss the possibility of a hereditary predisposition to cancer, to discuss genetic testing, and to further clarify her future cancer risks, as well as potential cancer risks for family members.   In October 2022, at the age of 27, Tonya Haynes was diagnosed with invasive mammary carcinoma of the right breast.  CANCER HISTORY:  Oncology History  Malignant neoplasm of upper-outer quadrant of right breast in female, estrogen receptor positive ([place])  07/13/2021 Initial Diagnosis   Screening mammogram: possible 0.6 cm distortion in the right breast. Diagnostic mammogram and [place]: 0.7 cm distortion in the right breast. Biopsy: grade 1/2 IDC with axillary lymph node (+) involved by metastatic carcinoma, ER+(95%) PR+(90%) Her2- Ki-67 1%   07/28/2021 Cancer Staging   Staging form: Breast, AJCC 8th Edition - Clinical stage from 07/28/2021: Stage IB (cT1b, cN1(f), cM0, G2, ER+, PR+, HER2-) - Signed by Nicholas Lose, MD on 07/28/2021 Stage prefix: Initial diagnosis Method of lymph node assessment: Fine needle aspiration Histologic grading system: 3 grade system      RISK FACTORS:  Menarche was at age 63.  First live birth at age 37.  Ovaries intact: yes.  Uterus intact: yes.  Menopausal status: postmenopausal.  HRT use: 0 years. Colonoscopy: no Mammogram within the last year: yes. Any excessive radiation exposure  in the past: no  Past Medical History:  Diagnosis Date   Anxiety    Asthma    Breast cancer ([place])    Diastolic dysfunction    History of basal cell carcinoma    Hypertension    LBBB (left bundle branch block)    Unspecified asthma(493.90)     Problem list entry automatically replaced. Please review for accuracy.    Past Surgical History:  Procedure Laterality Date   BASAL CELL CARCINOMA EXCISION     RIGHT DELTOID MUSCLE    Social History   Socioeconomic History   Marital status: Married    Spouse name: Not on file   Number of children: Not on file   Years of education: Not on file   Highest education level: Not on file  Occupational History   Not on file  Tobacco Use   Smoking status: Never   Smokeless tobacco: Never  Vaping Use   Vaping Use: Never used  Substance and Sexual Activity   Alcohol use: Yes   Drug use: No   Sexual activity: Not on file  Other Topics Concern   Not on file  Social History Narrative   Not on file   Social Determinants of Health   Financial Resource Strain: Low Risk    Difficulty of Paying Living Expenses: Not very hard  Food Insecurity: No Food Insecurity   Worried About Running Out of Food in the Last Year: Never true   Ran Out of Food in the Last Year: Never true  Transportation Needs: No Transportation Needs   Lack of Transportation (Medical): No   Lack  of Transportation (Non-Medical): No  Physical Activity: Not on file  Stress: Not on file  Social Connections: Not on file     FAMILY HISTORY:  We obtained a detailed, 4-generation family history.  Significant diagnoses are listed below: Family History  Problem Relation Age of Onset   Breast cancer Mother 80   Fibromyalgia Mother    Hypertension Father    Lung cancer Maternal Uncle        smoked   Stomach cancer Paternal Uncle        dx. 35s      Tonya Haynes is unaware of previous family history of genetic testing for hereditary cancer risks. There no reported  Ashkenazi Jewish ancestry.  GENETIC COUNSELING ASSESSMENT: Tonya Haynes is a 60 y.o. female with a personal and family history of cancer which is somewhat suggestive of a hereditary cancer syndrome and predisposition to cancer given multiple generations affected with breast cancer. We, therefore, discussed and recommended the following at today's visit.   DISCUSSION: We discussed that 5 - 10% of cancer is hereditary, with most cases of hereditary breast cancer associated with mutations in BRCA1/2.  There are other genes that can be associated with hereditary breast cancer syndromes. Type of cancer risk and level of risk are gene-specific. We discussed that testing is beneficial for several reasons including knowing how to follow individuals after completing their treatment, identifying whether potential treatment options would be beneficial, and understanding if other family members could be at risk for cancer and allowing them to undergo genetic testing.   We reviewed the characteristics, features and inheritance patterns of hereditary cancer syndromes. We also discussed genetic testing, including the appropriate family members to test, the process of testing, insurance coverage and turn-around-time for results. We discussed the implications of a negative, positive and/or variant of uncertain significant result. In order to get genetic test results in a timely manner so that Tonya Haynes can use these genetic test results for surgical decisions, we recommended Tonya Haynes pursue genetic testing for the BRCAplus panel. Once complete, we recommend Tonya Haynes pursue reflex genetic testing to a more comprehensive gene panel.   Tonya Haynes  was offered a common hereditary cancer panel (36 genes) and an expanded pan-cancer panel (77 genes). Tonya Haynes was informed of the benefits and limitations of each panel, including that expanded pan-cancer panels contain genes that do not have clear  management guidelines at this point in time.  We also discussed that as the number of genes included on a panel increases, the chances of variants of uncertain significance increases.  After considering the benefits and limitations of each gene panel, Tonya Haynes elected to have [facility] CancerNext Panel (36 genes).   The CancerNext gene panel offered by [employer] includes sequencing, rearrangement analysis, and RNA analysis for the following 36 genes:   APC, ATM, AXIN2, BARD1, BMPR1A, BRCA1, BRCA2, BRIP1, CDH1, CDK4, CDKN2A, CHEK2, DICER1, HOXB13, EPCAM, GREM1, MLH1, MSH2, MSH3, MSH6, MUTYH, NBN, NF1, NTHL1, PALB2, PMS2, POLD1, POLE, PTEN, RAD51C, RAD51D, RECQL, SMAD4, SMARCA4, STK11, and TP53.   Based on Tonya Haynes's personal and family history of cancer, she does not meet medical criteria for genetic testing. Therefore, she may still have an out of pocket cost. We discussed that if her out of pocket cost for testing is over $100, the laboratory should contact them to discuss self-pay prices, patient pay assistance programs, if applicable, and other billing options.   PLAN: After considering the risks, benefits, and limitations, Tonya Haynes  provided informed consent to pursue genetic testing and the blood sample was sent to [facility] for analysis of the CancerNext Panel+RNA (36 genes). Results should be available within approximately 1-2 weeks' time, at which point they will be disclosed by telephone to Tonya Haynes, as will any additional recommendations warranted by these results. Tonya Haynes will receive a summary of her genetic counseling visit and a copy of her results once available. This information will also be available in Epic.   Ms. Reliford questions were answered to her satisfaction today. Our contact information was provided should additional questions or concerns arise. Thank you for the referral and allowing [place] to share in the care of your patient.   Lucille Passy, MS, [facility] Genetic Counselor [email or web address] (P) [phone]  The patient was seen for a total of 20 minutes in face-to-face genetic counseling. The patient brought her husband. Drs. Magrinat, Lindi Adie and/or Burr Medico were available to discuss this case as needed.  _______________________________________________________________________ For Office Staff:  Number of people involved in session: 2 Was an Intern/ student involved with case: no

## 2021-07-29 NOTE — Telephone Encounter (Signed)
    Patient Name: Tonya Haynes  DOB: 06/10/1961 MRN: [id number]  Primary Cardiologist: Mertie Moores, MD  Chart reviewed as part of pre-operative protocol coverage. Per Dr. Acie Fredrickson, [name] would be at acceptable risk for the planned procedure without further cardiovascular testing. She does not need to complete the scheduled echocardiogram prior to surgery.   I will route this recommendation to the requesting party via Epic fax function and remove from pre-op pool.  Please call with questions.  Abigail Butts, PA-C 07/29/2021, 8:45 AM

## 2021-08-02 ENCOUNTER — Ambulatory Visit
Admission: RE | Admit: 2021-08-02 | Discharge: 2021-08-02 | Disposition: A | Discharge status: Home / Self Care | Payer: BC Managed Care – PPO | Source: Ambulatory Visit | Attending: Surgery | Admitting: Surgery

## 2021-08-02 ENCOUNTER — Other Ambulatory Visit: Payer: Self-pay

## 2021-08-02 DIAGNOSIS — Z170 Estrogen receptor positive status [ER+]: Secondary | ICD-10-CM

## 2021-08-02 DIAGNOSIS — C50411 Malignant neoplasm of upper-outer quadrant of right female breast: Secondary | ICD-10-CM

## 2021-08-02 MED ORDER — GADOBUTROL 1 MMOL/ML IV SOLN
9.0000 mL | Freq: Once | INTRAVENOUS | Status: AC | PRN
  Administered 2021-08-02: 9 mL via INTRAVENOUS

## 2021-08-03 ENCOUNTER — Other Ambulatory Visit: Payer: Self-pay | Admitting: *Deleted

## 2021-08-03 ENCOUNTER — Telehealth: Payer: Self-pay | Admitting: Hematology and Oncology

## 2021-08-03 ENCOUNTER — Encounter: Payer: Self-pay | Admitting: *Deleted

## 2021-08-03 ENCOUNTER — Other Ambulatory Visit: Payer: Self-pay | Admitting: Hematology and Oncology

## 2021-08-03 DIAGNOSIS — C50411 Malignant neoplasm of upper-outer quadrant of right female breast: Secondary | ICD-10-CM

## 2021-08-03 DIAGNOSIS — Z170 Estrogen receptor positive status [ER+]: Secondary | ICD-10-CM

## 2021-08-03 MED ORDER — LETROZOLE 2.5 MG PO TABS: 2.5000 mg | ORAL_TABLET | Freq: Every day | ORAL | 3 refills | Status: DC

## 2021-08-03 NOTE — Telephone Encounter (Signed)
Telephone call I discussed with the patient the result of the breast MRI showing additional areas of non-mass enhancement which will need to be further evaluated by additional biopsies. I discussed with Dr. Georgette Dover and we decided that she is not eligible for an immediate lumpectomy.  She might need neoadjuvant therapy if 8 cm of non-mass enhancement is also malignancy.  We discussed the options of chemotherapy versus antiestrogen therapy.  We would like to send a MammaPrint on the biopsy material to get that answer. In the interim I would like to start her on antiestrogen therapy and I sent a prescription for letrozole.

## 2021-08-04 ENCOUNTER — Encounter: Payer: Self-pay | Admitting: *Deleted

## 2021-08-04 ENCOUNTER — Telehealth: Payer: Self-pay | Admitting: *Deleted

## 2021-08-04 NOTE — Progress Notes (Signed)
[place] CSW Progress Notes  Social work Theatre manager contacted patient by phone per referral from [employer] nurse navigator [place].  Patient expressed frustration that it feels as if the progress made during [facility] has now been lost and she has to start back at square one re her information about illness and treatment plan. 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  Patient reports that it is difficult to care for herself when she's upset, and that all of this comes on top of demands at work. Intern and patient discussed challenges of facing uncertainty and importance of self care and asking for support. Intern encouraged patient to reach out with any questions or concerns.  Rosary Lively, Social Work Intern Supervised by Gwinda Maine, LCSW

## 2021-08-04 NOTE — Telephone Encounter (Signed)
Received order for Mammaprint testing on core bx. Requisition faxed to [facility] and [facility]  Spoke to pt concerning [place] from 10.26.22. Discussed MRI bx and mammaprint testing on care bx. Discussed pending results, will determine next steps.

## 2021-08-06 ENCOUNTER — Encounter: Payer: Self-pay | Admitting: *Deleted

## 2021-08-06 ENCOUNTER — Encounter: Payer: Self-pay | Admitting: Genetic Counselor

## 2021-08-06 DIAGNOSIS — Z1379 Encounter for other screening for genetic and chromosomal anomalies: Secondary | ICD-10-CM | POA: Insufficient documentation

## 2021-08-09 ENCOUNTER — Ambulatory Visit: Payer: Self-pay | Admitting: Genetic Counselor

## 2021-08-09 DIAGNOSIS — Z170 Estrogen receptor positive status [ER+]: Secondary | ICD-10-CM

## 2021-08-09 NOTE — Progress Notes (Signed)
HPI:   [name] was previously seen in the [place] clinic due to a personal and family history of cancer and concerns regarding a hereditary predisposition to cancer. Please refer to our prior cancer genetics clinic note for more information regarding our discussion, assessment and recommendations, at the time. [name] recent genetic test results were disclosed to her, as were recommendations warranted by these results. These results and recommendations are discussed in more detail below.  CANCER HISTORY:  Oncology History  Malignant neoplasm of upper-outer quadrant of right breast in female, estrogen receptor positive ([place])  07/13/2021 Initial Diagnosis   Screening mammogram: possible 0.6 cm distortion in the right breast. Diagnostic mammogram and [place]: 0.7 cm distortion in the right breast. Biopsy: grade 1/2 IDC with axillary lymph node (+) involved by metastatic carcinoma, ER+(95%) PR+(90%) Her2- Ki-67 1%   07/28/2021 Cancer Staging   Staging form: Breast, AJCC 8th Edition - Clinical stage from 07/28/2021: Stage IB (cT1b, cN1(f), cM0, G2, ER+, PR+, HER2-) - Signed by Nicholas Lose, MD on 07/28/2021 Stage prefix: Initial diagnosis Method of lymph node assessment: Fine needle aspiration Histologic grading system: 3 grade system     Genetic Testing   [facility] CancerNext (36 genes) was Negative. Report date is 08/05/2021.  The CancerNext gene panel offered by [employer] includes sequencing, rearrangement analysis, and RNA analysis for the following 36 genes:   APC, ATM, AXIN2, BARD1, BMPR1A, BRCA1, BRCA2, BRIP1, CDH1, CDK4, CDKN2A, CHEK2, DICER1, HOXB13, EPCAM, GREM1, MLH1, MSH2, MSH3, MSH6, MUTYH, NBN, NF1, NTHL1, PALB2, PMS2, POLD1, POLE, PTEN, RAD51C, RAD51D, RECQL, SMAD4, SMARCA4, STK11, and TP53.      FAMILY HISTORY:  We obtained a detailed, 4-generation family history.  Significant diagnoses are listed below:      Family History  Problem Relation Age  of Onset   Breast cancer Mother 54   Fibromyalgia Mother     Hypertension Father     Lung cancer Maternal Uncle          smoked   Stomach cancer Paternal Uncle          dx. 38s        [name] is unaware of previous family history of genetic testing for hereditary cancer risks. There no reported Ashkenazi Jewish ancestry.  GENETIC TEST RESULTS:  The [facility] CancerNext Panel found no pathogenic mutations.   The CancerNext gene panel offered by [employer] includes sequencing, rearrangement analysis, and RNA analysis for the following 36 genes:  APC, ATM, AXIN2, BARD1, BMPR1A, BRCA1, BRCA2, BRIP1, CDH1, CDK4, CDKN2A, CHEK2, DICER1, HOXB13, EPCAM, GREM1, MLH1, MSH2, MSH3, MSH6, MUTYH, NBN, NF1, NTHL1, PALB2, PMS2, POLD1, POLE, PTEN, RAD51C, RAD51D, RECQL, SMAD4, SMARCA4, STK11, and TP53.   The test report has been scanned into EPIC and is located under the Molecular Pathology section of the Results Review tab.  A portion of the result report is included below for reference. Genetic testing reported out on 08/05/2021.       Even though a pathogenic variant was not identified, possible explanations for the cancer in the family may include: There may be no hereditary risk for cancer in the family. The cancers in [name] ]and/or her family may be due to other genetic or environmental factors. There may be a gene mutation in one of these genes that current testing methods cannot detect, but that chance is small. There could be another gene that has not yet been discovered, or that we have not yet tested, that is responsible  for the cancer diagnoses in the family.  It is also possible there is a hereditary cause for the cancer in the family that [name] did not inherit.  Therefore, it is important to remain in touch with cancer genetics in the future so that we can continue to offer [name] the most up to date genetic testing.   ADDITIONAL GENETIC TESTING:  We  discussed with [name] that there are other genes that are associated with increased cancer risk that can be analyzed. Should [name] wish to pursue additional genetic testing, we are happy to discuss and coordinate this testing, at any time.    CANCER SCREENING RECOMMENDATIONS:  [name] test result is considered negative (normal).  This means that we have not identified a hereditary cause for her personal and family history of cancer at this time.   An individual's cancer risk and medical management are not determined by genetic test results alone. Overall cancer risk assessment incorporates additional factors, including personal medical history, family history, and any available genetic information that may result in a personalized plan for cancer prevention and surveillance. Therefore, it is recommended she continue to follow the cancer management and screening guidelines provided by her oncology and primary healthcare provider.  RECOMMENDATIONS FOR FAMILY MEMBERS:   Since she did not inherit a mutation in a cancer predisposition gene included on this panel, her children could not have inherited a mutation from her in one of these genes. Individuals in this family might be at some increased risk of developing cancer, over the general population risk, due to the family history of cancer. We recommend women in this family have a yearly mammogram beginning at age 33, or 14 years younger than the earliest onset of cancer, an annual clinical breast exam, and perform monthly breast self-exams.   FOLLOW-UP:  Cancer genetics is a rapidly advancing field and it is possible that new genetic tests will be appropriate for her and/or her family members in the future. We encouraged her to remain in contact with cancer genetics on an annual basis so we can update her personal and family histories and let her know of advances in cancer genetics that may benefit this family.   Our contact  number was provided. [name] questions were answered to her satisfaction, and she knows she is welcome to call us at anytime with additional questions or concerns.   Tonya Passy, MS, [facility] Genetic Counselor [email or web address] (P) [phone]

## 2021-08-10 ENCOUNTER — Other Ambulatory Visit (HOSPITAL_COMMUNITY): Payer: BC Managed Care – PPO

## 2021-08-11 ENCOUNTER — Ambulatory Visit
Admission: RE | Admit: 2021-08-11 | Discharge: 2021-08-11 | Disposition: A | Discharge status: Home / Self Care | Payer: BC Managed Care – PPO | Source: Ambulatory Visit | Attending: Hematology and Oncology | Admitting: Hematology and Oncology

## 2021-08-11 ENCOUNTER — Other Ambulatory Visit: Payer: Self-pay

## 2021-08-11 ENCOUNTER — Telehealth: Payer: Self-pay | Admitting: Hematology and Oncology

## 2021-08-11 DIAGNOSIS — Z170 Estrogen receptor positive status [ER+]: Secondary | ICD-10-CM

## 2021-08-11 DIAGNOSIS — C50411 Malignant neoplasm of upper-outer quadrant of right female breast: Secondary | ICD-10-CM

## 2021-08-11 HISTORY — PX: BREAST BIOPSY: SHX20

## 2021-08-11 MED ORDER — GADOBUTROL 1 MMOL/ML IV SOLN
10.0000 mL | Freq: Once | INTRAVENOUS | Status: AC | PRN
  Administered 2021-08-11: 10 mL via INTRAVENOUS

## 2021-08-11 NOTE — Telephone Encounter (Signed)
Sch per 1/11 inbasket, pt aware °

## 2021-08-13 ENCOUNTER — Ambulatory Visit: Payer: Self-pay | Admitting: Surgery

## 2021-08-13 ENCOUNTER — Telehealth: Payer: Self-pay | Admitting: *Deleted

## 2021-08-13 DIAGNOSIS — C50911 Malignant neoplasm of unspecified site of right female breast: Secondary | ICD-10-CM

## 2021-08-13 NOTE — Telephone Encounter (Signed)
Received Mammaprint results of LOW RISK on core bx. Physician team notified. Informed pt of results and next step of sx.

## 2021-08-14 NOTE — Progress Notes (Addendum)
Patient Care Team: Harlan Stains, MD as PCP - General (Family Medicine) Nahser, Wonda Cheng, MD as PCP - Cardiology (Cardiology) Rockwell Germany, RN as Oncology Nurse Navigator Mauro Kaufmann, RN as Oncology Nurse Navigator Donnie Mesa, MD as Consulting Physician (General Surgery) Nicholas Lose, MD as Consulting Physician (Hematology and Oncology) Eppie Gibson, MD as Attending Physician (Radiation Oncology)  DIAGNOSIS:    ICD-10-CM   1. Malignant neoplasm of upper-outer quadrant of right breast in female, estrogen receptor positive ([place])  C50.411    Z17.0       SUMMARY OF ONCOLOGIC HISTORY: Oncology History  Malignant neoplasm of upper-outer quadrant of right breast in female, estrogen receptor positive ([place])  07/13/2021 Initial Diagnosis   Screening mammogram: possible 0.6 cm distortion in the right breast. Diagnostic mammogram and [place]: 0.7 cm distortion in the right breast. Biopsy: grade 1/2 IDC with axillary lymph node (+) involved by metastatic carcinoma, ER+(95%) PR+(90%) Her2- Ki-67 1%   07/28/2021 Cancer Staging   Staging form: Breast, AJCC 8th Edition - Clinical stage from 07/28/2021: Stage IB (cT1b, cN1(f), cM0, G2, ER+, PR+, HER2-) - Signed by Nicholas Lose, MD on 07/28/2021 Stage prefix: Initial diagnosis Method of lymph node assessment: Fine needle aspiration Histologic grading system: 3 grade system     Genetic Testing   [facility] CancerNext (36 genes) was Negative. Report date is 08/05/2021.  The CancerNext gene panel offered by [employer] includes sequencing, rearrangement analysis, and RNA analysis for the following 36 genes:   APC, ATM, AXIN2, BARD1, BMPR1A, BRCA1, BRCA2, BRIP1, CDH1, CDK4, CDKN2A, CHEK2, DICER1, HOXB13, EPCAM, GREM1, MLH1, MSH2, MSH3, MSH6, MUTYH, NBN, NF1, NTHL1, PALB2, PMS2, POLD1, POLE, PTEN, RAD51C, RAD51D, RECQL, SMAD4, SMARCA4, STK11, and TP53.      CHIEF COMPLIANT: Follow-up of right breast cancer  INTERVAL HISTORY: Tonya Haynes is a 60 y.o. with above-mentioned history of right breast cancer. Biopsy on 08/11/2021 showed invasive mammary carcinoma. She presents to the clinic today for follow-up.   ALLERGIES:  has No Known Allergies.  MEDICATIONS:  Current Outpatient Medications  Medication Sig Dispense Refill   acetaminophen (TYLENOL) 500 MG tablet Take 1,000 mg by mouth every 6 (six) hours as needed for moderate pain.     fexofenadine (ALLEGRA) 180 MG tablet Take 180 mg by mouth daily as needed for allergies or rhinitis.     hydrochlorothiazide (HYDRODIURIL) 25 MG tablet Take 1 tablet (25 mg total) by mouth daily. 90 tablet 1   letrozole (FEMARA) 2.5 MG tablet Take 1 tablet (2.5 mg total) by mouth daily. (Patient taking differently: Take 2.5 mg by mouth every evening.) 90 tablet 3   lisinopril (ZESTRIL) 10 MG tablet Take 1 tablet (10 mg total) by mouth daily. 90 tablet 1   Multiple Vitamin (MULTI-VITAMIN PO) Take 1 capsule by mouth daily.      nebivolol (BYSTOLIC) 10 MG tablet Take 1 tablet (10 mg total) by mouth daily. 90 tablet 1   potassium chloride (KLOR-CON) 10 MEQ tablet Take 1 tablet (10 mEq total) by mouth daily. 90 tablet 1   rosuvastatin (CRESTOR) 10 MG tablet Take 1 tablet (10 mg total) by mouth daily. 90 tablet 1   No current facility-administered medications for this visit.    PHYSICAL EXAMINATION: ECOG PERFORMANCE STATUS: 1 - Symptomatic but completely ambulatory  Vitals:   08/16/21 0958  BP: (!) 171/96  Pulse: 84  Resp: 18  Temp: (!) 97.2 F (36.2 C)  SpO2: 99%   Filed Weights   08/16/21 0958  Weight: 202 lb 14.4 oz (92 kg)      LABORATORY DATA:  I have reviewed the data as listed CMP Latest Ref Rng & Units 07/28/2021 10/11/2019 07/17/2019  Glucose 70 - 99 mg/dL 120(H) 102(H) 100(H)  BUN 6 - 20 mg/dL _0 Creatinine 0.44 - 1.00 mg/dL 1.09(H) 1.07(H) 1.02(H)  Sodium 135 - 145 mmol/L 136 138 140  Potassium 3.5 - 5.1 mmol/L 4.3 4.5 4.5  Chloride 98 - 111 mmol/L 100 100  103  CO2 22 - 32 mmol/L _1 Calcium 8.9 - 10.3 mg/dL 9.9 9.8 9.5  Total Protein 6.5 - 8.1 g/dL 7.7 - -  Total Bilirubin 0.3 - 1.2 mg/dL 0.9 - -  Alkaline Phos 38 - 126 U/L 82 - -  AST 15 - 41 U/L 23 - -  ALT 0 - 44 U/L 22 - -    Lab Results  Component Value Date   WBC 6.1 07/28/2021   HGB 13.7 07/28/2021   HCT 39.7 07/28/2021   MCV 86.7 07/28/2021   PLT 133 (L) 07/28/2021   NEUTROABS 4.2 07/28/2021    ASSESSMENT & PLAN:  Malignant neoplasm of upper-outer quadrant of right breast in female, estrogen receptor positive ([place]) 07/13/2021: Screening mammogram: possible 0.6 cm distortion in the right breast. Diagnostic mammogram and [place]: 0.7 cm distortion in the right breast. 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  Treatment plan: If the final positive lymph nodes are 3 or less, then she would not need chemotherapy. Adjuvant radiation therapy Followed by adjuvant antiestrogen therapy plus or minus Verzenio  Surgery has been set up for 08/19/2021 Return to clinic after surgery to discuss pathology report on 08/30/2021  No orders of the defined types were placed in this encounter.  The patient has a good understanding of the overall plan. she agrees with it. she will call with any problems that may develop before the next visit here.  Total time spent: 30 mins including face to face time and time spent for planning, charting and coordination of care  Rulon Eisenmenger, MD, MPH 08/16/2021  I, Thana Ates, am acting as scribe for Dr. Nicholas Lose.  I have reviewed the above documentation for accuracy and completeness, and I agree with the above.

## 2021-08-15 NOTE — Assessment & Plan Note (Signed)
07/13/2021: Screening mammogram: possible 0.6 cm distortion in the right breast. Diagnostic mammogram and [place]: 0.7 cm distortion in the right breast. Biopsy: grade 1/2 IDC with axillary lymph node (+) involved by metastatic carcinoma, ER+(95%) PR+(90%) Her2- Ki-67 1% Mammaprint: Low Risk

## 2021-08-16 ENCOUNTER — Other Ambulatory Visit: Payer: Self-pay

## 2021-08-16 ENCOUNTER — Ambulatory Visit: Payer: Self-pay | Admitting: Surgery

## 2021-08-16 ENCOUNTER — Inpatient Hospital Stay: Payer: BC Managed Care – PPO | Attending: Hematology and Oncology | Admitting: Hematology and Oncology

## 2021-08-16 DIAGNOSIS — Z79899 Other long term (current) drug therapy: Secondary | ICD-10-CM | POA: Diagnosis not present

## 2021-08-16 DIAGNOSIS — Z170 Estrogen receptor positive status [ER+]: Secondary | ICD-10-CM | POA: Insufficient documentation

## 2021-08-16 DIAGNOSIS — C50411 Malignant neoplasm of upper-outer quadrant of right female breast: Secondary | ICD-10-CM | POA: Insufficient documentation

## 2021-08-16 DIAGNOSIS — C773 Secondary and unspecified malignant neoplasm of axilla and upper limb lymph nodes: Secondary | ICD-10-CM | POA: Insufficient documentation

## 2021-08-16 NOTE — Pre-Procedure Instructions (Signed)
Surgical Instructions    Your procedure is scheduled on 08/19/21.  Report to [facility] Main Entrance "A" at 07:30 A.M., then check in with the Admitting office.  Call this number if you have problems the morning of surgery:  [phone]   If you have any questions prior to your surgery date call [phone]: Open Monday-Friday 8am-4pm    Remember:  Do not eat after midnight the night before your surgery  You may drink clear liquids until 06:30 the morning of your surgery.   Clear liquids allowed are: Water, Non-Citrus Juices (without pulp), Carbonated Beverages, Clear Tea, Black Coffee ONLY (NO MILK, CREAM OR POWDERED CREAMER of any kind), and Gatorade    Take these medicines the morning of surgery with A SIP OF WATER  fexofenadine (ALLEGRA) nebivolol (BYSTOLIC) rosuvastatin (CRESTOR) letrozole [facility])  As of today, STOP taking any Aspirin (unless otherwise instructed by your surgeon) Aleve, Naproxen, Ibuprofen, Motrin, Advil, Goody's, BC's, all herbal medications, fish oil, and all vitamins.   After your COVID test   You are not required to quarantine however you are required to wear a well-fitting mask when you are out and around people not in your household.  If your mask becomes wet or soiled, replace with a new one.  Wash your hands often with soap and water for 20 seconds or clean your hands with an alcohol-based hand sanitizer that contains at least 60% alcohol.  Do not share personal items.  Notify your provider: if you are in close contact with someone who has COVID  or if you develop a fever of 100.4 or greater, sneezing, cough, sore throat, shortness of breath or body aches.           Do not wear jewelry or makeup Do not wear lotions, powders, perfumes/colognes, or deodorant. Do not shave 48 hours prior to surgery.  Men may shave face and neck. Do not bring valuables to the hospital. DO Not wear nail polish, gel polish, artificial nails, or any other type of  covering on natural nails including finger and toenails. If patients have artificial nails, gel coating, etc. that need to be removed by a nail salon, please have this removed prior to surgery or surgery may need to be canceled/delayed if the surgeon/ anesthesia feels like the patient is unable to be adequately monitored.             [facility] is not responsible for any belongings or valuables.  Do NOT Smoke (Tobacco/Vaping)  24 hours prior to your procedure  If you use a CPAP at night, you may bring your mask for your overnight stay.   Contacts, glasses, hearing aids, dentures or partials may not be worn into surgery, please bring cases for these belongings   For patients admitted to the hospital, discharge time will be determined by your treatment team.   Patients discharged the day of surgery will not be allowed to drive home, and someone needs to stay with them for 24 hours.  NO VISITORS WILL BE ALLOWED IN PRE-OP WHERE PATIENTS ARE PREPPED FOR SURGERY.  ONLY 1 SUPPORT PERSON MAY BE PRESENT IN THE WAITING ROOM WHILE YOU ARE IN SURGERY.  IF YOU ARE TO BE ADMITTED, ONCE YOU ARE IN YOUR ROOM YOU WILL BE ALLOWED TWO (2) VISITORS. 1 (ONE) VISITOR MAY STAY OVERNIGHT BUT MUST ARRIVE TO THE ROOM BY 8pm.  Minor children may have two parents present. Special consideration for safety and communication needs will be reviewed on a case  by case basis.  Special instructions:    Oral Hygiene is also important to reduce your risk of infection.  Remember - BRUSH YOUR TEETH THE MORNING OF SURGERY WITH YOUR REGULAR TOOTHPASTE   [facility]- Preparing For Surgery  Before surgery, you can play an important role. Because skin is not sterile, your skin needs to be as free of germs as possible. You can reduce the number of germs on your skin by washing with CHG (chlorahexidine gluconate) Soap before surgery.  CHG is an antiseptic cleaner which kills germs and bonds with the skin to continue killing germs even  after washing.     Please do not use if you have an allergy to CHG or antibacterial soaps. If your skin becomes reddened/irritated stop using the CHG.  Do not shave (including legs and underarms) for at least 48 hours prior to first CHG shower. It is OK to shave your face.  Please follow these instructions carefully.     Shower the NIGHT BEFORE SURGERY and the MORNING OF SURGERY with CHG Soap.   If you chose to wash your hair, wash your hair first as usual with your normal shampoo. After you shampoo, rinse your hair and body thoroughly to remove the shampoo.  Then [employer] and genitals (private parts) with your normal soap and rinse thoroughly to remove soap.  After that Use CHG Soap as you would any other liquid soap. You can apply CHG directly to the skin and wash gently with a scrungie or a clean washcloth.   Apply the CHG Soap to your body ONLY FROM THE NECK DOWN.  Do not use on open wounds or open sores. Avoid contact with your eyes, ears, mouth and genitals (private parts). Wash Face and genitals (private parts)  with your normal soap.   Wash thoroughly, paying special attention to the area where your surgery will be performed.  Thoroughly rinse your body with warm water from the neck down.  DO NOT shower/wash with your normal soap after using and rinsing off the CHG Soap.  Pat yourself dry with a CLEAN TOWEL.  Wear CLEAN PAJAMAS to bed the night before surgery  Place CLEAN SHEETS on your bed the night before your surgery  DO NOT SLEEP WITH PETS.   Day of Surgery:  Take a shower with CHG soap. Wear Clean/Comfortable clothing the morning of surgery Do not apply any deodorants/lotions.   Remember to brush your teeth WITH YOUR REGULAR TOOTHPASTE.   Please read over the following fact sheets that you were given.  Tonya Haynes

## 2021-08-17 ENCOUNTER — Other Ambulatory Visit: Payer: Self-pay

## 2021-08-17 ENCOUNTER — Encounter (HOSPITAL_COMMUNITY)
Admission: RE | Admit: 2021-08-17 | Discharge: 2021-08-17 | Disposition: A | Discharge status: Home / Self Care | Payer: BC Managed Care – PPO | Source: Ambulatory Visit | Attending: Surgery | Admitting: Surgery

## 2021-08-17 ENCOUNTER — Encounter (HOSPITAL_COMMUNITY): Payer: Self-pay | Admitting: *Deleted

## 2021-08-17 DIAGNOSIS — F419 Anxiety disorder, unspecified: Secondary | ICD-10-CM | POA: Diagnosis not present

## 2021-08-17 DIAGNOSIS — Z01812 Encounter for preprocedural laboratory examination: Secondary | ICD-10-CM | POA: Insufficient documentation

## 2021-08-17 DIAGNOSIS — J45909 Unspecified asthma, uncomplicated: Secondary | ICD-10-CM | POA: Diagnosis not present

## 2021-08-17 DIAGNOSIS — C773 Secondary and unspecified malignant neoplasm of axilla and upper limb lymph nodes: Secondary | ICD-10-CM | POA: Diagnosis not present

## 2021-08-17 DIAGNOSIS — I447 Left bundle-branch block, unspecified: Secondary | ICD-10-CM | POA: Diagnosis not present

## 2021-08-17 DIAGNOSIS — I10 Essential (primary) hypertension: Secondary | ICD-10-CM | POA: Diagnosis not present

## 2021-08-17 DIAGNOSIS — C50411 Malignant neoplasm of upper-outer quadrant of right female breast: Secondary | ICD-10-CM | POA: Diagnosis present

## 2021-08-17 DIAGNOSIS — Z803 Family history of malignant neoplasm of breast: Secondary | ICD-10-CM | POA: Diagnosis not present

## 2021-08-17 DIAGNOSIS — Z170 Estrogen receptor positive status [ER+]: Secondary | ICD-10-CM | POA: Diagnosis not present

## 2021-08-17 NOTE — Progress Notes (Signed)
PCP - Dr. Harlan Stains Cardiologist - Dr. Acie Fredrickson  B/P elevated at PAT appt  Patient has "white coat" syndrome checks regularly at home. Nov 7th was 117/76, Oct 29th 123/81  Chest x-ray - Not indicated EKG - 07/05/21 Stress Test - Denies ECHO - 106/16 Cardiac Cath - Denies  Sleep Study - Denies  DM - Denies   ERAS Protcol - Yes   Anesthesia review: Yes cardiac history LBBB   Patient denies shortness of breath, fever, cough and chest pain at PAT appointment   All instructions explained to the patient, with a verbal understanding of the material. Patient agrees to go over the instructions while at home for a better understanding.  The opportunity to ask questions was provided.

## 2021-08-18 ENCOUNTER — Encounter: Payer: Self-pay | Admitting: Hematology and Oncology

## 2021-08-18 ENCOUNTER — Ambulatory Visit: Admit: 2021-08-18 | Payer: BC Managed Care – PPO | Admitting: Surgery

## 2021-08-18 SURGERY — BREAST LUMPECTOMY WITH RADIOACTIVE SEED AND SENTINEL LYMPH NODE BIOPSY
Anesthesia: General | Site: Breast | Laterality: Right

## 2021-08-18 NOTE — Progress Notes (Signed)
Anesthesia Chart Review:  Follows with cardiology for history of hypertension and left bundle branch block.  Last echo October 2016 showed normal LV systolic function with EF 50 to 55%, grade 1 DD, mild MR.  Last seen by Dr. Acie Fredrickson 07/05/2021, stable at that time.  Cardiac clearance per telephone encounter 07/29/2021, "Chart reviewed as part of pre-operative protocol coverage. Per Dr. Acie Fredrickson, Tonya Haynes would be at acceptable risk for the planned procedure without further cardiovascular testing. She does not need to complete the scheduled echocardiogram prior to surgery."  CMP and CBC from 07/28/2021 reviewed, platelets mildly low at 133, otherwise unremarkable.  EKG 07/05/2021: NSR.  Rate 90.  Left bundle branch block.  TTE 07/09/2015: - Left ventricle: The cavity size was normal. There was moderate    focal basal hypertrophy of the septum. Systolic function was    mildly reduced. The estimated ejection fraction was in the range    of 50% to 55%. Doppler parameters are consistent with abnormal    left ventricular relaxation (grade 1 diastolic dysfunction).  - Ventricular septum: Septal motion showed abnormal function,    dyssynergy, and &quot;bounce&quot;.  - Mitral valve: There was mild regurgitation.  - Right ventricle: The cavity size was normal. Wall thickness was    normal.  - Pulmonary arteries: PA peak pressure: 24 mm Hg (S).  - Inferior vena cava: The vessel was normal in size. The    respirophasic diameter changes were in the normal range (= 50%),    consistent with normal central venous pressure.   Tonya Haynes [facility] Short Stay Center/Anesthesiology Phone [phone] 08/18/2021 8:52 AM

## 2021-08-18 NOTE — Anesthesia Preprocedure Evaluation (Addendum)
Anesthesia Evaluation  Patient identified by MRN, date of birth, ID band Patient awake    Reviewed: Allergy & Precautions, NPO status , Patient's Chart, lab work & pertinent test results, reviewed documented beta blocker date and time   Airway Mallampati: I  TM Distance: >3 FB Neck ROM: Full    Dental no notable dental hx. (+) Teeth Intact, Dental Advisory Given   Pulmonary asthma ,    Pulmonary exam normal breath sounds clear to auscultation       Cardiovascular hypertension, Pt. on medications and Pt. on home beta blockers Normal cardiovascular exam Rhythm:Regular Rate:Normal  EKG 07/05/2021: NSR.  Rate 90.  Left bundle branch block.  TTE 07/09/2015: - Left ventricle: The cavity size was normal. There was moderate  focal basal hypertrophy of the septum. Systolic function was  mildly reduced. The estimated ejection fraction was in the range  of 50% to 55%. Doppler parameters are consistent with abnormal  left ventricular relaxation (grade 1 diastolic dysfunction).  - Ventricular septum: Septal motion showed abnormal function,  dyssynergy, and "bounce".  - Mitral valve: There was mild regurgitation.  - Right ventricle: The cavity size was normal. Wall thickness was  normal.  - Pulmonary arteries: PA peak pressure: 24 mm Hg (S).  - Inferior vena cava: The vessel was normal in size. The  respirophasic diameter changes were in the normal range (= 50%),  consistent with normal central venous pressure.   Neuro/Psych PSYCHIATRIC DISORDERS Anxiety negative neurological ROS     GI/Hepatic negative GI ROS, Neg liver ROS,   Endo/Other  negative endocrine ROS  Renal/GU negative Renal ROS  negative genitourinary   Musculoskeletal negative musculoskeletal ROS (+)   Abdominal   Peds  Hematology negative hematology ROS (+)   Anesthesia Other Findings   Reproductive/Obstetrics                            Anesthesia Physical Anesthesia Plan  ASA: 2  Anesthesia Plan: General and Regional   Post-op Pain Management:  Regional for Post-op pain   Induction: Intravenous  PONV Risk Score and Plan: 3 and Ondansetron, Dexamethasone and Midazolam  Airway Management Planned: LMA  Additional Equipment:   Intra-op Plan:   Post-operative Plan: Extubation in OR  Informed Consent: I have reviewed the patients History and Physical, chart, labs and discussed the procedure including the risks, benefits and alternatives for the proposed anesthesia with the patient or authorized representative who has indicated his/her understanding and acceptance.     Dental advisory given  Plan Discussed with: CRNA  Anesthesia Plan Comments: ( )      Anesthesia Quick Evaluation

## 2021-08-19 ENCOUNTER — Other Ambulatory Visit: Payer: Self-pay

## 2021-08-19 ENCOUNTER — Encounter: Payer: Self-pay | Admitting: Hematology and Oncology

## 2021-08-19 ENCOUNTER — Encounter (HOSPITAL_COMMUNITY)
Admission: RE | Disposition: A | Discharge status: Home / Self Care | Payer: Self-pay | Source: Ambulatory Visit | Attending: Surgery

## 2021-08-19 ENCOUNTER — Ambulatory Visit (HOSPITAL_COMMUNITY): Payer: BC Managed Care – PPO | Admitting: Anesthesiology

## 2021-08-19 ENCOUNTER — Encounter: Payer: Self-pay | Admitting: *Deleted

## 2021-08-19 ENCOUNTER — Encounter (HOSPITAL_COMMUNITY): Payer: Self-pay | Admitting: Surgery

## 2021-08-19 ENCOUNTER — Ambulatory Visit (HOSPITAL_COMMUNITY): Payer: BC Managed Care – PPO | Admitting: Physician Assistant

## 2021-08-19 ENCOUNTER — Ambulatory Visit (HOSPITAL_COMMUNITY)
Admission: RE | Admit: 2021-08-19 | Discharge: 2021-08-19 | Disposition: A | Discharge status: Home / Self Care | Payer: BC Managed Care – PPO | Source: Ambulatory Visit | Attending: Surgery | Admitting: Surgery

## 2021-08-19 DIAGNOSIS — C50411 Malignant neoplasm of upper-outer quadrant of right female breast: Secondary | ICD-10-CM | POA: Insufficient documentation

## 2021-08-19 DIAGNOSIS — I447 Left bundle-branch block, unspecified: Secondary | ICD-10-CM | POA: Insufficient documentation

## 2021-08-19 DIAGNOSIS — F419 Anxiety disorder, unspecified: Secondary | ICD-10-CM | POA: Insufficient documentation

## 2021-08-19 DIAGNOSIS — Z803 Family history of malignant neoplasm of breast: Secondary | ICD-10-CM | POA: Insufficient documentation

## 2021-08-19 DIAGNOSIS — C773 Secondary and unspecified malignant neoplasm of axilla and upper limb lymph nodes: Secondary | ICD-10-CM | POA: Insufficient documentation

## 2021-08-19 DIAGNOSIS — I10 Essential (primary) hypertension: Secondary | ICD-10-CM | POA: Insufficient documentation

## 2021-08-19 DIAGNOSIS — J45909 Unspecified asthma, uncomplicated: Secondary | ICD-10-CM | POA: Insufficient documentation

## 2021-08-19 DIAGNOSIS — Z170 Estrogen receptor positive status [ER+]: Secondary | ICD-10-CM | POA: Insufficient documentation

## 2021-08-19 HISTORY — PX: AXILLARY SENTINEL NODE BIOPSY: SHX5738

## 2021-08-19 HISTORY — PX: RADIOACTIVE SEED GUIDED AXILLARY SENTINEL LYMPH NODE: SHX6735

## 2021-08-19 HISTORY — PX: BREAST LUMPECTOMY WITH RADIOACTIVE SEED AND SENTINEL LYMPH NODE BIOPSY: SHX6550

## 2021-08-19 SURGERY — BREAST LUMPECTOMY WITH RADIOACTIVE SEED AND SENTINEL LYMPH NODE BIOPSY
Anesthesia: Regional | Site: Breast | Laterality: Right

## 2021-08-19 MED ORDER — DEXAMETHASONE SODIUM PHOSPHATE 10 MG/ML IJ SOLN
INTRAMUSCULAR | Status: DC | PRN
  Administered 2021-08-19: 09:00:00 5 mg

## 2021-08-19 MED ORDER — CHLORHEXIDINE GLUCONATE CLOTH 2 % EX PADS
6.0000 | MEDICATED_PAD | Freq: Once | CUTANEOUS | Status: DC
Start: 2021-08-19 — End: 2021-08-19

## 2021-08-19 MED ORDER — PHENYLEPHRINE HCL-NACL 20-0.9 MG/250ML-% IV SOLN
INTRAVENOUS | Status: DC | PRN
  Administered 2021-08-19: 30 ug/min via INTRAVENOUS

## 2021-08-19 MED ORDER — BUPIVACAINE-EPINEPHRINE (PF) 0.25% -1:200000 IJ SOLN
INTRAMUSCULAR | Status: AC
  Filled 2021-08-19: qty 30

## 2021-08-19 MED ORDER — ROPIVACAINE HCL 5 MG/ML IJ SOLN
INTRAMUSCULAR | Status: DC | PRN
  Administered 2021-08-19: 30 mL via PERINEURAL

## 2021-08-19 MED ORDER — CEFAZOLIN SODIUM-DEXTROSE 2-4 GM/100ML-% IV SOLN
2.0000 g | INTRAVENOUS | Status: AC
  Administered 2021-08-19: 10:00:00 2 g via INTRAVENOUS

## 2021-08-19 MED ORDER — ORAL CARE MOUTH RINSE: 15.0000 mL | Freq: Once | OROMUCOSAL | Status: AC

## 2021-08-19 MED ORDER — LIDOCAINE 2% (20 MG/ML) 5 ML SYRINGE
INTRAMUSCULAR | Status: DC | PRN
  Administered 2021-08-19: 20 mg via INTRAVENOUS

## 2021-08-19 MED ORDER — PHENYLEPHRINE 40 MCG/ML (10ML) SYRINGE FOR IV PUSH (FOR BLOOD PRESSURE SUPPORT)
PREFILLED_SYRINGE | INTRAVENOUS | Status: AC
  Filled 2021-08-19: qty 10

## 2021-08-19 MED ORDER — FENTANYL CITRATE (PF) 250 MCG/5ML IJ SOLN
INTRAMUSCULAR | Status: AC
  Filled 2021-08-19: qty 5

## 2021-08-19 MED ORDER — MIDAZOLAM HCL 2 MG/2ML IJ SOLN: 2.0000 mg | Freq: Once | INTRAMUSCULAR | Status: AC

## 2021-08-19 MED ORDER — ONDANSETRON HCL 4 MG/2ML IJ SOLN
INTRAMUSCULAR | Status: DC | PRN
  Administered 2021-08-19: 4 mg via INTRAVENOUS

## 2021-08-19 MED ORDER — FENTANYL CITRATE (PF) 250 MCG/5ML IJ SOLN
INTRAMUSCULAR | Status: DC | PRN
  Administered 2021-08-19 (×5): 25 ug via INTRAVENOUS

## 2021-08-19 MED ORDER — BUPIVACAINE-EPINEPHRINE 0.25% -1:200000 IJ SOLN
INTRAMUSCULAR | Status: DC | PRN
  Administered 2021-08-19: 20 mL

## 2021-08-19 MED ORDER — FENTANYL CITRATE (PF) 100 MCG/2ML IJ SOLN: 50.0000 ug | Freq: Once | INTRAMUSCULAR | Status: AC

## 2021-08-19 MED ORDER — MIDAZOLAM HCL 2 MG/2ML IJ SOLN
INTRAMUSCULAR | Status: AC
  Administered 2021-08-19: 09:00:00 2 mg via INTRAVENOUS
  Filled 2021-08-19: qty 2

## 2021-08-19 MED ORDER — OXYCODONE HCL 5 MG PO TABS: 5.0000 mg | ORAL_TABLET | Freq: Four times a day (QID) | ORAL | 0 refills | Status: DC | PRN

## 2021-08-19 MED ORDER — CEFAZOLIN SODIUM-DEXTROSE 2-4 GM/100ML-% IV SOLN
INTRAVENOUS | Status: AC
  Filled 2021-08-19: qty 100

## 2021-08-19 MED ORDER — PHENYLEPHRINE 40 MCG/ML (10ML) SYRINGE FOR IV PUSH (FOR BLOOD PRESSURE SUPPORT)
PREFILLED_SYRINGE | INTRAVENOUS | Status: DC | PRN
  Administered 2021-08-19 (×3): 40 ug via INTRAVENOUS

## 2021-08-19 MED ORDER — LACTATED RINGERS IV SOLN: INTRAVENOUS | Status: DC

## 2021-08-19 MED ORDER — 0.9 % SODIUM CHLORIDE (POUR BTL) OPTIME
TOPICAL | Status: DC | PRN
  Administered 2021-08-19: 11:00:00 1000 mL

## 2021-08-19 MED ORDER — CHLORHEXIDINE GLUCONATE 0.12 % MT SOLN
OROMUCOSAL | Status: AC
  Administered 2021-08-19: 08:00:00 15 mL via OROMUCOSAL
  Filled 2021-08-19: qty 15

## 2021-08-19 MED ORDER — DEXAMETHASONE SODIUM PHOSPHATE 10 MG/ML IJ SOLN
INTRAMUSCULAR | Status: DC | PRN
  Administered 2021-08-19: 10 mg via INTRAVENOUS

## 2021-08-19 MED ORDER — CHLORHEXIDINE GLUCONATE CLOTH 2 % EX PADS: 6.0000 | MEDICATED_PAD | Freq: Once | CUTANEOUS | Status: DC

## 2021-08-19 MED ORDER — EPHEDRINE SULFATE-NACL 50-0.9 MG/10ML-% IV SOSY
PREFILLED_SYRINGE | INTRAVENOUS | Status: DC | PRN
  Administered 2021-08-19 (×4): 5 mg via INTRAVENOUS

## 2021-08-19 MED ORDER — PROPOFOL 10 MG/ML IV BOLUS
INTRAVENOUS | Status: DC | PRN
  Administered 2021-08-19: 50 mg via INTRAVENOUS
  Administered 2021-08-19: 150 mg via INTRAVENOUS
  Administered 2021-08-19: 80 mg via INTRAVENOUS

## 2021-08-19 MED ORDER — FENTANYL CITRATE (PF) 100 MCG/2ML IJ SOLN: 25.0000 ug | INTRAMUSCULAR | Status: DC | PRN

## 2021-08-19 MED ORDER — PROPOFOL 10 MG/ML IV BOLUS
INTRAVENOUS | Status: AC
  Filled 2021-08-19: qty 20

## 2021-08-19 MED ORDER — CHLORHEXIDINE GLUCONATE 0.12 % MT SOLN: 15.0000 mL | Freq: Once | OROMUCOSAL | Status: AC

## 2021-08-19 MED ORDER — ACETAMINOPHEN 500 MG PO TABS
1000.0000 mg | ORAL_TABLET | Freq: Once | ORAL | Status: AC
  Administered 2021-08-19: 08:00:00 1000 mg via ORAL
  Filled 2021-08-19: qty 2

## 2021-08-19 MED ORDER — FENTANYL CITRATE (PF) 100 MCG/2ML IJ SOLN
INTRAMUSCULAR | Status: AC
  Administered 2021-08-19: 09:00:00 50 ug via INTRAVENOUS
  Filled 2021-08-19: qty 2

## 2021-08-19 MED ORDER — MAGTRACE LYMPHATIC TRACER
INTRAMUSCULAR | Status: DC | PRN
  Administered 2021-08-19: 10:00:00 3 mL via INTRAMUSCULAR

## 2021-08-19 SURGICAL SUPPLY — 50 items
APL PRP STRL LF DISP 70% ISPRP (MISCELLANEOUS) ×2
APL SKNCLS STERI-STRIP NONHPOA (GAUZE/BANDAGES/DRESSINGS) ×2
APPLIER CLIP 9.375 MED OPEN (MISCELLANEOUS) ×3
APR CLP MED 9.3 20 MLT OPN (MISCELLANEOUS) ×2
BAG COUNTER SPONGE SURGICOUNT (BAG) ×3 IMPLANT
BAG SPNG CNTER NS LX DISP (BAG) ×2
BENZOIN TINCTURE PRP APPL 2/3 (GAUZE/BANDAGES/DRESSINGS) ×1 IMPLANT
BINDER BREAST LRG (GAUZE/BANDAGES/DRESSINGS) IMPLANT
BINDER BREAST XLRG (GAUZE/BANDAGES/DRESSINGS) IMPLANT
CANISTER SUCT 3000ML PPV (MISCELLANEOUS) ×3 IMPLANT
CHLORAPREP W/TINT 26 (MISCELLANEOUS) ×3 IMPLANT
CLIP APPLIE 9.375 MED OPEN (MISCELLANEOUS) ×2 IMPLANT
CNTNR URN SCR LID CUP LEK RST (MISCELLANEOUS) ×2 IMPLANT
CONT SPEC 4OZ STRL OR WHT (MISCELLANEOUS) ×3
COVER PROBE W GEL 5X96 (DRAPES) ×4 IMPLANT
COVER SURGICAL LIGHT HANDLE (MISCELLANEOUS) ×3 IMPLANT
DEVICE DUBIN SPECIMEN MAMMOGRA (MISCELLANEOUS) ×3 IMPLANT
DRAPE CHEST BREAST 15X10 FENES (DRAPES) ×3 IMPLANT
DRSG TEGADERM 4X4.75 (GAUZE/BANDAGES/DRESSINGS) ×6 IMPLANT
ELECT BLADE 6.5 EXT (BLADE) ×1 IMPLANT
ELECT CAUTERY BLADE 6.4 (BLADE) ×3 IMPLANT
ELECT REM PT RETURN 9FT ADLT (ELECTROSURGICAL) ×3
ELECTRODE REM PT RTRN 9FT ADLT (ELECTROSURGICAL) ×2 IMPLANT
GAUZE SPONGE 2X2 8PLY STRL LF (GAUZE/BANDAGES/DRESSINGS) ×2 IMPLANT
GLOVE SURG ENC MOIS LTX SZ7 (GLOVE) ×3 IMPLANT
GLOVE SURG UNDER POLY LF SZ7.5 (GLOVE) ×3 IMPLANT
GOWN STRL REUS W/ TWL LRG LVL3 (GOWN DISPOSABLE) ×4 IMPLANT
GOWN STRL REUS W/TWL LRG LVL3 (GOWN DISPOSABLE) ×6
KIT BASIN OR (CUSTOM PROCEDURE TRAY) ×3 IMPLANT
KIT MARKER MARGIN INK (KITS) ×1 IMPLANT
LIGHT WAVEGUIDE WIDE FLAT (MISCELLANEOUS) ×1 IMPLANT
NDL 18GX1X1/2 (RX/OR ONLY) (NEEDLE) ×2 IMPLANT
NDL FILTER BLUNT 18X1 1/2 (NEEDLE) ×2 IMPLANT
NDL HYPO 25GX1X1/2 BEV (NEEDLE) ×4 IMPLANT
NEEDLE 18GX1X1/2 (RX/OR ONLY) (NEEDLE) ×3 IMPLANT
NEEDLE FILTER BLUNT 18X 1/2SAF (NEEDLE) ×1
NEEDLE FILTER BLUNT 18X1 1/2 (NEEDLE) ×2 IMPLANT
NEEDLE HYPO 25GX1X1/2 BEV (NEEDLE) ×6 IMPLANT
NS IRRIG 1000ML POUR BTL (IV SOLUTION) ×3 IMPLANT
PACK GENERAL/GYN (CUSTOM PROCEDURE TRAY) ×3 IMPLANT
SPONGE GAUZE 2X2 STER 10/PKG (GAUZE/BANDAGES/DRESSINGS) ×2
SPONGE T-LAP 4X18 ~~LOC~~+RFID (SPONGE) ×3 IMPLANT
STRIP CLOSURE SKIN 1/2X4 (GAUZE/BANDAGES/DRESSINGS) ×3 IMPLANT
SUT MNCRL AB 4-0 PS2 18 (SUTURE) ×6 IMPLANT
SUT VIC AB 3-0 SH 27 (SUTURE) ×6
SUT VIC AB 3-0 SH 27X BRD (SUTURE) ×4 IMPLANT
SYR CONTROL 10ML LL (SYRINGE) ×6 IMPLANT
TOWEL GREEN STERILE (TOWEL DISPOSABLE) ×3 IMPLANT
TOWEL GREEN STERILE FF (TOWEL DISPOSABLE) ×3 IMPLANT
TRACER MAGTRACE VIAL (MISCELLANEOUS) ×1 IMPLANT

## 2021-08-19 NOTE — Op Note (Signed)
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 Anesthesia:  GEN - LMA/ PEC block Indications: This is a 60 year old female who was found to have a 7 mm area of architectural distortion in the right breast.  Biopsy revealed invasive ductal carcinoma grade 1, ER/PR positive, HER2 negative, Ki-67 1%.  She was found to have a single enlarged lymph node which was biopsied and was positive.  She underwent MRI which revealed a much larger area of cancer measuring 3.8 cm in diameter.  Further biopsies confirmed the presence of a wider area of invasive ductal carcinoma.  She presents now for bracketed lumpectomy and targeted lymph node dissection. Description of procedure: The patient is brought to the operating room placed in supine position on the operating room table. After an adequate level of general anesthesia was obtained, we injected Magtrace solution in the right upper outer quadrant around her nipple.  Her right breast and axilla were prepped with ChloraPrep and draped in sterile fashion. A timeout was taken to ensure the proper patient and proper procedure. We interrogated the breast with the neoprobe. We made a circumareolar incision around the upper side of the nipple after infiltrating with 0.25% Marcaine. Dissection was carried down in the breast tissue with cautery. We used the neoprobe to guide [place] towards the more anterior, inferior radioactive seed. We used this area as our inferior anterior margin.  We took a cylinder of tissue about 3 cm in diameter oriented in a superior fashion and excised both radioactive seeds.  The specimen was removed and was oriented with a paint kit. Specimen mammogram showed both radioactive seeds as well as the biopsy clips within the specimen. This was sent for  pathologic examination. There is no residual radioactivity within the biopsy cavity. Clips were placed in all five margins.  We inspected carefully for hemostasis. The wound was thoroughly irrigated.   We then turned our attention to the axilla. We interrogated the axilla and located the area of activity with the radioactive seed.  I made a transverse incision over the area of activity.  We dissected into the axilla and identified lymph node containing the radioactive seed.  We excised this lymph node as well as some surrounding lymph nodes.  Specimen mammogram showed the radioactive seed and biopsy clip.  I was able to palpate several other small lymph nodes in the area we excised several more.  These were sent separately as right axillary lymph nodes.  We inspected for hemostasis.  The wounds were closed with a deep layer of 3-0 Vicryl and a subcuticular layer of 4-0 Monocryl. Benzoin Steri-Strips were applied. The patient was then extubated and brought to the recovery room in stable condition. All sponge, instrument, and needle counts are correct.  Tonya Haynes. Georgette Dover, MD, [facility]  General/ Trauma Surgery  08/24/2020 1:38 PM

## 2021-08-19 NOTE — Transfer of Care (Signed)
Immediate Anesthesia Transfer of Care Note  Patient: Magazine features editor  Procedure(s) Performed: RIGHT BREAST LUMPECTOMY WITH RADIOACTIVE SEED X2 (Right: Breast) RADIOACTIVE SEED GUIDED RIGHT  AXILLARY SENTINEL LYMPH NODE DISSECTION (Right: Axilla) AXILLARY SENTINEL NODE BIOPSY (Right: Axilla)  Patient Location: PACU  Anesthesia Type:General and Regional  Level of Consciousness: awake, alert , oriented, patient cooperative and responds to stimulation  Airway & Oxygen Therapy: Patient Spontanous Breathing and Patient connected to nasal cannula oxygen  Post-op Assessment: Report given to RN and Post -op Vital signs reviewed and stable  Post vital signs: Reviewed and stable  Last Vitals:  Vitals Value Taken Time  BP 126/87 08/19/21 1119  Temp    Pulse 82 08/19/21 1121  Resp 14 08/19/21 1121  SpO2 94 % 08/19/21 1121  Vitals shown include unvalidated device data.  Last Pain:  Vitals:   08/19/21 0816  TempSrc:   PainSc: 0-No pain         Complications: No notable events documented.

## 2021-08-19 NOTE — Discharge Instructions (Signed)
[facility] Office Phone Number [phone]  BREAST BIOPSY/ PARTIAL MASTECTOMY: POST OP INSTRUCTIONS  Always review your discharge instruction sheet given to you by the facility where your surgery was performed.  IF YOU HAVE DISABILITY OR FAMILY LEAVE FORMS, YOU MUST BRING THEM TO THE OFFICE FOR PROCESSING.  DO NOT GIVE THEM TO YOUR DOCTOR.  A prescription for pain medication may be given to you upon discharge.  Take your pain medication as prescribed, if needed.  If narcotic pain medicine is not needed, then you may take acetaminophen (Tylenol) or ibuprofen (Advil) as needed. Take your usually prescribed medications unless otherwise directed If you need a refill on your pain medication, please contact your pharmacy.  They will contact our office to request authorization.  Prescriptions will not be filled after 5pm or on week-ends. You should eat very light the first 24 hours after surgery, such as soup, crackers, pudding, etc.  Resume your normal diet the day after surgery. Most patients will experience some swelling and bruising in the breast.  Ice packs and a good support bra will help.  Swelling and bruising can take several days to resolve.  It is common to experience some constipation if taking pain medication after surgery.  Increasing fluid intake and taking a stool softener will usually help or prevent this problem from occurring.  A mild laxative (Milk of Magnesia or Miralax) should be taken according to package directions if there are no bowel movements after 48 hours. Unless discharge instructions indicate otherwise, you may remove your bandages 24-48 hours after surgery, and you may shower at that time.  You may have steri-strips (small skin tapes) in place directly over the incision.  These strips should be left on the skin for 7-10 days.  If your surgeon used skin glue on the incision, you may shower in 24 hours.  The glue will flake off over the next 2-3 weeks.  Any  sutures or staples will be removed at the office during your follow-up visit. ACTIVITIES:  You may resume regular daily activities (gradually increasing) beginning the next day.  Wearing a good support bra or sports bra minimizes pain and swelling.  You may have sexual intercourse when it is comfortable. You may drive when you no longer are taking prescription pain medication, you can comfortably wear a seatbelt, and you can safely maneuver your car and apply brakes. RETURN TO WORK:  ______________________________________________________________________________________ You should see your doctor in the office for a follow-up appointment approximately two weeks after your surgery.  Your doctor's nurse will typically make your follow-up appointment when she calls you with your pathology report.  Expect your pathology report 2-3 business days after your surgery.  You may call to check if you do not hear from us after three days. OTHER INSTRUCTIONS: _______________________________________________________________________________________________ _____________________________________________________________________________________________________________________________________ _____________________________________________________________________________________________________________________________________ _____________________________________________________________________________________________________________________________________  WHEN TO CALL YOUR DOCTOR: Fever over 101.0 Nausea and/or vomiting. Extreme swelling or bruising. Continued bleeding from incision. Increased pain, redness, or drainage from the incision.  The clinic staff is available to answer your questions during regular business hours.  Please don't hesitate to call and ask to speak to one of the nurses for clinical concerns.  If you have a medical emergency, go to the nearest emergency room or call 911.  A surgeon from [facility] is always on call at the hospital.  For further questions, please visit [email or web address]   

## 2021-08-19 NOTE — Anesthesia Procedure Notes (Signed)
Procedure Name: LMA Insertion Date/Time: 08/19/2021 9:37 AM Performed by: Janene Harvey, CRNA Pre-anesthesia Checklist: Patient identified, Emergency Drugs available, Suction available and Patient being monitored Patient Re-evaluated:Patient Re-evaluated prior to induction Oxygen Delivery Method: Circle system utilized Preoxygenation: Pre-oxygenation with 100% oxygen Induction Type: IV induction LMA: LMA inserted LMA Size: 4.0 Placement Confirmation: positive ETCO2 Dental Injury: Teeth and Oropharynx as per pre-operative assessment

## 2021-08-19 NOTE — Anesthesia Procedure Notes (Signed)
Anesthesia Regional Block: Pectoralis block   Pre-Anesthetic Checklist: , timeout performed,  Correct Patient, Correct Site, Correct Laterality,  Correct Procedure, Correct Position, site marked,  Risks and benefits discussed,  Surgical consent,  Pre-op evaluation,  At surgeon's request and post-op pain management  Laterality: Right  Prep: Maximum Sterile Barrier Precautions used, chloraprep       Needles:  Injection technique: Single-shot  Needle Type: Echogenic Stimulator Needle     Needle Length: 9cm  Needle Gauge: 22     Additional Needles:   Procedures:,,,, ultrasound used (permanent image in chart),,    Narrative:  Start time: 08/19/2021 8:50 AM End time: 08/19/2021 8:57 AM Injection made incrementally with aspirations every 5 mL.  Performed by: Personally  Anesthesiologist: Freddrick March, MD  Additional Notes: Monitors applied. No increased pain on injection. No increased resistance to injection. Injection made in 5cc increments. Good needle visualization. Patient tolerated procedure well.

## 2021-08-19 NOTE — Interval H&P Note (Signed)
History and Physical Interval Note:  08/19/2021 7:36 AM  Tonya Haynes  has presented today for surgery, with the diagnosis of RIGHT BREAST INVASIVE DUCTAL CARCINOMA WITH AXILLARY METASTASES.  The various methods of treatment have been discussed with the patient and family. After consideration of risks, benefits and other options for treatment, the patient has consented to  Procedure(s) with comments: RIGHT BREAST LUMPECTOMY WITH RADIOACTIVE SEED X2 AND AXILLARY SENTINEL LYMPH NODE BIOPSY (Right) - 120 MINUTES ROOM 1 RADIOACTIVE SEED GUIDED RIGHT  AXILLARY SENTINEL LYMPH NODE DISSECTION (Right) as a surgical intervention.  The patient's history has been reviewed, patient examined, no change in status, stable for surgery.  I have reviewed the patient's chart and labs.  Questions were answered to the patient's satisfaction.     Maia Petties

## 2021-08-19 NOTE — Progress Notes (Signed)
Pacu Discharge Note  Patient instuctions were given to family. Wound care, diet, pain, follow up care and how and whom to contact with concerns were discussed. Family aware someone needs to remain with patient overnight and concerns after receiving anesthesia and what to avoid and safety. Answered all questions and concerns.   Discussed to call to verify follow up appointment.    Discharge paperwork has clear contact informations for surgeon and 24 hour RN line for concerns.   Discussed what concerns to look for including infection and signs/symptoms to look for.   IV was removed prior to discharge. Patient was brought to car with belongings.   Pt exits my care.

## 2021-08-20 ENCOUNTER — Encounter (HOSPITAL_COMMUNITY): Payer: Self-pay | Admitting: Surgery

## 2021-08-20 NOTE — Anesthesia Postprocedure Evaluation (Signed)
Anesthesia Post Note  Patient: Tonya Haynes  Procedure(s) Performed: RIGHT BREAST LUMPECTOMY WITH RADIOACTIVE SEED X2 (Right: Breast) RADIOACTIVE SEED GUIDED RIGHT  AXILLARY SENTINEL LYMPH NODE DISSECTION (Right: Axilla) AXILLARY SENTINEL NODE BIOPSY (Right: Axilla)     Patient location during evaluation: PACU Anesthesia Type: Regional and General Level of consciousness: awake and alert Pain management: pain level controlled Vital Signs Assessment: post-procedure vital signs reviewed and stable Respiratory status: spontaneous breathing, nonlabored ventilation, respiratory function stable and patient connected to nasal cannula oxygen Cardiovascular status: blood pressure returned to baseline and stable Postop Assessment: no apparent nausea or vomiting Anesthetic complications: no   No notable events documented.  Last Vitals:  Vitals:   08/19/21 1140 08/19/21 1210  BP:  120/76  Pulse: 80 89  Resp: 13 16  Temp:  36.8 C  SpO2: 92% 93%    Last Pain:  Vitals:   08/19/21 1210  TempSrc:   PainSc: 1    Pain Goal: Patients Stated Pain Goal: 3 (08/19/21 1210)                 Tonya Haynes

## 2021-08-24 ENCOUNTER — Ambulatory Visit: Payer: Self-pay | Admitting: Surgery

## 2021-08-24 LAB — SURGICAL PATHOLOGY

## 2021-08-25 ENCOUNTER — Encounter (HOSPITAL_COMMUNITY): Payer: Self-pay | Admitting: Surgery

## 2021-08-29 NOTE — Progress Notes (Signed)
Patient Care Team: Harlan Stains, MD as PCP - General (Family Medicine) Nahser, Wonda Cheng, MD as PCP - Cardiology (Cardiology) Rockwell Germany, RN as Oncology Nurse Navigator Mauro Kaufmann, RN as Oncology Nurse Navigator Donnie Mesa, MD as Consulting Physician (General Surgery) Nicholas Lose, MD as Consulting Physician (Hematology and Oncology) Eppie Gibson, MD as Attending Physician (Radiation Oncology)  DIAGNOSIS:    ICD-10-CM   1. Malignant neoplasm of upper-outer quadrant of right breast in female, estrogen receptor positive ([place])  C50.411    Z17.0       SUMMARY OF ONCOLOGIC HISTORY: Oncology History  Malignant neoplasm of upper-outer quadrant of right breast in female, estrogen receptor positive ([place])  07/13/2021 Initial Diagnosis   Screening mammogram: possible 0.6 cm distortion in the right breast. Diagnostic mammogram and [place]: 0.7 cm distortion in the right breast. Biopsy: grade 1/2 IDC with axillary lymph node (+) involved by metastatic carcinoma, ER+(95%) PR+(90%) Her2- Ki-67 1%   07/28/2021 Cancer Staging   Staging form: Breast, AJCC 8th Edition - Clinical stage from 07/28/2021: Stage IIA (cT2, cN1(f), cM0, G2, ER+, PR+, HER2-) - Signed by Nicholas Lose, MD on 08/16/2021 Stage prefix: Initial diagnosis Method of lymph node assessment: Fine needle aspiration Histologic grading system: 3 grade system     Genetic Testing   [facility] CancerNext (36 genes) was Negative. Report date is 08/05/2021.  The CancerNext gene panel offered by [employer] includes sequencing, rearrangement analysis, and RNA analysis for the following 36 genes:   APC, ATM, AXIN2, BARD1, BMPR1A, BRCA1, BRCA2, BRIP1, CDH1, CDK4, CDKN2A, CHEK2, DICER1, HOXB13, EPCAM, GREM1, MLH1, MSH2, MSH3, MSH6, MUTYH, NBN, NF1, NTHL1, PALB2, PMS2, POLD1, POLE, PTEN, RAD51C, RAD51D, RECQL, SMAD4, SMARCA4, STK11, and TP53.    08/19/2021 Surgery   Right lumpectomy: 5 cm (2 foci contiguous) grade 2 IDC and ILC with  DCIS, involves the inferior margin, ALH, 1/3 lymph nodes positive ER 70 to 95%, PR 50 to 90%, HER2 1+ by IHC, Ki-67 1%     CHIEF COMPLIANT: Follow-up of right breast cancer  INTERVAL HISTORY: Tonya Haynes is a 60 y.o. with above-mentioned history of right breast cancer. She presents to the clinic today for follow-up.   ALLERGIES:  has No Known Allergies.  MEDICATIONS:  Current Outpatient Medications  Medication Sig Dispense Refill   acetaminophen (TYLENOL) 500 MG tablet Take 1,000 mg by mouth every 6 (six) hours as needed for moderate pain.     fexofenadine (ALLEGRA) 180 MG tablet Take 180 mg by mouth daily as needed for allergies or rhinitis.     hydrochlorothiazide (HYDRODIURIL) 25 MG tablet Take 1 tablet (25 mg total) by mouth daily. 90 tablet 1   letrozole (FEMARA) 2.5 MG tablet Take 1 tablet (2.5 mg total) by mouth daily. (Patient taking differently: Take 2.5 mg by mouth every evening.) 90 tablet 3   lisinopril (ZESTRIL) 10 MG tablet Take 1 tablet (10 mg total) by mouth daily. 90 tablet 1   Multiple Vitamin (MULTI-VITAMIN PO) Take 1 capsule by mouth daily.      nebivolol (BYSTOLIC) 10 MG tablet Take 1 tablet (10 mg total) by mouth daily. 90 tablet 1   oxyCODONE (OXY IR/ROXICODONE) 5 MG immediate release tablet Take 1 tablet (5 mg total) by mouth every 6 (six) hours as needed for severe pain. 15 tablet 0   potassium chloride (KLOR-CON) 10 MEQ tablet Take 1 tablet (10 mEq total) by mouth daily. 90 tablet 1   rosuvastatin (CRESTOR) 10 MG tablet Take 1 tablet (10  mg total) by mouth daily. 90 tablet 1   No current facility-administered medications for this visit.    PHYSICAL EXAMINATION: ECOG PERFORMANCE STATUS: 1 - Symptomatic but completely ambulatory  Vitals:   08/30/21 1426  BP: (!) 179/93  Pulse: 80  Resp: 17  Temp: 98.1 F (36.7 C)  SpO2: 98%   Filed Weights   08/30/21 1426  Weight: 199 lb 6.4 oz (90.4 kg)    BREAST: No palpable masses or nodules in either  right or left breasts. No palpable axillary supraclavicular or infraclavicular adenopathy no breast tenderness or nipple discharge. (exam performed in the presence of a chaperone)  LABORATORY DATA:  I have reviewed the data as listed CMP Latest Ref Rng & Units 07/28/2021 10/11/2019 07/17/2019  Glucose 70 - 99 mg/dL 120(H) 102(H) 100(H)  BUN 6 - 20 mg/dL _0 Creatinine 0.44 - 1.00 mg/dL 1.09(H) 1.07(H) 1.02(H)  Sodium 135 - 145 mmol/L 136 138 140  Potassium 3.5 - 5.1 mmol/L 4.3 4.5 4.5  Chloride 98 - 111 mmol/L 100 100 103  CO2 22 - 32 mmol/L _1 Calcium 8.9 - 10.3 mg/dL 9.9 9.8 9.5  Total Protein 6.5 - 8.1 g/dL 7.7 - -  Total Bilirubin 0.3 - 1.2 mg/dL 0.9 - -  Alkaline Phos 38 - 126 U/L 82 - -  AST 15 - 41 U/L 23 - -  ALT 0 - 44 U/L 22 - -    Lab Results  Component Value Date   WBC 6.1 07/28/2021   HGB 13.7 07/28/2021   HCT 39.7 07/28/2021   MCV 86.7 07/28/2021   PLT 133 (L) 07/28/2021   NEUTROABS 4.2 07/28/2021    ASSESSMENT & PLAN:  Malignant neoplasm of upper-outer quadrant of right breast in female, estrogen receptor positive ([place]) 07/13/2021: Screening mammogram: possible 0.6 cm distortion in the right breast. Diagnostic mammogram and [place]: 0.7 cm distortion in the right breast. 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 08/19/2021:Right lumpectomy: 5 cm (2 foci contiguous) grade 2 IDC and ILC with DCIS, involves the inferior margin, ALH, 1/3 lymph nodes positive ER 70 to 95%, PR 50 to 90%, HER2 1+ by IHC, Ki-67 1%  Treatment plan: Adjuvant radiation therapy Followed by adjuvant antiestrogen therapy (she started letrozole prior to surgery)  We discussed significance of positive inferior margin.  She has an appointment for additional surgery on 09/09/2021 I will request radiation oncology consultation soon  after she can get started with radiation.  Discussed with the patient that since only 1 lymph node is involved and she has low risk MammaPrint, there is no role of chemotherapy.  With a Ki-67 of only 1% and the grade being a grade 2, there is no role of CDK 4 and 6 inhibitor therapy.  I will see her back after radiation. She took letrozole prior to surgery and has tolerated it extremely well.    No orders of the defined types were placed in this encounter.  The patient has a good understanding of the overall plan. she agrees with it. she will call with any problems that may develop before the next visit here.  Total time spent: 20 mins including face to face time and time spent for planning, charting and coordination of care  Rulon Eisenmenger, MD, MPH 08/30/2021  I, Thana Ates, am acting as scribe for Dr. Loleta Dicker  Helen Winterhalter.  I have reviewed the above documentation for accuracy and completeness, and I agree with the above.

## 2021-08-30 ENCOUNTER — Inpatient Hospital Stay: Payer: BC Managed Care – PPO | Admitting: Hematology and Oncology

## 2021-08-30 ENCOUNTER — Other Ambulatory Visit: Payer: Self-pay

## 2021-08-30 ENCOUNTER — Ambulatory Visit: Payer: BC Managed Care – PPO | Admitting: Hematology and Oncology

## 2021-08-30 ENCOUNTER — Other Ambulatory Visit: Payer: Self-pay | Admitting: *Deleted

## 2021-08-30 DIAGNOSIS — Z170 Estrogen receptor positive status [ER+]: Secondary | ICD-10-CM | POA: Diagnosis not present

## 2021-08-30 DIAGNOSIS — C50411 Malignant neoplasm of upper-outer quadrant of right female breast: Secondary | ICD-10-CM | POA: Diagnosis not present

## 2021-08-30 NOTE — Assessment & Plan Note (Addendum)
07/13/2021:Screening mammogram: possible 0.6 cm distortion in the right breast. Diagnostic mammogram and [place]: 0.7 cm distortion in the right breast. 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 08/19/2021:Right lumpectomy: 5 cm (2 foci contiguous) grade 2 IDC and ILC with DCIS, involves the inferior margin, ALH, 1/3 lymph nodes positive ER 70 to 95%, PR 50 to 90%, HER2 1+ by IHC, Ki-67 1%  Treatment plan: 1. Adjuvant radiation therapy 2. Followed by adjuvant antiestrogen therapy   We discussed significance of positive inferior margin.  Discussed with the patient that since only 1 lymph node is involved and she has low risk MammaPrint, there is no role of chemotherapy.  With a Ki-67 of only 1% and the grade being a grade 2, there is no role of CDK 4 and 6 inhibitor therapy.  I will see her back after radiation to start antiestrogens.

## 2021-08-31 ENCOUNTER — Encounter (HOSPITAL_BASED_OUTPATIENT_CLINIC_OR_DEPARTMENT_OTHER): Payer: Self-pay | Admitting: Surgery

## 2021-08-31 ENCOUNTER — Encounter: Payer: Self-pay | Admitting: *Deleted

## 2021-08-31 ENCOUNTER — Other Ambulatory Visit: Payer: Self-pay

## 2021-08-31 ENCOUNTER — Ambulatory Visit: Payer: BC Managed Care – PPO | Admitting: Hematology and Oncology

## 2021-08-31 NOTE — Progress Notes (Signed)
[facility] CSW Progress Notes  Social work Theatre manager contacted [employer] by phone as a follow up from call on 11/2. Patient expressed gratitude for openness and availability of care team, and that physically and emotionally she is feeling much better than at the time of previous call.   Intern provided empathic listening and normalized feelings re adjusting to major changes that come with diagnosis and unexpected interruptions to treatment. Intern also affirmed patient's ability for self-reflection and clear communication that are major strengths in this process.  Patient will contact CSW with any future questions or concerns.  Rosary Lively, Social Work Intern Supervised by Gwinda Maine, LCSW

## 2021-09-02 ENCOUNTER — Other Ambulatory Visit: Payer: Self-pay | Admitting: Radiation Oncology

## 2021-09-02 ENCOUNTER — Inpatient Hospital Stay
Admission: RE | Admit: 2021-09-02 | Discharge: 2021-09-02 | Disposition: A | Discharge status: Home / Self Care | Payer: Self-pay | Source: Ambulatory Visit | Attending: Radiation Oncology | Admitting: Radiation Oncology

## 2021-09-02 ENCOUNTER — Ambulatory Visit
Admission: RE | Admit: 2021-09-02 | Discharge: 2021-09-02 | Disposition: A | Discharge status: Home / Self Care | Payer: Self-pay | Source: Ambulatory Visit | Attending: Radiation Oncology | Admitting: Radiation Oncology

## 2021-09-02 DIAGNOSIS — Z170 Estrogen receptor positive status [ER+]: Secondary | ICD-10-CM

## 2021-09-02 DIAGNOSIS — C50411 Malignant neoplasm of upper-outer quadrant of right female breast: Secondary | ICD-10-CM

## 2021-09-03 ENCOUNTER — Encounter (HOSPITAL_BASED_OUTPATIENT_CLINIC_OR_DEPARTMENT_OTHER)
Admission: RE | Admit: 2021-09-03 | Discharge: 2021-09-03 | Disposition: A | Discharge status: Home / Self Care | Payer: BC Managed Care – PPO | Source: Ambulatory Visit | Attending: Surgery | Admitting: Surgery

## 2021-09-03 DIAGNOSIS — Z01812 Encounter for preprocedural laboratory examination: Secondary | ICD-10-CM | POA: Insufficient documentation

## 2021-09-03 LAB — BASIC METABOLIC PANEL
Anion gap: 9 (ref 5–15)
BUN: 21 mg/dL — ABNORMAL HIGH (ref 6–20)
CO2: 25 mmol/L (ref 22–32)
Calcium: 9.6 mg/dL (ref 8.9–10.3)
Chloride: 102 mmol/L (ref 98–111)
Creatinine, Ser: 1 mg/dL (ref 0.44–1.00)
GFR, Estimated: >60 mL/min (ref >60)
Glucose, Bld: 101 mg/dL — ABNORMAL HIGH (ref 70–99)
Potassium: 4.5 mmol/L (ref 3.5–5.1)
Sodium: 136 mmol/L (ref 135–145)

## 2021-09-03 NOTE — Progress Notes (Signed)

## 2021-09-08 ENCOUNTER — Encounter (HOSPITAL_BASED_OUTPATIENT_CLINIC_OR_DEPARTMENT_OTHER)
Admission: RE | Disposition: A | Discharge status: Home / Self Care | Payer: Self-pay | Source: Ambulatory Visit | Attending: Surgery

## 2021-09-08 ENCOUNTER — Ambulatory Visit (HOSPITAL_BASED_OUTPATIENT_CLINIC_OR_DEPARTMENT_OTHER): Payer: BC Managed Care – PPO | Admitting: Anesthesiology

## 2021-09-08 ENCOUNTER — Other Ambulatory Visit: Payer: Self-pay

## 2021-09-08 ENCOUNTER — Encounter (HOSPITAL_BASED_OUTPATIENT_CLINIC_OR_DEPARTMENT_OTHER): Payer: Self-pay | Admitting: Surgery

## 2021-09-08 ENCOUNTER — Ambulatory Visit (HOSPITAL_BASED_OUTPATIENT_CLINIC_OR_DEPARTMENT_OTHER)
Admission: RE | Admit: 2021-09-08 | Discharge: 2021-09-08 | Disposition: A | Discharge status: Home / Self Care | Payer: BC Managed Care – PPO | Source: Ambulatory Visit | Attending: Surgery | Admitting: Surgery

## 2021-09-08 DIAGNOSIS — Z170 Estrogen receptor positive status [ER+]: Secondary | ICD-10-CM | POA: Insufficient documentation

## 2021-09-08 DIAGNOSIS — C773 Secondary and unspecified malignant neoplasm of axilla and upper limb lymph nodes: Secondary | ICD-10-CM | POA: Insufficient documentation

## 2021-09-08 DIAGNOSIS — E669 Obesity, unspecified: Secondary | ICD-10-CM | POA: Diagnosis not present

## 2021-09-08 DIAGNOSIS — Z6829 Body mass index (BMI) 29.0-29.9, adult: Secondary | ICD-10-CM | POA: Insufficient documentation

## 2021-09-08 DIAGNOSIS — C50911 Malignant neoplasm of unspecified site of right female breast: Secondary | ICD-10-CM | POA: Diagnosis not present

## 2021-09-08 DIAGNOSIS — I10 Essential (primary) hypertension: Secondary | ICD-10-CM

## 2021-09-08 HISTORY — PX: RE-EXCISION OF BREAST LUMPECTOMY: SHX6048

## 2021-09-08 SURGERY — EXCISION, LESION, BREAST
Anesthesia: General | Site: Breast | Laterality: Right

## 2021-09-08 MED ORDER — FENTANYL CITRATE (PF) 100 MCG/2ML IJ SOLN: 25.0000 ug | INTRAMUSCULAR | Status: DC | PRN

## 2021-09-08 MED ORDER — ACETAMINOPHEN 500 MG PO TABS
1000.0000 mg | ORAL_TABLET | ORAL | Status: AC
  Administered 2021-09-08: 1000 mg via ORAL

## 2021-09-08 MED ORDER — LIDOCAINE 2% (20 MG/ML) 5 ML SYRINGE
INTRAMUSCULAR | Status: AC
  Filled 2021-09-08: qty 5

## 2021-09-08 MED ORDER — PROPOFOL 10 MG/ML IV BOLUS
INTRAVENOUS | Status: AC
  Filled 2021-09-08: qty 20

## 2021-09-08 MED ORDER — MIDAZOLAM HCL 5 MG/5ML IJ SOLN
INTRAMUSCULAR | Status: DC | PRN
  Administered 2021-09-08: 2 mg via INTRAVENOUS

## 2021-09-08 MED ORDER — CHLORHEXIDINE GLUCONATE CLOTH 2 % EX PADS: 6.0000 | MEDICATED_PAD | Freq: Once | CUTANEOUS | Status: DC

## 2021-09-08 MED ORDER — ACETAMINOPHEN 500 MG PO TABS
ORAL_TABLET | ORAL | Status: AC
  Filled 2021-09-08: qty 2

## 2021-09-08 MED ORDER — OXYCODONE HCL 5 MG/5ML PO SOLN: 5.0000 mg | Freq: Once | ORAL | Status: DC | PRN

## 2021-09-08 MED ORDER — ONDANSETRON HCL 4 MG/2ML IJ SOLN
INTRAMUSCULAR | Status: AC
  Filled 2021-09-08: qty 2

## 2021-09-08 MED ORDER — FENTANYL CITRATE (PF) 100 MCG/2ML IJ SOLN
INTRAMUSCULAR | Status: AC
  Filled 2021-09-08: qty 2

## 2021-09-08 MED ORDER — OXYCODONE HCL 5 MG PO TABS: 5.0000 mg | ORAL_TABLET | Freq: Once | ORAL | Status: DC | PRN

## 2021-09-08 MED ORDER — CEFAZOLIN SODIUM-DEXTROSE 2-4 GM/100ML-% IV SOLN
2.0000 g | INTRAVENOUS | Status: AC
  Administered 2021-09-08: 2 g via INTRAVENOUS

## 2021-09-08 MED ORDER — LIDOCAINE HCL (CARDIAC) PF 100 MG/5ML IV SOSY
PREFILLED_SYRINGE | INTRAVENOUS | Status: DC | PRN
  Administered 2021-09-08: 50 mg via INTRAVENOUS

## 2021-09-08 MED ORDER — DEXAMETHASONE SODIUM PHOSPHATE 10 MG/ML IJ SOLN
INTRAMUSCULAR | Status: AC
  Filled 2021-09-08: qty 1

## 2021-09-08 MED ORDER — EPHEDRINE 5 MG/ML INJ
INTRAVENOUS | Status: AC
  Filled 2021-09-08: qty 5

## 2021-09-08 MED ORDER — MIDAZOLAM HCL 2 MG/2ML IJ SOLN
INTRAMUSCULAR | Status: AC
  Filled 2021-09-08: qty 2

## 2021-09-08 MED ORDER — DEXAMETHASONE SODIUM PHOSPHATE 4 MG/ML IJ SOLN
INTRAMUSCULAR | Status: DC | PRN
  Administered 2021-09-08: 8 mg via INTRAVENOUS

## 2021-09-08 MED ORDER — EPHEDRINE SULFATE 50 MG/ML IJ SOLN
INTRAMUSCULAR | Status: DC | PRN
  Administered 2021-09-08: 10 mg via INTRAVENOUS

## 2021-09-08 MED ORDER — FENTANYL CITRATE (PF) 100 MCG/2ML IJ SOLN
INTRAMUSCULAR | Status: DC | PRN
  Administered 2021-09-08 (×2): 50 ug via INTRAVENOUS

## 2021-09-08 MED ORDER — ONDANSETRON HCL 4 MG/2ML IJ SOLN
INTRAMUSCULAR | Status: DC | PRN
  Administered 2021-09-08: 4 mg via INTRAVENOUS

## 2021-09-08 MED ORDER — KETOROLAC TROMETHAMINE 30 MG/ML IJ SOLN
INTRAMUSCULAR | Status: AC
  Filled 2021-09-08: qty 1

## 2021-09-08 MED ORDER — CEFAZOLIN SODIUM-DEXTROSE 2-4 GM/100ML-% IV SOLN
INTRAVENOUS | Status: AC
  Filled 2021-09-08: qty 100

## 2021-09-08 MED ORDER — KETOROLAC TROMETHAMINE 30 MG/ML IJ SOLN
INTRAMUSCULAR | Status: DC | PRN
  Administered 2021-09-08: 30 mg via INTRAVENOUS

## 2021-09-08 MED ORDER — PROPOFOL 10 MG/ML IV BOLUS
INTRAVENOUS | Status: DC | PRN
  Administered 2021-09-08: 200 mg via INTRAVENOUS

## 2021-09-08 MED ORDER — PROMETHAZINE HCL 25 MG/ML IJ SOLN: 6.2500 mg | INTRAMUSCULAR | Status: DC | PRN

## 2021-09-08 MED ORDER — LACTATED RINGERS IV SOLN: INTRAVENOUS | Status: DC

## 2021-09-08 MED ORDER — BUPIVACAINE-EPINEPHRINE 0.25% -1:200000 IJ SOLN
INTRAMUSCULAR | Status: DC | PRN
  Administered 2021-09-08: 10 mL

## 2021-09-08 SURGICAL SUPPLY — 42 items
APL PRP STRL LF DISP 70% ISPRP (MISCELLANEOUS) ×1
APL SKNCLS STERI-STRIP NONHPOA (GAUZE/BANDAGES/DRESSINGS) ×1
BENZOIN TINCTURE PRP APPL 2/3 (GAUZE/BANDAGES/DRESSINGS) ×2 IMPLANT
BLADE HEX COATED 2.75 (ELECTRODE) ×2 IMPLANT
BLADE SURG 15 STRL LF DISP TIS (BLADE) ×1 IMPLANT
BLADE SURG 15 STRL SS (BLADE) ×2
CANISTER SUCT 1200ML W/VALVE (MISCELLANEOUS) IMPLANT
CHLORAPREP W/TINT 26 (MISCELLANEOUS) ×2 IMPLANT
COVER BACK TABLE 60X90IN (DRAPES) ×2 IMPLANT
COVER MAYO STAND STRL (DRAPES) ×2 IMPLANT
DECANTER SPIKE VIAL GLASS SM (MISCELLANEOUS) IMPLANT
DRAPE LAPAROTOMY 100X72 PEDS (DRAPES) ×2 IMPLANT
DRAPE UTILITY XL STRL (DRAPES) ×2 IMPLANT
DRSG TEGADERM 4X4.75 (GAUZE/BANDAGES/DRESSINGS) ×2 IMPLANT
ELECT REM PT RETURN 9FT ADLT (ELECTROSURGICAL) ×2
ELECTRODE REM PT RTRN 9FT ADLT (ELECTROSURGICAL) ×1 IMPLANT
GAUZE SPONGE 4X4 12PLY STRL LF (GAUZE/BANDAGES/DRESSINGS) IMPLANT
GLOVE SURG ENC MOIS LTX SZ7 (GLOVE) ×2 IMPLANT
GLOVE SURG UNDER POLY LF SZ7.5 (GLOVE) ×2 IMPLANT
GOWN STRL REUS W/ TWL LRG LVL3 (GOWN DISPOSABLE) ×2 IMPLANT
GOWN STRL REUS W/TWL LRG LVL3 (GOWN DISPOSABLE) ×4
KIT MARKER MARGIN INK (KITS) IMPLANT
NDL HYPO 25X1 1.5 SAFETY (NEEDLE) ×1 IMPLANT
NEEDLE HYPO 25X1 1.5 SAFETY (NEEDLE) ×2 IMPLANT
NS IRRIG 1000ML POUR BTL (IV SOLUTION) ×2 IMPLANT
PACK BASIN DAY SURGERY FS (CUSTOM PROCEDURE TRAY) ×2 IMPLANT
PENCIL SMOKE EVACUATOR (MISCELLANEOUS) ×2 IMPLANT
SLEEVE SCD COMPRESS KNEE MED (STOCKING) ×2 IMPLANT
SPONGE GAUZE 2X2 8PLY STRL LF (GAUZE/BANDAGES/DRESSINGS) IMPLANT
SPONGE T-LAP 4X18 ~~LOC~~+RFID (SPONGE) ×2 IMPLANT
STRIP CLOSURE SKIN 1/2X4 (GAUZE/BANDAGES/DRESSINGS) ×2 IMPLANT
SUT CHROMIC 3 0 SH 27 (SUTURE) IMPLANT
SUT MON AB 4-0 PC3 18 (SUTURE) ×2 IMPLANT
SUT SILK 2 0 SH (SUTURE) IMPLANT
SUT VIC AB 3-0 SH 27 (SUTURE) ×2
SUT VIC AB 3-0 SH 27X BRD (SUTURE) ×1 IMPLANT
SYR BULB EAR ULCER 3OZ GRN STR (SYRINGE) ×2 IMPLANT
SYR CONTROL 10ML LL (SYRINGE) ×2 IMPLANT
TOWEL GREEN STERILE FF (TOWEL DISPOSABLE) ×2 IMPLANT
TRAY FAXITRON CT DISP (TRAY / TRAY PROCEDURE) IMPLANT
TUBE CONNECTING 20X1/4 (TUBING) ×2 IMPLANT
YANKAUER SUCT BULB TIP NO VENT (SUCTIONS) ×2 IMPLANT

## 2021-09-08 NOTE — Op Note (Signed)
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  Diagnostic mammogram and [place] were suspicious.  A single right axillary lymph node was thickened.  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  The axillary lymph node was suprisingly positive for metastatic disease.     On 08/19/21, she underwent right bracketed lumpectomy and targeted lymph node dissection.  Pathology showed a positive inferior margin.  1/3 lymph nodes were positive.  She presents now for reexcision of the inferior margin prior to radiation.  Description of procedure: The patient is brought to the operating room and placed in the supine position on the operating room table.  After an adequate level of general anesthesia was obtained, the patient's right breast was prepped with ChloraPrep and draped in sterile fashion.  A timeout was taken to ensure the proper patient and proper procedure.  We infiltrated the area around her previous circumareolar incision with 0.25% Marcaine with epinephrine.  I opened her old incision.  We dissected through the dermis until we entered the seroma cavity.  We evacuated the entire seroma and placed a retractor.  I reexcised a 1 cm thickness of the inferior margin.  This was oriented with the paint kit.  This was sent for pathologic examination.  Marking clips were placed for radiation therapy.  We inspected for hemostasis again.  The wound was closed with 3-0 Vicryl 4-0 Monocryl.  Benzoin and Steri-Strips were applied.  She was  extubated and brought to the recovery room in stable condition.  All sponge, instrument, and needle counts are correct.  Imogene Burn. Georgette Dover, MD, [facility] Surgery  General Surgery   09/08/2021 11:34 AM

## 2021-09-08 NOTE — Discharge Instructions (Addendum)
[facility] Office Phone Number [phone]  BREAST BIOPSY/ PARTIAL MASTECTOMY: POST OP INSTRUCTIONS  Always review your discharge instruction sheet given to you by the facility where your surgery was performed.  IF YOU HAVE DISABILITY OR FAMILY LEAVE FORMS, YOU MUST BRING THEM TO THE OFFICE FOR PROCESSING.  DO NOT GIVE THEM TO YOUR DOCTOR.  A prescription for pain medication may be given to you upon discharge.  Take your pain medication as prescribed, if needed.  If narcotic pain medicine is not needed, then you may take acetaminophen (Tylenol) or ibuprofen (Advil) as needed. Take your usually prescribed medications unless otherwise directed If you need a refill on your pain medication, please contact your pharmacy.  They will contact our office to request authorization.  Prescriptions will not be filled after 5pm or on week-ends. You should eat very light the first 24 hours after surgery, such as soup, crackers, pudding, etc.  Resume your normal diet the day after surgery. Most patients will experience some swelling and bruising in the breast.  Ice packs and a good support bra will help.  Swelling and bruising can take several days to resolve.  It is common to experience some constipation if taking pain medication after surgery.  Increasing fluid intake and taking a stool softener will usually help or prevent this problem from occurring.  A mild laxative (Milk of Magnesia or Miralax) should be taken according to package directions if there are no bowel movements after 48 hours. Unless discharge instructions indicate otherwise, you may remove your bandages 24-48 hours after surgery, and you may shower at that time.  You may have steri-strips (small skin tapes) in place directly over the incision.  These strips should be left on the skin for 7-10 days.  If your surgeon used skin glue on the incision, you may shower in 24 hours.  The glue will flake off over the next 2-3 weeks.  Any  sutures or staples will be removed at the office during your follow-up visit. ACTIVITIES:  You may resume regular daily activities (gradually increasing) beginning the next day.  Wearing a good support bra or sports bra minimizes pain and swelling.  You may have sexual intercourse when it is comfortable. You may drive when you no longer are taking prescription pain medication, you can comfortably wear a seatbelt, and you can safely maneuver your car and apply brakes. RETURN TO WORK:  ______________________________________________________________________________________ You should see your doctor in the office for a follow-up appointment approximately two weeks after your surgery.  Your doctor's nurse will typically make your follow-up appointment when she calls you with your pathology report.  Expect your pathology report 2-3 business days after your surgery.  You may call to check if you do not hear from us after three days. OTHER INSTRUCTIONS: _______________________________________________________________________________________________ _____________________________________________________________________________________________________________________________________ _____________________________________________________________________________________________________________________________________ _____________________________________________________________________________________________________________________________________  WHEN TO CALL YOUR DOCTOR: Fever over 101.0 Nausea and/or vomiting. Extreme swelling or bruising. Continued bleeding from incision. Increased pain, redness, or drainage from the incision.  The clinic staff is available to answer your questions during regular business hours.  Please don't hesitate to call and ask to speak to one of the nurses for clinical concerns.  If you have a medical emergency, go to the nearest emergency room or call 911.  A surgeon from [facility] is always on call at the hospital.  For further questions, please visit [email or web address]    Post Anesthesia Home Care Instructions  Activity: Get plenty of rest for the remainder of   the day. A responsible individual must stay with you for 24 hours following the procedure.  For the next 24 hours, DO NOT: -Drive a car -Operate machinery -Drink alcoholic beverages -Take any medication unless instructed by your physician -Make any legal decisions or sign important papers.  Meals: Start with liquid foods such as gelatin or soup. Progress to regular foods as tolerated. Avoid greasy, spicy, heavy foods. If nausea and/or vomiting occur, drink only clear liquids until the nausea and/or vomiting subsides. Call your physician if vomiting continues.  Special Instructions/Symptoms: Your throat may feel dry or sore from the anesthesia or the breathing tube placed in your throat during surgery. If this causes discomfort, gargle with warm salt water. The discomfort should disappear within 24 hours.  If you had a scopolamine patch placed behind your ear for the management of post- operative nausea and/or vomiting:  1. The medication in the patch is effective for 72 hours, after which it should be removed.  Wrap patch in a tissue and discard in the trash. Wash hands thoroughly with soap and water. 2. You may remove the patch earlier than 72 hours if you experience unpleasant side effects which may include dry mouth, dizziness or visual disturbances. 3. Avoid touching the patch. Wash your hands with soap and water after contact with the patch.      

## 2021-09-08 NOTE — Anesthesia Procedure Notes (Signed)
Procedure Name: LMA Insertion Date/Time: 09/08/2021 10:52 AM Performed by: Bufford Spikes, CRNA Pre-anesthesia Checklist: Patient identified, Emergency Drugs available, Suction available and Patient being monitored Patient Re-evaluated:Patient Re-evaluated prior to induction Oxygen Delivery Method: Circle system utilized Preoxygenation: Pre-oxygenation with 100% oxygen Induction Type: IV induction Ventilation: Mask ventilation without difficulty LMA: LMA inserted LMA Size: 4.0 Number of attempts: 1 Placement Confirmation: positive ETCO2 Tube secured with: Tape Dental Injury: Teeth and Oropharynx as per pre-operative assessment

## 2021-09-08 NOTE — Anesthesia Postprocedure Evaluation (Signed)
Anesthesia Post Note  Patient: Magazine features editor  Procedure(s) Performed: RE-EXCISION OF INFERIOR MARGIN RIGHT BREAST LUMPECTOMY (Right: Breast)     Patient location during evaluation: PACU Anesthesia Type: General Level of consciousness: awake and alert and oriented Pain management: pain level controlled Vital Signs Assessment: post-procedure vital signs reviewed and stable Respiratory status: spontaneous breathing, nonlabored ventilation and respiratory function stable Cardiovascular status: blood pressure returned to baseline and stable Postop Assessment: no apparent nausea or vomiting Anesthetic complications: no   No notable events documented.  Last Vitals:  Vitals:   09/08/21 1145 09/08/21 1200  BP: 125/80 124/83  Pulse: 81 77  Resp: 20 15  Temp:    SpO2: 97% 96%    Last Pain:  Vitals:   09/08/21 1213  TempSrc:   PainSc: 0-No pain                 Thiago Ragsdale A.

## 2021-09-08 NOTE — H&P (Signed)
Tonya Haynes is an 60 y.o. female.   Chief Complaint: Right breast cancer HPI: This is a 60 year old female who recently underwent screening mammogram on 06/18/21 that revealed 0.7 x 0.6 x 0.4 cm area of architectural distortion in the right breast 12:00 8 cmfn.  Diagnostic mammogram and [place] were suspicious.  A single right axillary lymph node was thickened.  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  The axillary lymph node was suprisingly positive for metastatic disease.    On 08/19/21, she underwent right bracketed lumpectomy and targeted lymph node dissection.  Pathology showed a positive inferior margin.  1/3 lymph nodes were positive.  She presents now for reexcision of the inferior margin prior to radiation.  Past Medical History:  Diagnosis Date   Anxiety    Asthma    Breast cancer ([place])    Diastolic dysfunction    History of basal cell carcinoma    Hypertension    LBBB (left bundle branch block)    Unspecified asthma(493.90)     Problem list entry automatically replaced. Please review for accuracy.    Past Surgical History:  Procedure Laterality Date   AXILLARY SENTINEL NODE BIOPSY Right 08/19/2021   Procedure: AXILLARY SENTINEL NODE BIOPSY;  Surgeon: Donnie Mesa, MD;  Location: [facility];  Service: General;  Laterality: Right;   BASAL CELL CARCINOMA EXCISION     RIGHT DELTOID MUSCLE   BREAST BIOPSY Right 08/11/2021   BREAST LUMPECTOMY WITH RADIOACTIVE SEED AND SENTINEL LYMPH NODE BIOPSY Right 08/19/2021   Procedure: RIGHT BREAST LUMPECTOMY WITH RADIOACTIVE SEED X2;  Surgeon: Donnie Mesa, MD;  Location: [facility];  Service: General;  Laterality: Right;  120 MINUTES ROOM 1   RADIOACTIVE SEED GUIDED AXILLARY SENTINEL LYMPH NODE Right 08/19/2021   Procedure: RADIOACTIVE SEED GUIDED RIGHT  AXILLARY SENTINEL LYMPH NODE DISSECTION;  Surgeon: Donnie Mesa, MD;  Location: [facility];  Service: General;   Laterality: Right;    Family History  Problem Relation Age of Onset   Breast cancer Mother 36   Fibromyalgia Mother    Hypertension Father    Lung cancer Maternal Uncle        smoked   Stomach cancer Paternal Uncle        dx. 3s   Social History:  reports that she has never smoked. She has never used smokeless tobacco. She reports current alcohol use. She reports that she does not use drugs.  Allergies:  Allergies  Allergen Reactions   Chlorhexidine Gluconate Rash    Rash with CHG soap and wipes    Medications Prior to Admission  Medication Sig Dispense Refill   acetaminophen (TYLENOL) 500 MG tablet Take 1,000 mg by mouth every 6 (six) hours as needed for moderate pain.     fexofenadine (ALLEGRA) 180 MG tablet Take 180 mg by mouth daily as needed for allergies or rhinitis.     hydrochlorothiazide (HYDRODIURIL) 25 MG tablet Take 1 tablet (25 mg total) by mouth daily. 90 tablet 1   letrozole (FEMARA) 2.5 MG tablet Take 1 tablet (2.5 mg total) by mouth daily. (Patient taking differently: Take 2.5 mg by mouth every evening.) 90 tablet 3   lisinopril (ZESTRIL) 10 MG tablet Take 1 tablet (10 mg total) by mouth daily. 90 tablet 1   Multiple Vitamin (MULTI-VITAMIN PO) Take 1 capsule by mouth daily.      nebivolol (BYSTOLIC) 10 MG tablet Take 1 tablet (10 mg  total) by mouth daily. 90 tablet 1   oxyCODONE (OXY IR/ROXICODONE) 5 MG immediate release tablet Take 1 tablet (5 mg total) by mouth every 6 (six) hours as needed for severe pain. 15 tablet 0   potassium chloride (KLOR-CON) 10 MEQ tablet Take 1 tablet (10 mEq total) by mouth daily. 90 tablet 1   rosuvastatin (CRESTOR) 10 MG tablet Take 1 tablet (10 mg total) by mouth daily. 90 tablet 1    No results found for this or any previous visit (from the past 48 hour(s)). No results found.  Review of Systems Constitutional: Negative.   HENT: Negative.   Eyes: Negative.   Respiratory: Negative.   Cardiovascular: Negative.    Gastrointestinal: Negative.   Genitourinary: Negative.   Musculoskeletal: Negative.   Skin: Negative.   Neurological: Negative.   Endo/Heme/Allergies: Negative.   Psychiatric/Behavioral: The patient is nervous/anxious.   Blood pressure 138/86, pulse 80, temperature 97.9 F (36.6 C), temperature source Oral, resp. rate 12, height 5\' 8"  (1.727 m), weight 89.4 kg, last menstrual period 04/23/2011, SpO2 (!) 9 %. Physical Exam  Constitutional:  WDWN in NAD, conversant, no obvious deformities; lying in bed comfortably Eyes:  Pupils equal, round; sclera anicteric; moist conjunctiva; no lid lag HENT:  Oral mucosa moist; good dentition  Neck:  No masses palpated, trachea midline; no thyromegaly Lungs:  CTA bilaterally; normal respiratory effort Breasts:  symmetric, no nipple changes; no lymphadenopathy on either side; she does have bilateral accessory breast tissue or lipomatous tissue in both axillae.  Healing right circumareolar incision.  No left breast masses CV:  Regular rate and rhythm; no murmurs; extremities well-perfused with no edema Abd:  +bowel sounds, soft, non-tender, no palpable organomegaly; no palpable hernias Musc:  normal gait; no apparent clubbing or cyanosis in extremities Lymphatic:  No palpable cervical or axillary lymphadenopathy Skin:  Warm, dry; no sign of jaundice Psychiatric - alert and oriented x 4; calm mood and affect Assessment/Plan Invasive ductal carcinoma right breast s/p lumpectomy with positive inferior margin  Reexcision of inferior margin right breast lumpectomy.  The surgical procedure has been discussed with the patient.  Potential risks, benefits, alternative treatments, and expected outcomes have been explained.  All of the patient's questions at this time have been answered.  The likelihood of reaching the patient's treatment goal is good.  The patient understand the proposed surgical procedure and wishes to proceed.   Maia Petties, MD 09/08/2021,  10:12 AM

## 2021-09-08 NOTE — Transfer of Care (Signed)
Immediate Anesthesia Transfer of Care Note  Patient: Tonya Haynes  Procedure(s) Performed: RE-EXCISION OF INFERIOR MARGIN RIGHT BREAST LUMPECTOMY (Right: Breast)  Patient Location: PACU  Anesthesia Type:General  Level of Consciousness: awake, alert  and oriented  Airway & Oxygen Therapy: Patient Spontanous Breathing and Patient connected to nasal cannula oxygen  Post-op Assessment: Report given to RN and Post -op Vital signs reviewed and stable  Post vital signs: Reviewed and stable  Last Vitals:  Vitals Value Taken Time  BP 102/67 09/08/21 1134  Temp 36.6 C 09/08/21 1134  Pulse 86 09/08/21 1141  Resp 19 09/08/21 1141  SpO2 98 % 09/08/21 1141  Vitals shown include unvalidated device data.  Last Pain:  Vitals:   09/08/21 0940  TempSrc: Oral  PainSc: 0-No pain      Patients Stated Pain Goal: 3 (37/94/32 7614)  Complications: No notable events documented.

## 2021-09-08 NOTE — Anesthesia Preprocedure Evaluation (Addendum)
Anesthesia Evaluation  Patient identified by MRN, date of birth, ID band Patient awake    Reviewed: Allergy & Precautions, NPO status , Patient's Chart, lab work & pertinent test results, reviewed documented beta blocker date and time   History of Anesthesia Complications Negative for: history of anesthetic complications  Airway Mallampati: I  TM Distance: >3 FB Neck ROM: Full    Dental  (+) Dental Advisory Given, Teeth Intact   Pulmonary asthma ,    Pulmonary exam normal        Cardiovascular hypertension, Pt. on home beta blockers and Pt. on medications Normal cardiovascular exam     Neuro/Psych PSYCHIATRIC DISORDERS Anxiety negative neurological ROS     GI/Hepatic negative GI ROS, Neg liver ROS,   Endo/Other   Obesity   Renal/GU negative Renal ROS     Musculoskeletal negative musculoskeletal ROS (+)   Abdominal   Peds  Hematology negative hematology ROS (+)   Anesthesia Other Findings   Reproductive/Obstetrics  Breast cancer                             Anesthesia Physical Anesthesia Plan  ASA: 2  Anesthesia Plan: General   Post-op Pain Management: Tylenol PO (pre-op)   Induction: Intravenous  PONV Risk Score and Plan: 3 and Treatment may vary due to age or medical condition, Ondansetron, Dexamethasone and Midazolam  Airway Management Planned: LMA  Additional Equipment: None  Intra-op Plan:   Post-operative Plan: Extubation in OR  Informed Consent: I have reviewed the patients History and Physical, chart, labs and discussed the procedure including the risks, benefits and alternatives for the proposed anesthesia with the patient or authorized representative who has indicated his/her understanding and acceptance.     Dental advisory given  Plan Discussed with: CRNA and Anesthesiologist  Anesthesia Plan Comments:        Anesthesia Quick Evaluation

## 2021-09-09 ENCOUNTER — Other Ambulatory Visit (HOSPITAL_COMMUNITY): Payer: BC Managed Care – PPO

## 2021-09-10 ENCOUNTER — Encounter (HOSPITAL_BASED_OUTPATIENT_CLINIC_OR_DEPARTMENT_OTHER): Payer: Self-pay | Admitting: Surgery

## 2021-09-10 LAB — SURGICAL PATHOLOGY

## 2021-09-15 ENCOUNTER — Encounter: Payer: Self-pay | Admitting: *Deleted

## 2021-09-16 ENCOUNTER — Ambulatory Visit: Payer: BC Managed Care – PPO | Attending: Surgery | Admitting: Physical Therapy

## 2021-09-16 ENCOUNTER — Encounter: Payer: Self-pay | Admitting: Physical Therapy

## 2021-09-16 ENCOUNTER — Other Ambulatory Visit: Payer: Self-pay

## 2021-09-16 DIAGNOSIS — R293 Abnormal posture: Secondary | ICD-10-CM | POA: Diagnosis present

## 2021-09-16 DIAGNOSIS — R600 Localized edema: Secondary | ICD-10-CM | POA: Insufficient documentation

## 2021-09-16 DIAGNOSIS — Z170 Estrogen receptor positive status [ER+]: Secondary | ICD-10-CM | POA: Diagnosis present

## 2021-09-16 DIAGNOSIS — Z483 Aftercare following surgery for neoplasm: Secondary | ICD-10-CM | POA: Diagnosis present

## 2021-09-16 DIAGNOSIS — C50411 Malignant neoplasm of upper-outer quadrant of right female breast: Secondary | ICD-10-CM | POA: Insufficient documentation

## 2021-09-16 NOTE — Therapy (Signed)
[facility] @ [place], [place], [address] Phone: [phone]   Fax:  [phone]  Physical Therapy Treatment  Patient Details  Name: Tonya Haynes MRN: [id number] Date of Birth: 10/13/1960 Referring Provider (PT): Dr. Donnie Mesa   Encounter Date: 09/16/2021   PT End of Session - 09/16/21 1315     Visit Number 2    Number of Visits 2    PT Start Time 1110    PT Stop Time 1233    PT Time Calculation (min) 83 min    Activity Tolerance Patient tolerated treatment well    Behavior During Therapy [facility] for tasks assessed/performed             Past Medical History:  Diagnosis Date   Anxiety    Asthma    Breast cancer ([place])    Diastolic dysfunction    History of basal cell carcinoma    Hypertension    LBBB (left bundle branch block)    Unspecified asthma(493.90)     Problem list entry automatically replaced. Please review for accuracy.    Past Surgical History:  Procedure Laterality Date   AXILLARY SENTINEL NODE BIOPSY Right 08/19/2021   Procedure: AXILLARY SENTINEL NODE BIOPSY;  Surgeon: Donnie Mesa, MD;  Location: [facility];  Service: General;  Laterality: Right;   BASAL CELL CARCINOMA EXCISION     RIGHT DELTOID MUSCLE   BREAST BIOPSY Right 08/11/2021   BREAST LUMPECTOMY WITH RADIOACTIVE SEED AND SENTINEL LYMPH NODE BIOPSY Right 08/19/2021   Procedure: RIGHT BREAST LUMPECTOMY WITH RADIOACTIVE SEED X2;  Surgeon: Donnie Mesa, MD;  Location: [facility];  Service: General;  Laterality: Right;  120 MINUTES ROOM 1   RADIOACTIVE SEED GUIDED AXILLARY SENTINEL LYMPH NODE Right 08/19/2021   Procedure: RADIOACTIVE SEED GUIDED RIGHT  AXILLARY SENTINEL LYMPH NODE DISSECTION;  Surgeon: Donnie Mesa, MD;  Location: [facility];  Service: General;  Laterality: Right;   RE-EXCISION OF BREAST LUMPECTOMY Right 09/08/2021   Procedure: RE-EXCISION OF INFERIOR MARGIN RIGHT BREAST LUMPECTOMY;  Surgeon: Donnie Mesa, MD;   Location: [facility];  Service: General;  Laterality: Right;    There were no vitals filed for this visit.   Subjective Assessment - 09/16/21 1113     Subjective Patient reports she underwent a right lumpectomy and sentinel node biopsy (1 of 3 nodes positive). She has a re-excision on 09/08/2021. Mammaprint test was low but she will undergo radiation treatment beginning 09/21/2021 followed by anti-estrogen therapy.    Pertinent History Patient was diagnosed on 06/18/2021 with right grade I invasive ductal carcinoma breast cancer. She underwent a right lumpectomy and sentinel node biopsy (1 of 3 nodes positive). It is ER/PR positive and HER2 negative with a Ki67 of 1%. She has a biopsied positive axillary lymph node.    Patient Stated Goals See how my arm is doing    Currently in Pain? Yes    Pain Score 2     Pain Location Breast    Pain Orientation Right    Pain Descriptors / Indicators Sharp    Pain Type Surgical pain    Pain Onset 1 to 4 weeks ago    Pain Frequency Intermittent    Aggravating Factors  Unknown    Pain Relieving Factors Unknown                [facility] PT Assessment - 09/16/21 0001       Assessment   Medical Diagnosis s/p right lumpectomy  and SLNB    Referring Provider (PT) Dr. Donnie Mesa    Onset Date/Surgical Date 08/19/21    Hand Dominance Right    Prior Therapy Baselines      Precautions   Precautions Other (comment)    Precaution Comments recent surgery and right arm lymphedema risk      Restrictions   Weight Bearing Restrictions No      Balance Screen   Has the patient fallen in the past 6 months No    Has the patient had a decrease in activity level because of a fear of falling?  No    Is the patient reluctant to leave their home because of a fear of falling?  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    Leisure She is walking 30 min each day      Cognition   Overall Cognitive Status Within Functional Limits for tasks assessed      Posture/Postural Control   Posture/Postural Control Postural limitations    Postural Limitations Rounded Shoulders;Forward head      ROM / Strength   AROM / PROM / Strength AROM      AROM   AROM Assessment Site Shoulder    Right/Left Shoulder Right    Right Shoulder Extension 51 Degrees    Right Shoulder Flexion 150 Degrees    Right Shoulder ABduction 162 Degrees    Right Shoulder Internal Rotation 83 Degrees    Right Shoulder External Rotation 90 Degrees      Strength   Overall Strength Within functional limits for tasks performed               LYMPHEDEMA/ONCOLOGY QUESTIONNAIRE - 09/16/21 0001       Type   Cancer Type Right breast cancer      Surgeries   Lumpectomy Date 08/19/21    Sentinel Lymph Node Biopsy Date 08/19/21    Number Lymph Nodes Removed 3      Treatment   Active Chemotherapy Treatment No    Past Chemotherapy Treatment No    Active Radiation Treatment No    Past Radiation Treatment No    Current Hormone Treatment No    Past Hormone Therapy No      What other symptoms do you have   Are you Having Heaviness or Tightness Yes    Are you having Pain Yes    Are you having pitting edema No    Is it Hard or Difficult finding clothes that fit No    Do you have infections No    Is there Decreased scar mobility Yes    Stemmer Sign No      Lymphedema Assessments   Lymphedema Assessments Upper extremities      Right Upper Extremity Lymphedema   10 cm Proximal to Olecranon Process 28.5 cm    Olecranon Process 24.9 cm    10 cm Proximal to Ulnar Styloid Process 22.8 cm    Just Proximal to Ulnar Styloid Process 15.4 cm    Across Hand at [employer] 20.2 cm    At [place] of 2nd  Digit 6 cm      Left Upper Extremity Lymphedema  10 cm Proximal to Olecranon Process 29.7 cm    Olecranon Process 26.7 cm    10 cm Proximal to Ulnar Styloid Process 22 cm    Just Proximal to Ulnar Styloid Process 15.2 cm    Across Hand at [employer] 20.4 cm    At [place] of 2nd Digit 5.9 cm                Quick Dash - 09/16/21 0001     Open a tight or new jar No difficulty    Do heavy household chores (wash walls, wash floors) Unable    Carry a shopping bag or briefcase No difficulty    Wash your back No difficulty    Use a knife to cut food No difficulty    Recreational activities in which you take some force or impact through your arm, shoulder, or hand (golf, hammering, tennis) Moderate difficulty    During the past week, to what extent has your arm, shoulder or hand problem interfered with your normal social activities with family, friends, neighbors, or groups? Slightly    During the past week, to what extent has your arm, shoulder or hand problem limited your work or other regular daily activities Slightly    Arm, shoulder, or hand pain. Mild    Tingling (pins and needles) in your arm, shoulder, or hand Moderate    Difficulty Sleeping No difficulty    DASH Score 25 %                             PT Education - 09/16/21 1314     Education Details Aftercare; scar massage; lymphedema risk reduction    Person(s) Educated Patient    Methods Explanation;Demonstration;Handout    Comprehension Returned demonstration;Verbalized understanding                 PT Long Term Goals - 09/16/21 1332       PT LONG TERM GOAL #1   Title Patient will demonstrate she has regained full shoulder ROM and function post operatively compared to baselines.    Time 8    Period Weeks    Status Achieved                   Plan - 09/16/21 1315     Clinical Impression Statement Patient is healing very well s/p right lumpectomy with sentinel node biopsy  (1/3 nodes positive) on 08/19/2021. She had a re-excision 09/08/2021 and margins were clear after that. She is scheduled for radiation simulation 09/21/2021. She had many questions regarding radiation, how to best monitor for breast cancer in the future (mammogram vs MRI), and why her initial diagnosis was a 6 mm mass but the MRI showed an 8.5 cm area of cancer. Patient was encouraged to talk with the radiologist about her mammogram findings. Radiation was explained related to side effects of fatigue and skin integrity changes that typically occur but she will get further explanation next week from Dr. Isidore Moos. Pt was encouraged to talk with her oncologist (Dr. Lindi Adie) about the best way for ongoing surveillance of her breast but that because her cancer was "sneaky", it may not be unreasonable to consider an MRI as Dr. Georgette Dover suggested. She has regained nearly full shoulder ROM (lacking ~10 degrees flexion and abduction), has regained UE function for tasks she has attempted, shows no signs of early lymphedema, and her incisions are healing very well. The  only area of swelling is near her right axilla which appears to be normal post surgical edema (Compression foam pad placed there and pt encouraged to get a Hugger bra as her is not providing compression). She has no PT needs at this time but will participate in the After Breast Cancer class on 10/18/2021.    PT Treatment/Interventions ADLs/Self Care Home Management;Therapeutic exercise;Patient/family education    PT Next Visit Plan D/C    PT Home Exercise Plan Post op HEP and closed chain flexion and abduction    Consulted and Agree with Plan of Care Patient             Patient will benefit from skilled therapeutic intervention in order to improve the following deficits and impairments:  Postural dysfunction, Decreased range of motion, Decreased knowledge of precautions, Impaired UE functional use, Pain  Visit Diagnosis: Malignant neoplasm of upper-outer  quadrant of right breast in female, estrogen receptor positive ([place])  Abnormal posture  Aftercare following surgery for neoplasm  Localized edema     Problem List Patient Active Problem List   Diagnosis Date Noted   Genetic testing 08/06/2021   Family history of breast cancer 07/29/2021   Malignant neoplasm of upper-outer quadrant of right breast in female, estrogen receptor positive ([place]) 07/23/2021   Essential hypertension, malignant 12/12/2016   Hypertension    LBBB (left bundle branch block)    Anxiety    Unspecified ZPHXTA(569.79)    Diastolic dysfunction    History of basal cell carcinoma    PHYSICAL THERAPY DISCHARGE SUMMARY  Visits from Start of Care: 2  Current functional level related to goals / functional outcomes: Goals met; see above for objective findings.   Remaining deficits: None   Education / Equipment: HEP and lymphedema education  Patient agrees to discharge. Patient goals were met. Patient is being discharged due to meeting the stated rehab goals.  Annia Friendly, [place] 09/16/21 1:48 PM    [facility] @ [place], [place], [address] Phone: [phone]   Fax:  [phone]  Name: Tonya Haynes MRN: [id number] Date of Birth: Jul 04, 1961

## 2021-09-16 NOTE — Patient Instructions (Addendum)
° ° °   [facility]  [address], [address]  [place] [address]  [phone]  After Breast Cancer Class It is recommended you attend the ABC class to be educated on lymphedema risk reduction. This class is free of charge and lasts for 1 hour. It is a 1-time class. You will need to download the Webex app either on your phone or computer. We will send you a link the night before or the morning of the class. You should be able to click on that link to join the class. This is not a confidential class. You don't have to turn your camera on, but other participants may be able to see your email address. You are scheduled for October 18, 2021 at 11.   Scar massage You can begin gentle scar massage to you incision sites. Gently place one hand on the incision and move the skin (without sliding on the skin) in various directions. Do this for a few minutes and then you can gently massage either coconut oil or vitamin E cream into the scars.  Compression garment You should continue wearing your compression bra until you feel like you no longer have swelling. You are going to [employer] [place] to get [employer]	[place] bra.  Home exercise Program Continue doing the exercises you were given until you feel like you can do them without feeling any tightness at the end.   Walking Program Studies show that 30 minutes of walking per day (fast enough to elevate your heart rate) can significantly reduce the risk of a cancer recurrence. If you can't walk due to other medical reasons, we encourage you to find another activity you could do (like a stationary bike or water exercise).  Posture After breast cancer surgery, people frequently sit with rounded shoulders posture because it puts their incisions on slack and feels better. If you sit like this and scar tissue forms in that position, you can become very tight and have pain sitting or standing with good posture. Try to be aware of your posture and  sit and stand up tall to heal properly.  Follow up PT: It is recommended you return every 3 months for the first 3 years following surgery to be assessed on the SOZO machine for an L-Dex score. This helps prevent clinically significant lymphedema in 95% of patients. These follow up screens are 10 minute appointments that you are not billed for. You are scheduled for November 22, 2021 at 10:10.  Closed Chain: Shoulder Abduction / Adduction - on Wall    One hand on wall, step to side and return. Stepping causes shoulder to abduct and adduct. Step _5__ times, holding 5 seconds, __2_ times per day.  http://ss.exer.us/267   Copyright  VHI. All rights reserved.  Closed Chain: Shoulder Flexion / Extension - on Wall    Hands on wall, step backward. Return. Stepping causes shoulder flexion and extension Do __5_ times, holding 5 seconds, _2__ times per day.  http://ss.exer.us/265   Copyright  VHI. All rights reserved.   [email or web address]  Armed forces technical officer at [employer] @ [address], [place],  [address] [phone]

## 2021-09-20 ENCOUNTER — Ambulatory Visit (HOSPITAL_COMMUNITY): Payer: BC Managed Care – PPO | Attending: Cardiovascular Disease

## 2021-09-20 ENCOUNTER — Telehealth: Payer: Self-pay

## 2021-09-20 ENCOUNTER — Other Ambulatory Visit: Payer: Self-pay

## 2021-09-20 DIAGNOSIS — I447 Left bundle-branch block, unspecified: Secondary | ICD-10-CM

## 2021-09-20 DIAGNOSIS — I340 Nonrheumatic mitral (valve) insufficiency: Secondary | ICD-10-CM | POA: Diagnosis not present

## 2021-09-20 LAB — ECHOCARDIOGRAM COMPLETE
Area-P 1/2: 4.21 cm2
S' Lateral: 3.1 cm

## 2021-09-20 NOTE — Progress Notes (Signed)
Location of Breast Cancer:  Malignant neoplasm of upper-outer quadrant of right breast in female, estrogen receptor positive  Histology per Pathology Report:  08/19/2021 FINAL MICROSCOPIC DIAGNOSIS:  A. BREAST, RIGHT, LUMPECTOMY:  -  Invasive ductal carcinoma, Nottingham grade 2 of 3  -  Ductal carcinoma, intermediate grade  -  Invasive ductal carcinoma involves the inferior margin  -  Previous biopsy site changes present  -  Lobular neoplasia (atypical lobular hyperplasia)  -  See oncology table and comment below  B.  LYMPH NODE, RIGHT AXILLARY EXCISION WITH TARGETED NODE, EXCISION:  -  Metastatic carcinoma involving one of two lymph nodes (1/2)  C. LYMPH NODE, RIGHT AXILLARY, SENTINEL, EXCISION:  -  No carcinoma identified in one lymph node (0/1)   Receptor Status: ER(70-95%), PR (50-90%), Her2-neu (Negative via IHC), Ki-67(1%)  Did patient present with symptoms (if so, please note symptoms) or was this found on screening mammography?: 07/13/2021 Screening mammogram: possible 0.6 cm distortion in the right breast. Diagnostic mammogram and [place]: 0.7 cm distortion in the right breast.  Past/Anticipated interventions by surgeon, if any:  09/08/2021 --Dr. Donnie Mesa Reexcision of inferior margin right breast lumpectomy  08/19/2021 --Dr. Donnie Mesa Right bracketed radioactive seed localized lumpectomy Targeted lymph node dissection/mag trace injection  Past/Anticipated interventions by medical oncology, if any:  Under care of Dr. Nicholas Lose 08/30/2021 --Treatment plan: Adjuvant radiation therapy Followed by adjuvant antiestrogen therapy (she started letrozole prior to surgery) We discussed significance of positive inferior margin.  She has an appointment for additional surgery on 09/09/2021 I will request radiation oncology consultation soon after she can get started with radiation. Discussed with the patient that since only 1 lymph node is involved and she has low risk  MammaPrint, there is no role of chemotherapy.  With a Ki-67 of only 1% and the grade being a grade 2, there is no role of CDK 4 and 6 inhibitor therapy. I will see her back after radiation. She took letrozole prior to surgery and has tolerated it extremely well.  Lymphedema issues, if any:  denies    Pain issues, if any:  denies   SAFETY ISSUES: Prior radiation? Radioactive seeds Pacemaker/ICD? no Possible current pregnancy? No--postmenopausal Is the patient on methotrexate? no  Current Complaints / other details:  none    Vitals:   09/21/21 0829  BP: (!) 128/92  Pulse: 96  Resp: 18  Temp: 97.6 F (36.4 C)  SpO2: 98%  Weight: 193 lb 3.2 oz (87.6 kg)  Height: '5\' 8"'  (1.727 m)

## 2021-09-20 NOTE — Telephone Encounter (Signed)
Nutrition Assessment   Reason for Assessment:   Referral from [place], [place]. Patient with question regarding soy and breast cancer   ASSESSMENT:  60 year old female with right breast cancer (ER+).  S/p right breast lumpectomy.  Past medical history of HTN.  Patient planning to start radiation.   Spoke with patient via phone.  Patient reports good appetite eating a overall healthy diet.  Questions regarding if eating soy foods are ok with breast cancer and nutrition concerns with upcoming radiation.    Medications: reviewed   Labs: reviewed   Anthropometrics:   Height: 68 inches Weight: 197 lb 1.5 oz BMI: 29    NUTRITION DIAGNOSIS: Food and nutrition related knowledge deficit related to recent breast cancer diagnosis as evidenced by wanting to know foods she can eat.    INTERVENTION:  Reviewed recent evidenced regarding breast cancer and soy foods.  Will mail Soy and Breast Cancer handout from [facility].   Encouraged patient to visit www.aicr.org for more information. Encouraged eating plant based diet. Contact information mailed Signed patient up to attend Jan nutrition class   Next Visit: no follow-up at this time RD available as needed  Zaidan Keeble B. Zenia Resides, [place], [place] Registered Dietitian [phone] (mobile)

## 2021-09-20 NOTE — Progress Notes (Signed)
Radiation Oncology         [phone] ________________________________  Name: Tonya Haynes MRN: [id number]  Date: 09/21/2021  DOB: 10-12-1960  Follow-Up Visit Note  Outpatient  CC: Harlan Stains, MD  Nicholas Lose, MD  Diagnosis:      ICD-10-CM   1. Malignant neoplasm of upper-outer quadrant of right breast in female, estrogen receptor positive ([place])  C50.411    Z17.0     2. History of basal cell carcinoma  Z85.828        Cancer Staging  Malignant neoplasm of upper-outer quadrant of right breast in female, estrogen receptor positive ([place]) Staging form: Breast, AJCC 8th Edition - Clinical stage from 07/28/2021: Stage IIA (cT2, cN1(f), cM0, G2, ER+, PR+, HER2-) - Signed by Nicholas Lose, MD on 08/16/2021 Stage prefix: Initial diagnosis Method of lymph node assessment: Fine needle aspiration Histologic grading system: 3 grade system - Pathologic: Stage IB (pT3, pN1, cM0, G2, ER+, PR+, HER2-) - Signed by Nicholas Lose, MD on 08/30/2021 Stage prefix: Initial diagnosis Multigene prognostic tests performed: MammaPrint Histologic grading system: 3 grade system   Stage IB (pT3, pN1, cM0) Right Breast UOQ, invasive ductal carcinoma with lobular neoplasia, 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     Since breast clinic consultation date of 07/28/21, she underwent genetic testing on 07/28/21 which revealed no clinically significant variants detected on the BRCAplus panel.   Pertinent imaging since the patient was last seen includes:  --bilateral breast MRI on 08/02/21 which showed the biopsy proven invasive carcinoma in the upper right  breast, middle depth, 12 o'clock region, to measure 3.8 cm in the AP dimension, and between 1.7 and 2.9 cm in the craniocaudal dimension. An additional highly suspicious contiguous  linear non-mass enhancement was also seen, extending from the mass in the upper right breast (middle depth) to the immediate subareolar right breast (anterior depth), overall measuring 8 cm in extent from the posterior to anterior aspect, and noted as highly suspicious for additional extent of disease. Lastly, a single enlarged /morphologically abnormal lymph node in the right axilla was appreciated, corresponding to patient's known biopsy-proven lymph node metastasis. No evidence of malignancy was seen within the left breast.   Breast or nodal biopsies, since consultation, involved (dates and results as follows):  --Biopsy of upper central right breast on 08/11/21 revealed grade 1-2 invasive mammary carcinoma measuring 0.6 cm in the greatest linear extent. Right breast retroareolar biopsy revealed no malignancy identified.  ER: 70% positive with strong staining intensity; PR: 50% positive with moderate staining intensity; Her2 status negative; proliferation marker Ki67 at 1%; Grade 1-2.  --Mammaprint testing performed on the core biopsy sample revealed the patient as low risk.  The patient took letrozole prior to surgery and tolerated it extremely well (Dr. Lindi Adie).   The patient opted to proceed with right breast lumpectomy on the date of 08/19/21 under the care of Dr. Georgette Dover. 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  Re-excision of the right inferior margin performed on 09/08/21 revealed invasive ductal carcinoma measuring 1.5 cm. With invasive carcinoma less than 0.1 cm from the final inferior margin.   Of note: the patient  most recently followed up with Dr. Lindi Adie on 08/30/21. During which time, Dr. Lindi Adie informed the patient that she does not require chemotherapy given her low risk Mammaprint results and only 1 positive lymph node. The patient with follow up with Dr. Lindi Adie following RT to discuss antiestrogen therapy.   Symptomatically, the patient reports:   Lymphedema issues, if any:  mild swelling of lateral right chest postoperatively. Recently, she saw PT  Pain issues, if any:  denies   SAFETY ISSUES: Prior radiation? Radioactive seeds Pacemaker/ICD? no Possible current pregnancy? No--postmenopausal Is the patient on methotrexate? no  Current Complaints / other details:  none        ALLERGIES:  is allergic to chlorhexidine gluconate.  Meds: Current Outpatient Medications  Medication Sig Dispense Refill   acetaminophen (TYLENOL) 500 MG tablet Take 1,000 mg by mouth every 6 (six) hours as needed for moderate pain.     fexofenadine (ALLEGRA) 180 MG tablet Take 180 mg by mouth daily as needed for allergies or rhinitis.     hydrochlorothiazide (HYDRODIURIL) 25 MG tablet Take 1 tablet (25 mg total) by mouth daily. 90 tablet 1   letrozole (FEMARA) 2.5 MG tablet Take 1 tablet (2.5 mg total) by mouth daily. (Patient taking differently: Take 2.5 mg by mouth every evening.) 90 tablet 3   lisinopril (ZESTRIL) 10 MG tablet Take 1 tablet (10 mg total) by mouth daily. 90 tablet 1   Multiple Vitamin (MULTI-VITAMIN PO) Take 1 capsule by mouth daily.      nebivolol (BYSTOLIC) 10 MG tablet Take 1 tablet (10 mg total) by mouth daily. 90 tablet 1   potassium chloride (KLOR-CON) 10 MEQ tablet Take 1 tablet (10 mEq total) by mouth daily. 90 tablet 1   rosuvastatin (CRESTOR) 10 MG  tablet Take 1 tablet (10 mg total) by mouth daily. 90 tablet 1   No current facility-administered medications for this encounter.    Physical Findings:  height is _0  (1.727 m) and weight is 193 lb 3.2 oz (87.6 kg). Her temperature is 97.6 F (36.4 C). Her blood pressure is 128/92 (abnormal) and her pulse is 96. Her respiration is 18 and oxygen saturation is 98%. .     General: Alert and oriented, in no acute distress Musculoskeletal: good ROM in right shoulder Psychiatric: Judgment and insight are intact. Affect is appropriate. Breast exam reveals post operative bruising in right breast, no significant edema/swelling, no ooze from surgical sites  Lab Findings: Lab Results  Component Value Date   WBC 6.1 07/28/2021   HGB 13.7 07/28/2021   HCT 39.7 07/28/2021   MCV 86.7 07/28/2021   PLT 133 (L) 07/28/2021    _1 @  Radiographic Findings: ECHOCARDIOGRAM COMPLETE  Result Date: 09/20/2021    ECHOCARDIOGRAM REPORT   Patient Name:   [name] Date of Exam: 09/20/2021 Medical Rec #:  [id number]           Height:       68.0 in Accession #:    [id number]          Weight:       197.1 lb Date of Birth:  [birth year]  BSA:          2.031 m Patient Age:    14 years            BP:           179/93 mmHg Patient Gender: F                   HR:           84 bpm. Exam Location:  [address] Procedure: 2D Echo, 3D Echo, Cardiac Doppler, Color Doppler and Strain Analysis Indications:    I44.7 LBBB  History:        Patient has prior history of Echocardiogram examinations, most                 recent 07/09/2015. Arrythmias:LBBB; Risk Factors:Hypertension.                 Breast cancer.  Sonographer:    Basilia Jumbo BS, RDCS Referring Phys: [place]  1. Left ventricular ejection fraction, by estimation, is 55 to 60%. Left ventricular ejection fraction by 3D volume is 56 %. The left ventricle has normal function. The left ventricle has no regional wall motion  abnormalities. There is moderate concentric left ventricular hypertrophy of the basal-septal segment. Left ventricular diastolic parameters are consistent with Grade I diastolic dysfunction (impaired relaxation). The average left ventricular global longitudinal strain is -20.3 %. The global longitudinal strain is normal.  2. Right ventricular systolic function is normal. The right ventricular size is normal.  3. The mitral valve is normal in structure. Mild mitral valve regurgitation.  4. The aortic valve is tricuspid. Aortic valve regurgitation is trivial.  5. Aortic dilatation noted. There is mild dilatation of the ascending aorta, measuring 37 mm.  6. The inferior vena cava is normal in size with <50% respiratory variability, suggesting right atrial pressure of 8 mmHg. Comparison(s): Compared to prior TTE on 07/09/15, the LVEF has improved to 55-60%; previously EF 50-55%. FINDINGS  Left Ventricle: Left ventricular ejection fraction, by estimation, is 55 to 60%. Left ventricular ejection fraction by 3D volume is 56 %. The left ventricle has normal function. The left ventricle has no regional wall motion abnormalities. The average left ventricular global longitudinal strain is -20.3 %. The global longitudinal strain is normal. The left ventricular internal cavity size was normal in size. There is moderate concentric left ventricular hypertrophy of the basal-septal segment. Abnormal (paradoxical) septal motion, consistent with left bundle branch block. Left ventricular diastolic parameters are consistent with Grade I diastolic dysfunction (impaired relaxation). Right Ventricle: The right ventricular size is normal. No increase in right ventricular wall thickness. Right ventricular systolic function is normal. Left Atrium: Left atrial size was normal in size. Right Atrium: Right atrial size was normal in size. Pericardium: There is no evidence of pericardial effusion. Mitral Valve: The mitral valve is normal in  structure. Mild mitral valve regurgitation. Tricuspid Valve: The tricuspid valve is normal in structure. Tricuspid valve regurgitation is trivial. Aortic Valve: The aortic valve is tricuspid. Aortic valve regurgitation is trivial. Pulmonic Valve: The pulmonic valve was normal in structure. Pulmonic valve regurgitation is trivial. Aorta: Aortic dilatation noted. There is mild dilatation of the ascending aorta, measuring 37 mm. Venous: The inferior vena cava is normal in size with less than 50% respiratory variability, suggesting right atrial pressure of 8 mmHg. IAS/Shunts: The atrial septum is grossly normal.  LEFT VENTRICLE PLAX 2D LVIDd:  4.40 cm         Diastology LVIDs:         3.10 cm         LV e' medial:    7.18 cm/s LV PW:         1.00 cm         LV E/e' medial:  11.4 LV IVS:        1.10 cm         LV e' lateral:   8.92 cm/s LVOT diam:     2.40 cm         LV E/e' lateral: 9.2 LV SV:         101 LV SV Index:   50              2D LVOT Area:     4.52 cm        Longitudinal                                Strain                                2D Strain GLS  -21.0 %                                (A2C):                                2D Strain GLS  -17.8 %                                (A3C):                                2D Strain GLS  -22.1 %                                (A4C):                                2D Strain GLS  -20.3 %                                Avg:                                 3D Volume EF                                LV 3D EF:    Left                                             ventricul  ar                                             ejection                                             fraction                                             by 3D                                             volume is                                             56 %.                                 3D Volume EF:                                3D EF:         56 %                                LV EDV:       148 ml                                LV ESV:       66 ml                                LV SV:        83 ml RIGHT VENTRICLE             IVC RV Basal diam:  3.40 cm     IVC diam: 1.80 cm RV S prime:     14.60 cm/s TAPSE (M-mode): 2.2 cm LEFT ATRIUM             Index        RIGHT ATRIUM           Index LA diam:        4.00 cm 1.97 cm/m   RA Pressure: 3.00 mmHg LA Vol (A2C):   53.9 ml 26.54 ml/m  RA Area:     16.00 cm LA Vol (A4C):   44.7 ml 22.01 ml/m  RA Volume:   44.50 ml  21.91 ml/m LA Biplane Vol: 53.5 ml 26.34 ml/m  AORTIC VALVE LVOT Vmax:   106.00 cm/s LVOT Vmean:  72.300 cm/s LVOT VTI:    0.223 m  AORTA Ao Root diam: 3.40 cm Ao  Asc diam:  3.70 cm MITRAL VALVE                TRICUSPID VALVE                             Estimated RAP:  3.00 mmHg MV Decel Time: 180 msec MV E velocity: 81.80 cm/s   SHUNTS MV A velocity: 103.00 cm/s  Systemic VTI:  0.22 m MV E/A ratio:  0.79         Systemic Diam: 2.40 cm Gwyndolyn Kaufman MD Electronically signed by Gwyndolyn Kaufman MD Signature Date/Time: 09/20/2021/11:15:40 AM    Final     Impression/Plan: This is a delightful 60 yo woman with right breast cancer  I personally spoke with Dr. Georgette Dover today about her care. She is released for RT planning and he is confident about her negative margins.  We discussed adjuvant radiotherapy today.  I recommend radiation therapy to the right breast and regional nodes in order to reduce risk of locoregional recurrence by 2/3.  I reviewed the logistics, benefits, risks, and potential side effects of this treatment in detail. Risks may include but not necessary be limited to acute and late injury tissue in the radiation fields such as skin irritation (change in color/pigmentation, itching, dryness, pain, peeling). She may experience fatigue. We also discussed possible risk of long term cosmetic changes or scar tissue. There is also a smaller risk for lung toxicity, brachial  plexopathy, lymphedema, musculoskeletal changes, rib fragility or induction of a second malignancy, late chronic non-healing soft tissue wound.    The patient asked good questions which I answered to her satisfaction. She is enthusiastic about proceeding with treatment. A consent form has been  signed and placed in her chart. CT simulation today, starting RT on 10/06/21. 6 weeks of treatment recommended. She is pleased with this plan and eager to start.  On date of service, in total, I spent 30 minutes on this encounter. Patient was seen in person.  _____________________________________   Eppie Gibson, MD  This document serves as a record of services personally performed by Eppie Gibson, MD. It was created on her behalf by Roney Mans, a trained medical scribe. The creation of this record is based on the scribe's personal observations and the provider's statements to them. This document has been checked and approved by the attending provider.

## 2021-09-21 ENCOUNTER — Ambulatory Visit
Admission: RE | Admit: 2021-09-21 | Discharge: 2021-09-21 | Disposition: A | Discharge status: Home / Self Care | Payer: BC Managed Care – PPO | Source: Ambulatory Visit | Attending: Radiation Oncology | Admitting: Radiation Oncology

## 2021-09-21 ENCOUNTER — Encounter: Payer: Self-pay | Admitting: Radiation Oncology

## 2021-09-21 VITALS — BP 128/92 | HR 96 | Temp 97.6°F | Resp 18 | Ht 68.0 in | Wt 193.2 lb

## 2021-09-21 DIAGNOSIS — Z170 Estrogen receptor positive status [ER+]: Secondary | ICD-10-CM

## 2021-09-21 DIAGNOSIS — Z79899 Other long term (current) drug therapy: Secondary | ICD-10-CM | POA: Diagnosis not present

## 2021-09-21 DIAGNOSIS — Z85828 Personal history of other malignant neoplasm of skin: Secondary | ICD-10-CM

## 2021-09-21 DIAGNOSIS — C50411 Malignant neoplasm of upper-outer quadrant of right female breast: Secondary | ICD-10-CM | POA: Diagnosis present

## 2021-09-21 NOTE — Progress Notes (Signed)
See MD note for nursing evaluation. °

## 2021-09-22 ENCOUNTER — Other Ambulatory Visit: Payer: Self-pay

## 2021-09-22 ENCOUNTER — Other Ambulatory Visit: Payer: Self-pay | Admitting: *Deleted

## 2021-09-22 DIAGNOSIS — I5189 Other ill-defined heart diseases: Secondary | ICD-10-CM

## 2021-09-22 DIAGNOSIS — I10 Essential (primary) hypertension: Secondary | ICD-10-CM

## 2021-09-22 MED ORDER — NEBIVOLOL HCL 10 MG PO TABS: 10.0000 mg | ORAL_TABLET | Freq: Every day | ORAL | 2 refills | Status: DC

## 2021-09-22 MED ORDER — HYDROCHLOROTHIAZIDE 25 MG PO TABS: 25.0000 mg | ORAL_TABLET | Freq: Every day | ORAL | 1 refills | Status: DC

## 2021-09-22 MED ORDER — LISINOPRIL 10 MG PO TABS: 10.0000 mg | ORAL_TABLET | Freq: Every day | ORAL | 1 refills | Status: DC

## 2021-09-22 MED ORDER — POTASSIUM CHLORIDE ER 10 MEQ PO TBCR: 10.0000 meq | EXTENDED_RELEASE_TABLET | Freq: Every day | ORAL | 1 refills | Status: DC

## 2021-09-24 DIAGNOSIS — C50411 Malignant neoplasm of upper-outer quadrant of right female breast: Secondary | ICD-10-CM | POA: Diagnosis not present

## 2021-09-30 ENCOUNTER — Other Ambulatory Visit: Payer: Self-pay

## 2021-09-30 ENCOUNTER — Other Ambulatory Visit: Payer: BC Managed Care – PPO | Admitting: *Deleted

## 2021-09-30 DIAGNOSIS — E782 Mixed hyperlipidemia: Secondary | ICD-10-CM

## 2021-09-30 DIAGNOSIS — Z79899 Other long term (current) drug therapy: Secondary | ICD-10-CM

## 2021-09-30 DIAGNOSIS — E7800 Pure hypercholesterolemia, unspecified: Secondary | ICD-10-CM

## 2021-09-30 DIAGNOSIS — E785 Hyperlipidemia, unspecified: Secondary | ICD-10-CM

## 2021-09-30 LAB — ALT: ALT: 30 IU/L (ref 0–32)

## 2021-09-30 LAB — LIPID PANEL
Chol/HDL Ratio: 2.5 ratio (ref 0.0–4.4)
Cholesterol, Total: 140 mg/dL (ref 100–199)
HDL: 55 mg/dL (ref >39)
LDL Chol Calc (NIH): 65 mg/dL (ref 0–99)
Triglycerides: 114 mg/dL (ref 0–149)
VLDL Cholesterol Cal: 20 mg/dL (ref 5–40)

## 2021-10-05 ENCOUNTER — Telehealth: Payer: Self-pay | Admitting: *Deleted

## 2021-10-05 DIAGNOSIS — E785 Hyperlipidemia, unspecified: Secondary | ICD-10-CM

## 2021-10-05 NOTE — Telephone Encounter (Signed)
-----   Message from Thayer Headings, MD sent at 10/02/2021  8:01 AM EST ----- Please set up an appt for [facility] with me in 3 months - follow up HTN and HLD ,LBBB Please get a lipid profile and ALT the day prior Thanks  [place]

## 2021-10-05 NOTE — Telephone Encounter (Signed)
Spoke with pt and scheduled her to see Dr. Acie Fredrickson on Wednesday, 01/05/22.  She can't come to the office on Tuesdays, so scheduled labs for that Monday, 01/03/22.  Pt appreciative for call.

## 2021-10-06 ENCOUNTER — Other Ambulatory Visit: Payer: BC Managed Care – PPO

## 2021-10-06 ENCOUNTER — Encounter: Payer: Self-pay | Admitting: *Deleted

## 2021-10-06 ENCOUNTER — Ambulatory Visit
Admission: RE | Admit: 2021-10-06 | Discharge: 2021-10-06 | Disposition: A | Discharge status: Home / Self Care | Payer: BC Managed Care – PPO | Source: Ambulatory Visit | Attending: Radiation Oncology | Admitting: Radiation Oncology

## 2021-10-06 ENCOUNTER — Other Ambulatory Visit: Payer: Self-pay

## 2021-10-06 DIAGNOSIS — C50411 Malignant neoplasm of upper-outer quadrant of right female breast: Secondary | ICD-10-CM | POA: Insufficient documentation

## 2021-10-06 DIAGNOSIS — Z170 Estrogen receptor positive status [ER+]: Secondary | ICD-10-CM | POA: Diagnosis present

## 2021-10-07 ENCOUNTER — Telehealth: Payer: Self-pay | Admitting: Hematology and Oncology

## 2021-10-07 ENCOUNTER — Ambulatory Visit
Admission: RE | Admit: 2021-10-07 | Discharge: 2021-10-07 | Disposition: A | Discharge status: Home / Self Care | Payer: BC Managed Care – PPO | Source: Ambulatory Visit | Attending: Radiation Oncology | Admitting: Radiation Oncology

## 2021-10-07 DIAGNOSIS — C50411 Malignant neoplasm of upper-outer quadrant of right female breast: Secondary | ICD-10-CM | POA: Diagnosis not present

## 2021-10-07 NOTE — Telephone Encounter (Signed)
Scheduled per 01/04 scheduled message, patient has been called and notified.

## 2021-10-08 ENCOUNTER — Ambulatory Visit
Admission: RE | Admit: 2021-10-08 | Discharge: 2021-10-08 | Disposition: A | Discharge status: Home / Self Care | Payer: BC Managed Care – PPO | Source: Ambulatory Visit | Attending: Radiation Oncology | Admitting: Radiation Oncology

## 2021-10-08 ENCOUNTER — Other Ambulatory Visit: Payer: Self-pay

## 2021-10-08 DIAGNOSIS — C50411 Malignant neoplasm of upper-outer quadrant of right female breast: Secondary | ICD-10-CM | POA: Diagnosis not present

## 2021-10-11 ENCOUNTER — Ambulatory Visit
Admission: RE | Admit: 2021-10-11 | Discharge: 2021-10-11 | Disposition: A | Discharge status: Home / Self Care | Payer: BC Managed Care – PPO | Source: Ambulatory Visit | Attending: Radiation Oncology | Admitting: Radiation Oncology

## 2021-10-11 ENCOUNTER — Other Ambulatory Visit: Payer: Self-pay

## 2021-10-11 DIAGNOSIS — Z170 Estrogen receptor positive status [ER+]: Secondary | ICD-10-CM

## 2021-10-11 DIAGNOSIS — C50411 Malignant neoplasm of upper-outer quadrant of right female breast: Secondary | ICD-10-CM | POA: Diagnosis not present

## 2021-10-11 MED ORDER — ALRA NON-METALLIC DEODORANT (RAD-ONC)
1.0000 "application " | Freq: Once | TOPICAL | Status: AC
  Administered 2021-10-11: 1 via TOPICAL

## 2021-10-11 MED ORDER — RADIAPLEXRX EX GEL: Freq: Once | CUTANEOUS | Status: AC

## 2021-10-11 NOTE — Progress Notes (Signed)

## 2021-10-12 ENCOUNTER — Ambulatory Visit
Admission: RE | Admit: 2021-10-12 | Discharge: 2021-10-12 | Disposition: A | Discharge status: Home / Self Care | Payer: BC Managed Care – PPO | Source: Ambulatory Visit | Attending: Radiation Oncology | Admitting: Radiation Oncology

## 2021-10-12 DIAGNOSIS — C50411 Malignant neoplasm of upper-outer quadrant of right female breast: Secondary | ICD-10-CM | POA: Diagnosis not present

## 2021-10-13 ENCOUNTER — Other Ambulatory Visit: Payer: Self-pay

## 2021-10-13 ENCOUNTER — Ambulatory Visit
Admission: RE | Admit: 2021-10-13 | Discharge: 2021-10-13 | Disposition: A | Discharge status: Home / Self Care | Payer: BC Managed Care – PPO | Source: Ambulatory Visit | Attending: Radiation Oncology | Admitting: Radiation Oncology

## 2021-10-13 DIAGNOSIS — C50411 Malignant neoplasm of upper-outer quadrant of right female breast: Secondary | ICD-10-CM | POA: Diagnosis not present

## 2021-10-14 ENCOUNTER — Ambulatory Visit
Admission: RE | Admit: 2021-10-14 | Discharge: 2021-10-14 | Disposition: A | Discharge status: Home / Self Care | Payer: BC Managed Care – PPO | Source: Ambulatory Visit | Attending: Radiation Oncology | Admitting: Radiation Oncology

## 2021-10-14 DIAGNOSIS — C50411 Malignant neoplasm of upper-outer quadrant of right female breast: Secondary | ICD-10-CM | POA: Diagnosis not present

## 2021-10-15 ENCOUNTER — Other Ambulatory Visit: Payer: Self-pay

## 2021-10-15 ENCOUNTER — Ambulatory Visit
Admission: RE | Admit: 2021-10-15 | Discharge: 2021-10-15 | Disposition: A | Discharge status: Home / Self Care | Payer: BC Managed Care – PPO | Source: Ambulatory Visit | Attending: Radiation Oncology | Admitting: Radiation Oncology

## 2021-10-18 ENCOUNTER — Other Ambulatory Visit: Payer: Self-pay

## 2021-10-18 ENCOUNTER — Ambulatory Visit
Admission: RE | Admit: 2021-10-18 | Discharge: 2021-10-18 | Disposition: A | Discharge status: Home / Self Care | Payer: BC Managed Care – PPO | Source: Ambulatory Visit | Attending: Radiation Oncology | Admitting: Radiation Oncology

## 2021-10-18 DIAGNOSIS — C50411 Malignant neoplasm of upper-outer quadrant of right female breast: Secondary | ICD-10-CM | POA: Diagnosis not present

## 2021-10-19 ENCOUNTER — Ambulatory Visit
Admission: RE | Admit: 2021-10-19 | Discharge: 2021-10-19 | Disposition: A | Discharge status: Home / Self Care | Payer: BC Managed Care – PPO | Source: Ambulatory Visit | Attending: Radiation Oncology | Admitting: Radiation Oncology

## 2021-10-19 ENCOUNTER — Other Ambulatory Visit: Payer: Self-pay | Admitting: Radiation Oncology

## 2021-10-19 ENCOUNTER — Encounter: Payer: Self-pay | Admitting: Physical Therapy

## 2021-10-19 DIAGNOSIS — C50411 Malignant neoplasm of upper-outer quadrant of right female breast: Secondary | ICD-10-CM | POA: Diagnosis not present

## 2021-10-20 ENCOUNTER — Other Ambulatory Visit: Payer: Self-pay

## 2021-10-20 ENCOUNTER — Ambulatory Visit
Admission: RE | Admit: 2021-10-20 | Discharge: 2021-10-20 | Disposition: A | Discharge status: Home / Self Care | Payer: BC Managed Care – PPO | Source: Ambulatory Visit | Attending: Radiation Oncology | Admitting: Radiation Oncology

## 2021-10-20 DIAGNOSIS — C50411 Malignant neoplasm of upper-outer quadrant of right female breast: Secondary | ICD-10-CM | POA: Diagnosis not present

## 2021-10-21 ENCOUNTER — Ambulatory Visit
Admission: RE | Admit: 2021-10-21 | Discharge: 2021-10-21 | Disposition: A | Discharge status: Home / Self Care | Payer: BC Managed Care – PPO | Source: Ambulatory Visit | Attending: Radiation Oncology | Admitting: Radiation Oncology

## 2021-10-21 DIAGNOSIS — C50411 Malignant neoplasm of upper-outer quadrant of right female breast: Secondary | ICD-10-CM | POA: Diagnosis not present

## 2021-10-22 ENCOUNTER — Ambulatory Visit
Admission: RE | Admit: 2021-10-22 | Discharge: 2021-10-22 | Disposition: A | Discharge status: Home / Self Care | Payer: BC Managed Care – PPO | Source: Ambulatory Visit | Attending: Radiation Oncology | Admitting: Radiation Oncology

## 2021-10-22 ENCOUNTER — Other Ambulatory Visit: Payer: Self-pay

## 2021-10-25 ENCOUNTER — Ambulatory Visit
Admission: RE | Admit: 2021-10-25 | Discharge: 2021-10-25 | Disposition: A | Discharge status: Home / Self Care | Payer: BC Managed Care – PPO | Source: Ambulatory Visit | Attending: Radiation Oncology | Admitting: Radiation Oncology

## 2021-10-25 ENCOUNTER — Other Ambulatory Visit: Payer: Self-pay

## 2021-10-25 DIAGNOSIS — C50411 Malignant neoplasm of upper-outer quadrant of right female breast: Secondary | ICD-10-CM

## 2021-10-25 DIAGNOSIS — Z170 Estrogen receptor positive status [ER+]: Secondary | ICD-10-CM

## 2021-10-25 MED ORDER — RADIAPLEXRX EX GEL: Freq: Once | CUTANEOUS | Status: AC

## 2021-10-26 ENCOUNTER — Ambulatory Visit
Admission: RE | Admit: 2021-10-26 | Discharge: 2021-10-26 | Disposition: A | Discharge status: Home / Self Care | Payer: BC Managed Care – PPO | Source: Ambulatory Visit | Attending: Radiation Oncology | Admitting: Radiation Oncology

## 2021-10-26 DIAGNOSIS — C50411 Malignant neoplasm of upper-outer quadrant of right female breast: Secondary | ICD-10-CM | POA: Diagnosis not present

## 2021-10-27 ENCOUNTER — Ambulatory Visit
Admission: RE | Admit: 2021-10-27 | Discharge: 2021-10-27 | Disposition: A | Discharge status: Home / Self Care | Payer: BC Managed Care – PPO | Source: Ambulatory Visit | Attending: Radiation Oncology | Admitting: Radiation Oncology

## 2021-10-27 ENCOUNTER — Other Ambulatory Visit: Payer: Self-pay

## 2021-10-27 DIAGNOSIS — C50411 Malignant neoplasm of upper-outer quadrant of right female breast: Secondary | ICD-10-CM | POA: Diagnosis not present

## 2021-10-28 ENCOUNTER — Ambulatory Visit
Admission: RE | Admit: 2021-10-28 | Discharge: 2021-10-28 | Disposition: A | Discharge status: Home / Self Care | Payer: BC Managed Care – PPO | Source: Ambulatory Visit | Attending: Radiation Oncology | Admitting: Radiation Oncology

## 2021-10-28 ENCOUNTER — Inpatient Hospital Stay: Payer: BC Managed Care – PPO | Attending: Hematology and Oncology | Admitting: Dietician

## 2021-10-28 DIAGNOSIS — C50411 Malignant neoplasm of upper-outer quadrant of right female breast: Secondary | ICD-10-CM | POA: Diagnosis not present

## 2021-10-29 ENCOUNTER — Ambulatory Visit
Admission: RE | Admit: 2021-10-29 | Discharge: 2021-10-29 | Disposition: A | Discharge status: Home / Self Care | Payer: BC Managed Care – PPO | Source: Ambulatory Visit | Attending: Radiation Oncology | Admitting: Radiation Oncology

## 2021-10-29 ENCOUNTER — Other Ambulatory Visit: Payer: Self-pay

## 2021-10-29 DIAGNOSIS — C50411 Malignant neoplasm of upper-outer quadrant of right female breast: Secondary | ICD-10-CM | POA: Diagnosis not present

## 2021-10-29 NOTE — Progress Notes (Signed)
Patient attended Nutrition 101 Class on 10/28/2021.  ° °Suzanne Searra Carnathan, RD °Clinical Dietitian °[facility] ° °

## 2021-11-01 ENCOUNTER — Other Ambulatory Visit: Payer: Self-pay

## 2021-11-01 ENCOUNTER — Ambulatory Visit
Admission: RE | Admit: 2021-11-01 | Discharge: 2021-11-01 | Disposition: A | Discharge status: Home / Self Care | Payer: BC Managed Care – PPO | Source: Ambulatory Visit | Attending: Radiation Oncology | Admitting: Radiation Oncology

## 2021-11-01 DIAGNOSIS — C50411 Malignant neoplasm of upper-outer quadrant of right female breast: Secondary | ICD-10-CM | POA: Diagnosis not present

## 2021-11-02 ENCOUNTER — Ambulatory Visit
Admission: RE | Admit: 2021-11-02 | Discharge: 2021-11-02 | Disposition: A | Discharge status: Home / Self Care | Payer: BC Managed Care – PPO | Source: Ambulatory Visit | Attending: Radiation Oncology | Admitting: Radiation Oncology

## 2021-11-02 DIAGNOSIS — C50411 Malignant neoplasm of upper-outer quadrant of right female breast: Secondary | ICD-10-CM | POA: Diagnosis not present

## 2021-11-03 ENCOUNTER — Ambulatory Visit
Admission: RE | Admit: 2021-11-03 | Discharge: 2021-11-03 | Disposition: A | Discharge status: Home / Self Care | Payer: BC Managed Care – PPO | Source: Ambulatory Visit | Attending: Radiation Oncology | Admitting: Radiation Oncology

## 2021-11-03 ENCOUNTER — Other Ambulatory Visit: Payer: Self-pay

## 2021-11-03 DIAGNOSIS — Z170 Estrogen receptor positive status [ER+]: Secondary | ICD-10-CM | POA: Insufficient documentation

## 2021-11-03 DIAGNOSIS — C50411 Malignant neoplasm of upper-outer quadrant of right female breast: Secondary | ICD-10-CM | POA: Diagnosis not present

## 2021-11-04 ENCOUNTER — Ambulatory Visit
Admission: RE | Admit: 2021-11-04 | Discharge: 2021-11-04 | Disposition: A | Discharge status: Home / Self Care | Payer: BC Managed Care – PPO | Source: Ambulatory Visit | Attending: Radiation Oncology | Admitting: Radiation Oncology

## 2021-11-04 DIAGNOSIS — C50411 Malignant neoplasm of upper-outer quadrant of right female breast: Secondary | ICD-10-CM | POA: Diagnosis not present

## 2021-11-05 ENCOUNTER — Ambulatory Visit
Admission: RE | Admit: 2021-11-05 | Discharge: 2021-11-05 | Disposition: A | Discharge status: Home / Self Care | Payer: BC Managed Care – PPO | Source: Ambulatory Visit | Attending: Radiation Oncology | Admitting: Radiation Oncology

## 2021-11-05 ENCOUNTER — Other Ambulatory Visit: Payer: Self-pay

## 2021-11-05 DIAGNOSIS — C50411 Malignant neoplasm of upper-outer quadrant of right female breast: Secondary | ICD-10-CM | POA: Diagnosis not present

## 2021-11-08 ENCOUNTER — Ambulatory Visit
Admission: RE | Admit: 2021-11-08 | Discharge: 2021-11-08 | Disposition: A | Discharge status: Home / Self Care | Payer: BC Managed Care – PPO | Source: Ambulatory Visit | Attending: Radiation Oncology | Admitting: Radiation Oncology

## 2021-11-08 ENCOUNTER — Other Ambulatory Visit: Payer: Self-pay

## 2021-11-08 ENCOUNTER — Ambulatory Visit: Payer: BC Managed Care – PPO | Admitting: Radiation Oncology

## 2021-11-08 DIAGNOSIS — C50411 Malignant neoplasm of upper-outer quadrant of right female breast: Secondary | ICD-10-CM | POA: Diagnosis not present

## 2021-11-09 ENCOUNTER — Ambulatory Visit
Admission: RE | Admit: 2021-11-09 | Discharge: 2021-11-09 | Disposition: A | Discharge status: Home / Self Care | Payer: BC Managed Care – PPO | Source: Ambulatory Visit | Attending: Radiation Oncology | Admitting: Radiation Oncology

## 2021-11-09 DIAGNOSIS — C50411 Malignant neoplasm of upper-outer quadrant of right female breast: Secondary | ICD-10-CM | POA: Diagnosis not present

## 2021-11-10 ENCOUNTER — Ambulatory Visit
Admission: RE | Admit: 2021-11-10 | Discharge: 2021-11-10 | Disposition: A | Discharge status: Home / Self Care | Payer: BC Managed Care – PPO | Source: Ambulatory Visit | Attending: Radiation Oncology | Admitting: Radiation Oncology

## 2021-11-10 ENCOUNTER — Other Ambulatory Visit: Payer: Self-pay

## 2021-11-10 DIAGNOSIS — C50411 Malignant neoplasm of upper-outer quadrant of right female breast: Secondary | ICD-10-CM | POA: Diagnosis not present

## 2021-11-11 ENCOUNTER — Ambulatory Visit
Admission: RE | Admit: 2021-11-11 | Discharge: 2021-11-11 | Disposition: A | Discharge status: Home / Self Care | Payer: BC Managed Care – PPO | Source: Ambulatory Visit | Attending: Radiation Oncology | Admitting: Radiation Oncology

## 2021-11-11 DIAGNOSIS — C50411 Malignant neoplasm of upper-outer quadrant of right female breast: Secondary | ICD-10-CM | POA: Diagnosis not present

## 2021-11-12 ENCOUNTER — Other Ambulatory Visit: Payer: Self-pay

## 2021-11-12 ENCOUNTER — Ambulatory Visit
Admission: RE | Admit: 2021-11-12 | Discharge: 2021-11-12 | Disposition: A | Discharge status: Home / Self Care | Payer: BC Managed Care – PPO | Source: Ambulatory Visit | Attending: Radiation Oncology | Admitting: Radiation Oncology

## 2021-11-12 DIAGNOSIS — C50411 Malignant neoplasm of upper-outer quadrant of right female breast: Secondary | ICD-10-CM | POA: Diagnosis not present

## 2021-11-15 ENCOUNTER — Encounter: Payer: Self-pay | Admitting: *Deleted

## 2021-11-15 ENCOUNTER — Ambulatory Visit
Admission: RE | Admit: 2021-11-15 | Discharge: 2021-11-15 | Disposition: A | Discharge status: Home / Self Care | Payer: BC Managed Care – PPO | Source: Ambulatory Visit | Attending: Radiation Oncology | Admitting: Radiation Oncology

## 2021-11-15 ENCOUNTER — Other Ambulatory Visit: Payer: Self-pay

## 2021-11-15 DIAGNOSIS — Z170 Estrogen receptor positive status [ER+]: Secondary | ICD-10-CM

## 2021-11-15 DIAGNOSIS — C50411 Malignant neoplasm of upper-outer quadrant of right female breast: Secondary | ICD-10-CM | POA: Diagnosis not present

## 2021-11-16 ENCOUNTER — Ambulatory Visit
Admission: RE | Admit: 2021-11-16 | Discharge: 2021-11-16 | Disposition: A | Discharge status: Home / Self Care | Payer: BC Managed Care – PPO | Source: Ambulatory Visit | Attending: Radiation Oncology | Admitting: Radiation Oncology

## 2021-11-16 ENCOUNTER — Encounter: Payer: Self-pay | Admitting: Radiation Oncology

## 2021-11-16 DIAGNOSIS — C50411 Malignant neoplasm of upper-outer quadrant of right female breast: Secondary | ICD-10-CM | POA: Diagnosis not present

## 2021-11-16 NOTE — Assessment & Plan Note (Signed)
07/13/2021:Screening mammogram: possible 0.6 cm distortion in the right breast. Diagnostic mammogram and [place]: 0.7 cm distortion in the right breast. 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 08/19/2021:Right lumpectomy: 5 cm (2 foci contiguous) grade 2 IDC and ILC with DCIS, involves the inferior margin, ALH, 1/3 lymph nodes positive ER 70 to 95%, PR 50 to 90%, HER2 1+ by IHC, Ki-67 1%  Treatment plan: 1. Adjuvant radiation therapy 2. Followed by adjuvant antiestrogen therapy (she started letrozole prior to surgery)  She took letrozole prior to surgery and has tolerated it extremely well.

## 2021-11-16 NOTE — Progress Notes (Signed)
Patient Care Team: Harlan Stains, MD as PCP - General (Family Medicine) Nahser, Wonda Cheng, MD as PCP - Cardiology (Cardiology) Rockwell Germany, RN as Oncology Nurse Navigator Mauro Kaufmann, RN as Oncology Nurse Navigator Donnie Mesa, MD as Consulting Physician (General Surgery) Nicholas Lose, MD as Consulting Physician (Hematology and Oncology) Eppie Gibson, MD as Attending Physician (Radiation Oncology)  DIAGNOSIS:    ICD-10-CM   1. Malignant neoplasm of upper-outer quadrant of right breast in female, estrogen receptor positive ([place])  C50.411    Z17.0       SUMMARY OF ONCOLOGIC HISTORY: Oncology History  Malignant neoplasm of upper-outer quadrant of right breast in female, estrogen receptor positive ([place])  07/13/2021 Initial Diagnosis   Screening mammogram: possible 0.6 cm distortion in the right breast. Diagnostic mammogram and [place]: 0.7 cm distortion in the right breast. Biopsy: grade 1/2 IDC with axillary lymph node (+) involved by metastatic carcinoma, ER+(95%) PR+(90%) Her2- Ki-67 1%   07/28/2021 Cancer Staging   Staging form: Breast, AJCC 8th Edition - Clinical stage from 07/28/2021: Stage IIA (cT2, cN1(f), cM0, G2, ER+, PR+, HER2-) - Signed by Nicholas Lose, MD on 08/16/2021 Stage prefix: Initial diagnosis Method of lymph node assessment: Fine needle aspiration Histologic grading system: 3 grade system     Genetic Testing   [facility] CancerNext (36 genes) was Negative. Report date is 08/05/2021.  The CancerNext gene panel offered by [employer] includes sequencing, rearrangement analysis, and RNA analysis for the following 36 genes:   APC, ATM, AXIN2, BARD1, BMPR1A, BRCA1, BRCA2, BRIP1, CDH1, CDK4, CDKN2A, CHEK2, DICER1, HOXB13, EPCAM, GREM1, MLH1, MSH2, MSH3, MSH6, MUTYH, NBN, NF1, NTHL1, PALB2, PMS2, POLD1, POLE, PTEN, RAD51C, RAD51D, RECQL, SMAD4, SMARCA4, STK11, and TP53.    08/19/2021 Surgery   Right lumpectomy: 5 cm (2 foci contiguous) grade 2 IDC and ILC with  DCIS, involves the inferior margin, ALH, 1/3 lymph nodes positive ER 70 to 95%, PR 50 to 90%, HER2 1+ by IHC, Ki-67 1%   08/30/2021 Cancer Staging   Staging form: Breast, AJCC 8th Edition - Pathologic: Stage IB (pT3, pN1, cM0, G2, ER+, PR+, HER2-) - Signed by Nicholas Lose, MD on 08/30/2021 Stage prefix: Initial diagnosis Multigene prognostic tests performed: MammaPrint Histologic grading system: 3 grade system      CHIEF COMPLIANT: Follow-up of right breast cancer  INTERVAL HISTORY: Tamieka Rancourt is a 61 y.o. with above-mentioned history of right breast cancer and completed radiation therapy yesterday. She presents to the clinic today for follow-up.  She was taking letrozole therapy prior to surgery and she had continued it throughout radiation and has had no major problems tolerating it.  She had multiple questions with relation to her diagnosis and her MammaPrint testing.  She is concerned that originally by mammograms the tumor was extremely small but it turned out to be a fairly large tumor.  She is also concerned that the MammaPrint testing was performed on the biopsy and whether or not it is representative of her entire tumor.  She is also concerned that the mammograms failed to diagnose her cancer earlier on and whether or not she needs additional testing for surveillance.  ALLERGIES:  is allergic to chlorhexidine gluconate.  MEDICATIONS:  Current Outpatient Medications  Medication Sig Dispense Refill   acetaminophen (TYLENOL) 500 MG tablet Take 1,000 mg by mouth every 6 (six) hours as needed for moderate pain.     fexofenadine (ALLEGRA) 180 MG tablet Take 180 mg by mouth daily as needed for allergies or rhinitis.  hydrochlorothiazide (HYDRODIURIL) 25 MG tablet Take 1 tablet (25 mg total) by mouth daily. 90 tablet 1   letrozole (FEMARA) 2.5 MG tablet Take 1 tablet (2.5 mg total) by mouth daily. (Patient taking differently: Take 2.5 mg by mouth every evening.) 90 tablet 3    lisinopril (ZESTRIL) 10 MG tablet Take 1 tablet (10 mg total) by mouth daily. 90 tablet 1   Multiple Vitamin (MULTI-VITAMIN PO) Take 1 capsule by mouth daily.      nebivolol (BYSTOLIC) 10 MG tablet Take 1 tablet (10 mg total) by mouth daily. 90 tablet 2   potassium chloride (KLOR-CON) 10 MEQ tablet Take 1 tablet (10 mEq total) by mouth daily. 90 tablet 1   rosuvastatin (CRESTOR) 10 MG tablet Take 1 tablet (10 mg total) by mouth daily. 90 tablet 1   No current facility-administered medications for this visit.    PHYSICAL EXAMINATION: ECOG PERFORMANCE STATUS: 1 - Symptomatic but completely ambulatory  Vitals:   11/17/21 0907  BP: 134/79  Pulse: 91  Resp: 16  Temp: 97.6 F (36.4 C)  SpO2: 98%   Filed Weights   11/17/21 0907  Weight: 189 lb 1.6 oz (85.8 kg)    LABORATORY DATA:  I have reviewed the data as listed CMP Latest Ref Rng & Units 09/30/2021 09/03/2021 07/28/2021  Glucose 70 - 99 mg/dL - 101(H) 120(H)  BUN 6 - 20 mg/dL - 21(H) 18  Creatinine 0.44 - 1.00 mg/dL - 1.00 1.09(H)  Sodium 135 - 145 mmol/L - 136 136  Potassium 3.5 - 5.1 mmol/L - 4.5 4.3  Chloride 98 - 111 mmol/L - 102 100  CO2 22 - 32 mmol/L - 25 24  Calcium 8.9 - 10.3 mg/dL - 9.6 9.9  Total Protein 6.5 - 8.1 g/dL - - 7.7  Total Bilirubin 0.3 - 1.2 mg/dL - - 0.9  Alkaline Phos 38 - 126 U/L - - 82  AST 15 - 41 U/L - - 23  ALT 0 - 32 IU/L 30 - 22    Lab Results  Component Value Date   WBC 6.1 07/28/2021   HGB 13.7 07/28/2021   HCT 39.7 07/28/2021   MCV 86.7 07/28/2021   PLT 133 (L) 07/28/2021   NEUTROABS 4.2 07/28/2021    ASSESSMENT & PLAN:  Malignant neoplasm of upper-outer quadrant of right breast in female, estrogen receptor positive ([place]) 07/13/2021: Screening mammogram: possible 0.6 cm distortion in the right breast. Diagnostic mammogram and [place]: 0.7 cm distortion in the right breast. Biopsy: grade 1/2 IDC with axillary lymph node (+) involved by metastatic carcinoma, ER+(95%) PR+(90%) Her2- Ki-67  1% Mammaprint: Low Risk Started letrozole October 2022: Tolerating it extremely well. 08/19/2021:Right lumpectomy: 5 cm (2 foci contiguous) grade 2 IDC and ILC with DCIS, involves the inferior margin, ALH, 1/3 lymph nodes positive ER 70 to 95%, PR 50 to 90%, HER2 1+ by IHC, Ki-67 1%   Treatment plan: Adjuvant radiation therapy 10/08/2021-11/16/2021 Followed by adjuvant antiestrogen therapy (she started letrozole prior to surgery) October 2022   letrozole toxicities: None  MammaPrint testing: Original MammaPrint testing was on the biopsy specimen.  Because of final tumor turned out to be extremely large she wanted to make sure that the MammaPrint testing was done on the appropriate/representative specimen.  We will check with pathology to see if the biopsy material and the final pathology are similar.  If there is any concern that the final tumor may be different then we may consider repeating the MammaPrint testing.  Regarding surveillance, I  recommended that we alternate mammograms and MRIs.  She has an appointment in September for mammogram.  Her MRIs will be 6 months from that appointment.  Return to clinic in 3 months for survivorship care plan visit   No orders of the defined types were placed in this encounter.  The patient has a good understanding of the overall plan. she agrees with it. she will call with any problems that may develop before the next visit here.  Total time spent: 30 mins including face to face time and time spent for planning, charting and coordination of care  Rulon Eisenmenger, MD, MPH 11/17/2021  I, Thana Ates, am acting as scribe for Dr. Nicholas Lose.  I have reviewed the above documentation for accuracy and completeness, and I agree with the above.

## 2021-11-17 ENCOUNTER — Inpatient Hospital Stay: Payer: BC Managed Care – PPO | Attending: Hematology and Oncology | Admitting: Hematology and Oncology

## 2021-11-17 ENCOUNTER — Other Ambulatory Visit: Payer: Self-pay

## 2021-11-17 ENCOUNTER — Ambulatory Visit: Payer: BC Managed Care – PPO | Admitting: Hematology and Oncology

## 2021-11-17 ENCOUNTER — Telehealth: Payer: Self-pay | Admitting: *Deleted

## 2021-11-17 DIAGNOSIS — Z79899 Other long term (current) drug therapy: Secondary | ICD-10-CM | POA: Insufficient documentation

## 2021-11-17 DIAGNOSIS — Z923 Personal history of irradiation: Secondary | ICD-10-CM | POA: Insufficient documentation

## 2021-11-17 DIAGNOSIS — Z170 Estrogen receptor positive status [ER+]: Secondary | ICD-10-CM | POA: Diagnosis not present

## 2021-11-17 DIAGNOSIS — Z79811 Long term (current) use of aromatase inhibitors: Secondary | ICD-10-CM | POA: Insufficient documentation

## 2021-11-17 DIAGNOSIS — C50411 Malignant neoplasm of upper-outer quadrant of right female breast: Secondary | ICD-10-CM | POA: Insufficient documentation

## 2021-11-17 DIAGNOSIS — C773 Secondary and unspecified malignant neoplasm of axilla and upper limb lymph nodes: Secondary | ICD-10-CM | POA: Diagnosis not present

## 2021-11-17 NOTE — Telephone Encounter (Signed)
Requested repeat Her2 on surgical sample from pathology.

## 2021-11-22 ENCOUNTER — Encounter: Payer: Self-pay | Admitting: Physical Therapy

## 2021-11-22 ENCOUNTER — Encounter: Payer: Self-pay | Admitting: *Deleted

## 2021-11-22 ENCOUNTER — Other Ambulatory Visit: Payer: Self-pay

## 2021-11-22 ENCOUNTER — Ambulatory Visit: Payer: BC Managed Care – PPO | Attending: Surgery | Admitting: Physical Therapy

## 2021-11-22 ENCOUNTER — Other Ambulatory Visit: Payer: Self-pay | Admitting: *Deleted

## 2021-11-22 DIAGNOSIS — Z483 Aftercare following surgery for neoplasm: Secondary | ICD-10-CM | POA: Insufficient documentation

## 2021-11-22 DIAGNOSIS — Z170 Estrogen receptor positive status [ER+]: Secondary | ICD-10-CM

## 2021-11-22 DIAGNOSIS — C50411 Malignant neoplasm of upper-outer quadrant of right female breast: Secondary | ICD-10-CM

## 2021-11-22 NOTE — Therapy (Signed)
[facility] @ [address] [place], [place], [address] Phone: [phone]   Fax:  [phone]  Physical Therapy Treatment  Patient Details  Name: Tonya Haynes MRN: [id number] Date of Birth: 1961/09/30 Referring Provider (PT): Dr. Donnie Mesa   Encounter Date: 11/22/2021   PT End of Session - 11/22/21 1103     Visit Number 2    Number of Visits 2    PT Start Time 8563    PT Stop Time 1100    PT Time Calculation (min) 20 min    Activity Tolerance Patient tolerated treatment well    Behavior During Therapy [facility] for tasks assessed/performed             Past Medical History:  Diagnosis Date   Anxiety    Asthma    Breast cancer ([place])    Diastolic dysfunction    History of basal cell carcinoma    Hypertension    LBBB (left bundle branch block)    Unspecified asthma(493.90)     Problem list entry automatically replaced. Please review for accuracy.    Past Surgical History:  Procedure Laterality Date   AXILLARY SENTINEL NODE BIOPSY Right 08/19/2021   Procedure: AXILLARY SENTINEL NODE BIOPSY;  Surgeon: Donnie Mesa, MD;  Location: [place];  Service: General;  Laterality: Right;   BASAL CELL CARCINOMA EXCISION     RIGHT DELTOID MUSCLE   BREAST BIOPSY Right 08/11/2021   BREAST LUMPECTOMY WITH RADIOACTIVE SEED AND SENTINEL LYMPH NODE BIOPSY Right 08/19/2021   Procedure: RIGHT BREAST LUMPECTOMY WITH RADIOACTIVE SEED X2;  Surgeon: Donnie Mesa, MD;  Location: [place];  Service: General;  Laterality: Right;  120 MINUTES ROOM 1   RADIOACTIVE SEED GUIDED AXILLARY SENTINEL LYMPH NODE Right 08/19/2021   Procedure: RADIOACTIVE SEED GUIDED RIGHT  AXILLARY SENTINEL LYMPH NODE DISSECTION;  Surgeon: Donnie Mesa, MD;  Location: [place];  Service: General;  Laterality: Right;   RE-EXCISION OF BREAST LUMPECTOMY Right 09/08/2021   Procedure: RE-EXCISION OF INFERIOR MARGIN RIGHT BREAST LUMPECTOMY;  Surgeon: Donnie Mesa, MD;   Location: [place];  Service: General;  Laterality: Right;    There were no vitals filed for this visit.       [facility] PT Assessment - 11/22/21 0001       Observation/Other Assessments   Observations Assessed right lateral proximal trunk edema. Pt c/o swelling in that area and it is visible when viewed looking at her back. She is still recovering from radiation so swelling may be primarily from that.                L-DEX FLOWSHEETS - 11/22/21 1100       L-DEX LYMPHEDEMA SCREENING   Measurement Type Unilateral    L-DEX MEASUREMENT EXTREMITY Upper Extremity    POSITION  Standing    DOMINANT SIDE Right    At Risk Side Right    BASELINE SCORE (UNILATERAL) -2.6    L-DEX SCORE (UNILATERAL) -4.3    VALUE CHANGE (UNILAT) -1.7                                     PT Long Term Goals - 09/16/21 1332       PT LONG TERM GOAL #1   Title Patient will demonstrate she has regained full shoulder ROM and function post operatively compared to baselines.    Time 8  Period Weeks    Status Achieved                   Plan - 11/22/21 1109     Clinical Impression Statement Patient came for SOZO screen but reported right lateral proximal trunk edema, right medial upper arm nerve symptoms, and tightness at her incision site which she feels when doing certain stretches. We discussed doing a short course of PT to imrpove those symptoms. She will benefit from additional stretching exercises and manual therapy - although may need to with 2-3 weeks to do manual therapy as she compelted radiation 1 week ago.    PT Treatment/Interventions ADLs/Self Care Home Management;Patient/family education    PT Next Visit Plan Eval pt focusing on right medial upper arm nerve symptoms, right chest tightness, and right lateral proximal trunk edema.    Consulted and Agree with Plan of Care Patient             Patient will benefit from skilled therapeutic  intervention in order to improve the following deficits and impairments:     Visit Diagnosis: Aftercare following surgery for neoplasm     Problem List Patient Active Problem List   Diagnosis Date Noted   Genetic testing 08/06/2021   Family history of breast cancer 07/29/2021   Malignant neoplasm of upper-outer quadrant of right breast in female, estrogen receptor positive ([place]) 07/23/2021   Essential hypertension, malignant 12/12/2016   Hypertension    LBBB (left bundle branch block)    Anxiety    Unspecified BSJGGE(366.29)    Diastolic dysfunction    History of basal cell carcinoma    Annia Friendly, PT 11/22/21 11:12 AM   [facility] @ [address] [place], [place], [address] Phone: [phone]   Fax:  [phone]  Name: Tonya Haynes MRN: [id number] Date of Birth: 08/05/1961

## 2021-11-23 ENCOUNTER — Encounter: Payer: Self-pay | Admitting: Rehabilitation

## 2021-11-23 ENCOUNTER — Ambulatory Visit: Payer: BC Managed Care – PPO | Attending: Hematology and Oncology | Admitting: Rehabilitation

## 2021-11-23 DIAGNOSIS — Z483 Aftercare following surgery for neoplasm: Secondary | ICD-10-CM | POA: Insufficient documentation

## 2021-11-23 DIAGNOSIS — R600 Localized edema: Secondary | ICD-10-CM | POA: Insufficient documentation

## 2021-11-23 DIAGNOSIS — Z170 Estrogen receptor positive status [ER+]: Secondary | ICD-10-CM | POA: Insufficient documentation

## 2021-11-23 DIAGNOSIS — C50411 Malignant neoplasm of upper-outer quadrant of right female breast: Secondary | ICD-10-CM | POA: Insufficient documentation

## 2021-11-23 DIAGNOSIS — R293 Abnormal posture: Secondary | ICD-10-CM | POA: Insufficient documentation

## 2021-11-23 NOTE — Therapy (Signed)
[facility] @ [place], [place], [address] Phone: [phone]   Fax:  [phone]  Physical Therapy Treatment  Patient Details  Name: Tonya Haynes MRN: [id number] Date of Birth: September 20, 1961 Referring Provider (PT): Dr. Lindi Adie   Encounter Date: 11/23/2021   PT End of Session - 11/23/21 1716     Visit Number 3    Number of Visits 11    Date for PT Re-Evaluation 01/04/22    PT Start Time 1500    PT Stop Time 1550    PT Time Calculation (min) 50 min    Activity Tolerance Patient tolerated treatment well    Behavior During Therapy [facility] for tasks assessed/performed             Past Medical History:  Diagnosis Date   Anxiety    Asthma    Breast cancer ([place])    Diastolic dysfunction    History of basal cell carcinoma    Hypertension    LBBB (left bundle branch block)    Unspecified asthma(493.90)     Problem list entry automatically replaced. Please review for accuracy.    Past Surgical History:  Procedure Laterality Date   AXILLARY SENTINEL NODE BIOPSY Right 08/19/2021   Procedure: AXILLARY SENTINEL NODE BIOPSY;  Surgeon: Donnie Mesa, MD;  Location: [place];  Service: General;  Laterality: Right;   BASAL CELL CARCINOMA EXCISION     RIGHT DELTOID MUSCLE   BREAST BIOPSY Right 08/11/2021   BREAST LUMPECTOMY WITH RADIOACTIVE SEED AND SENTINEL LYMPH NODE BIOPSY Right 08/19/2021   Procedure: RIGHT BREAST LUMPECTOMY WITH RADIOACTIVE SEED X2;  Surgeon: Donnie Mesa, MD;  Location: [place];  Service: General;  Laterality: Right;  120 MINUTES ROOM 1   RADIOACTIVE SEED GUIDED AXILLARY SENTINEL LYMPH NODE Right 08/19/2021   Procedure: RADIOACTIVE SEED GUIDED RIGHT  AXILLARY SENTINEL LYMPH NODE DISSECTION;  Surgeon: Donnie Mesa, MD;  Location: [place];  Service: General;  Laterality: Right;   RE-EXCISION OF BREAST LUMPECTOMY Right 09/08/2021   Procedure: RE-EXCISION OF INFERIOR MARGIN RIGHT BREAST LUMPECTOMY;   Surgeon: Donnie Mesa, MD;  Location: [place];  Service: General;  Laterality: Right;    There were no vitals filed for this visit.   Subjective Assessment - 11/23/21 1717     Subjective Pt returns to therapy wanting to know if there is any new things she should be doing for recovery    Pertinent History Patient was diagnosed on 06/18/2021 with right grade I invasive ductal carcinoma breast cancer. It measures 7 mm and is located in the upper outer quadrant. It is ER/PR positive and HER2 negative with a Ki67 of 1%. She has a biopsied positive axillary lymph node. Rt lumpectomy 08/19/21 with 1/3 LN positive.  Radiation completed 11/16/21. Antiestrogen therapy.    Patient Stated Goals do I need to do anything else?    Currently in Pain? No/denies   gets intermittent Rt upper arm pain and breast pain               [facility] PT Assessment - 11/23/21 0001       Assessment   Medical Diagnosis Right breast cancer    Referring Provider (PT) Dr. Lindi Adie    Onset Date/Surgical Date 06/18/21    Hand Dominance Right    Prior Therapy none      Precautions   Precautions Other (comment)    Precaution Comments lymphedema risk Rt UE      Restrictions  Weight Bearing Restrictions No      Balance Screen   Has the patient fallen in the past 6 months No    Has the patient had a decrease in activity level because of a fear of falling?  No    Is the patient reluctant to leave their home because of a fear of falling?  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    Leisure She walks 30 min each day, would like to get back to yoga, lifts weights      Cognition   Overall Cognitive Status Within Functional Limits for  tasks assessed      Observation/Other Assessments   Observations blistered and draining anterior breast near nipple per pt did not observe today as it was covered - wearing compression bra - healing redness axilla, subclavicular area      Sensation   Additional Comments numbness Rt axilla and upper arm      Coordination   Gross Motor Movements are Fluid and Coordinated Yes      Posture/Postural Control   Posture/Postural Control Postural limitations    Postural Limitations Rounded Shoulders;Forward head      ROM / Strength   AROM / PROM / Strength PROM      AROM   Right Shoulder Flexion 165 Degrees    Right Shoulder ABduction 165 Degrees    Left Shoulder Flexion 160 Degrees    Left Shoulder ABduction 170 Degrees      PROM   Overall PROM Comments full PROM in supine      Strength   Overall Strength Within functional limits for tasks performed               LYMPHEDEMA/ONCOLOGY QUESTIONNAIRE - 11/23/21 0001       Type   Cancer Type Right breast cancer      Surgeries   Lumpectomy Date 08/19/21    Sentinel Lymph Node Biopsy Date 08/19/21    Other Surgery Date 09/06/21   re-excision   Number Lymph Nodes Removed 3   1 positive     Treatment   Active Chemotherapy Treatment No    Past Chemotherapy Treatment No    Active Radiation Treatment No    Past Radiation Treatment Yes    Current Hormone Treatment Yes                        [facility] Adult PT Treatment/Exercise - 11/23/21 0001       Self-Care   Self-Care Other Self-Care Comments    Other Self-Care Comments  discussed pts current HEP which is very good at this point and let her know that this PT would not add any additional exercises except for single arm row and that she is doing a very good job.  Pt is currently doing 3# abduction, flexion, bicep curls, doorway stretch, LTR with goal post arms, single arm chest stretch. Pt is also getting back into yoga so we discussed how chest opening will  continue to be beneficial.  Also lengthy discussion regarding healing of radiation and importance of continued stretches.  Pt is currently trying to get back in to her compression bra  post radiation and use her foam for the lateral trunk edema.  Hard to assess edema today due to radiation healing but pt was educated on returning to PT after the skin has healed if the edema is still persistent.  Pt agreeable to plan.                     PT Education - 11/23/21 1724     Education Details updated HEP, radiation healing, POC    Person(s) Educated Patient    Methods Explanation;Demonstration;Tactile cues;Verbal cues;Handout    Comprehension Verbalized understanding;Returned demonstration;Verbal cues required;Tactile cues required                 PT Long Term Goals - 11/23/21 1725       PT LONG TERM GOAL #1   Title Patient will demonstrate she has regained full shoulder ROM and function post operatively compared to baselines.    Baseline 5 deg away from abduction    Status Achieved      PT LONG TERM GOAL #2   Title Pt will be educated on appropriate HEP for post radiation    Time 6    Period Weeks    Status New                   Plan - 11/23/21 1722     Clinical Impression Statement discussed pts current HEP which is very good at this point and let her know that this PT would not add any additional exercises except for single arm row and that she is doing a very good job.  Pt is currently doing 3# abduction, flexion, bicep curls, doorway stretch, LTR with goal post arms, single arm chest stretch. Pt is also getting back into yoga so we discussed how chest opening will continue to be beneficial.  Also lengthy discussion regarding healing of radiation and importance of continued stretches.  Pt is currently trying to get back in to her compression bra post radiation and use her foam for the lateral trunk edema.  Hard to assess edema today due to radiation healing but  pt was educated on returning to PT after the skin has healed if the edema is still persistent.  Pt agreeable to plan.    Stability/Clinical Decision Making Stable/Uncomplicated    PT Frequency 1x / week    PT Duration 6 weeks    PT Treatment/Interventions ADLs/Self Care Home Management;Patient/family education;Therapeutic exercise;Manual lymph drainage;Manual techniques    PT Next Visit Plan start treatment if pt returns for persistent edema or pain    PT Home Exercise Plan HEP emailed but code lost before documentint    Consulted and Agree with Plan of Care Patient             Patient will benefit from skilled therapeutic intervention in order to improve the following deficits and impairments:     Visit Diagnosis: Aftercare following surgery for neoplasm - Plan: PT plan of care cert/re-cert  Malignant neoplasm of upper-outer quadrant of right breast in female, estrogen receptor positive ([place]) - Plan: PT plan of care cert/re-cert  Abnormal posture - Plan: PT plan of care cert/re-cert  Localized edema - Plan: PT plan of care cert/re-cert     Problem List Patient Active Problem List   Diagnosis Date Noted   Genetic testing 08/06/2021   Family history of breast cancer 07/29/2021   Malignant neoplasm of upper-outer quadrant of right breast in female, estrogen receptor positive ([place]) 07/23/2021  Essential hypertension, malignant 12/12/2016   Hypertension    LBBB (left bundle branch block)    Anxiety    Unspecified YDSWVT(915.04)    Diastolic dysfunction    History of basal cell carcinoma     Stark Bray, PT 11/23/2021, 5:26 PM  [facility] @ [place], [place], [address] Phone: [phone]   Fax:  [phone]  Name: Tonya Haynes MRN: [id number] Date of Birth: 1961-06-17

## 2021-11-24 ENCOUNTER — Telehealth: Payer: Self-pay | Admitting: *Deleted

## 2021-11-24 NOTE — Telephone Encounter (Signed)
Ordered mammaprint on surgical Lymph node per Dr. Lindi Adie. Faxed requisition to [facility] and pathology.

## 2021-11-30 ENCOUNTER — Encounter (HOSPITAL_COMMUNITY): Payer: Self-pay

## 2021-12-01 ENCOUNTER — Ambulatory Visit: Payer: BC Managed Care – PPO | Admitting: Rehabilitation

## 2021-12-01 NOTE — Progress Notes (Signed)
° °                                                                                                                                                          °  Patient Name: Tonya Haynes MRN: [id number] DOB: 06-25-1961 Referring Physician: Nicholas Lose (Profile Not Attached) Date of Service: 11/16/2021 [facility] [place]                                                        End Of Treatment Note  Diagnoses: C50.411-Malignant neoplasm of upper-outer quadrant of right female breast  Cancer Staging:  Cancer Staging  Malignant neoplasm of upper-outer quadrant of right breast in female, estrogen receptor positive ([place]) Staging form: Breast, AJCC 8th Edition - Clinical stage from 07/28/2021: Stage IIA (cT2, cN1(f), cM0, G2, ER+, PR+, HER2-) - Signed by Nicholas Lose, MD on 08/16/2021 Stage prefix: Initial diagnosis Method of lymph node assessment: Fine needle aspiration Histologic grading system: 3 grade system - Pathologic: Stage IB (pT3, pN1, cM0, G2, ER+, PR+, HER2-) - Signed by Nicholas Lose, MD on 08/30/2021 Stage prefix: Initial diagnosis Multigene prognostic tests performed: MammaPrint Histologic grading system: 3 grade system  Intent: Curative  Radiation Treatment Dates: 10/06/2021 through 11/16/2021 Site Technique Total Dose (Gy) Dose per Fx (Gy) Completed Fx Beam Energies  Breast, Right: Breast_R 3D 50/50 2 25/25 10X  Breast, Right: Breast_R_PAB_SCV 3D 50/50 2 25/25 6X, 15X  Breast, Right: Breast_R_Bst 3D 10/10 2 5/5 6X   Narrative: The patient tolerated radiation therapy relatively well.   Plan: The patient will follow-up with radiation oncology in 106mo.  -----------------------------------  Eppie Gibson, MD

## 2021-12-03 ENCOUNTER — Encounter: Payer: BC Managed Care – PPO | Admitting: Rehabilitation

## 2021-12-06 ENCOUNTER — Encounter: Payer: BC Managed Care – PPO | Admitting: Rehabilitation

## 2021-12-07 NOTE — Progress Notes (Incomplete)
? ?Patient Care Team: ?Harlan Stains, MD as PCP - General (Family Medicine) ?Nahser, Wonda Cheng, MD as PCP - Cardiology (Cardiology) ?Rockwell Germany, RN as Oncology Nurse Navigator ?Mauro Kaufmann, RN as Oncology Nurse Navigator ?Donnie Mesa, MD as Consulting Physician (General Surgery) ?Nicholas Lose, MD as Consulting Physician (Hematology and Oncology) ?Eppie Gibson, MD as Attending Physician (Radiation Oncology) ? ?DIAGNOSIS: No diagnosis found. ? ?SUMMARY OF ONCOLOGIC HISTORY: ?Oncology History  ?Malignant neoplasm of upper-outer quadrant of right breast in female, estrogen receptor positive ([place])  ?07/13/2021 Initial Diagnosis  ? Screening mammogram: possible 0.6 cm distortion in the right breast. Diagnostic mammogram and [place]: 0.7 cm distortion in the right breast. Biopsy: grade 1/2 IDC with axillary lymph node (+) involved by metastatic carcinoma, ER+(95%) PR+(90%) Her2- Ki-67 1% ?  ?07/28/2021 Cancer Staging  ? Staging form: Breast, AJCC 8th Edition ?- Clinical stage from 07/28/2021: Stage IIA (cT2, cN1(f), cM0, G2, ER+, PR+, HER2-) - Signed by Nicholas Lose, MD on 08/16/2021 ?Stage prefix: Initial diagnosis ?Method of lymph node assessment: Fine needle aspiration ?Histologic grading system: 3 grade system ? ?  ? Genetic Testing  ? [employer] CancerNext (36 genes) was Negative. Report date is 08/05/2021. ? ?The CancerNext gene panel offered by [employer] includes sequencing, rearrangement analysis, and RNA analysis for the following 36 genes:   APC, ATM, AXIN2, BARD1, BMPR1A, BRCA1, BRCA2, BRIP1, CDH1, CDK4, CDKN2A, CHEK2, DICER1, HOXB13, EPCAM, GREM1, MLH1, MSH2, MSH3, MSH6, MUTYH, NBN, NF1, NTHL1, PALB2, PMS2, POLD1, POLE, PTEN, RAD51C, RAD51D, RECQL, SMAD4, SMARCA4, STK11, and TP53.  ?  ?08/19/2021 Surgery  ? Right lumpectomy: 5 cm (2 foci contiguous) grade 2 IDC and ILC with DCIS, involves the inferior margin, ALH, 1/3 lymph nodes positive ER 70 to 95%, PR 50 to 90%, HER2 1+ by IHC, Ki-67 1% ?   ?08/30/2021 Cancer Staging  ? Staging form: Breast, AJCC 8th Edition ?- Pathologic: Stage IB (pT3, pN1, cM0, G2, ER+, PR+, HER2-) - Signed by Nicholas Lose, MD on 08/30/2021 ?Stage prefix: Initial diagnosis ?Multigene prognostic tests performed: MammaPrint ?Histologic grading system: 3 grade system ? ?  ? ? ?CHIEF COMPLIANT: Follow-up of right breast cancer ? ?INTERVAL HISTORY: Tonya Haynes is a 60 y.o. with above-mentioned history of right breast cancer having completed radiation therapy, currently on antiestrogen therapy with letrozole. She presents to the clinic today for follow-up.  ? ?ALLERGIES:  is allergic to chlorhexidine gluconate. ? ?MEDICATIONS:  ?Current Outpatient Medications  ?Medication Sig Dispense Refill  ? acetaminophen (TYLENOL) 500 MG tablet Take 1,000 mg by mouth every 6 (six) hours as needed for moderate pain.    ? fexofenadine (ALLEGRA) 180 MG tablet Take 180 mg by mouth daily as needed for allergies or rhinitis.    ? hydrochlorothiazide (HYDRODIURIL) 25 MG tablet Take 1 tablet (25 mg total) by mouth daily. 90 tablet 1  ? letrozole (FEMARA) 2.5 MG tablet Take 1 tablet (2.5 mg total) by mouth daily. (Patient taking differently: Take 2.5 mg by mouth every evening.) 90 tablet 3  ? lisinopril (ZESTRIL) 10 MG tablet Take 1 tablet (10 mg total) by mouth daily. 90 tablet 1  ? Multiple Vitamin (MULTI-VITAMIN PO) Take 1 capsule by mouth daily.     ? nebivolol (BYSTOLIC) 10 MG tablet Take 1 tablet (10 mg total) by mouth daily. 90 tablet 2  ? potassium chloride (KLOR-CON) 10 MEQ tablet Take 1 tablet (10 mEq total) by mouth daily. 90 tablet 1  ? rosuvastatin (CRESTOR) 10 MG tablet Take 1 tablet (10  mg total) by mouth daily. 90 tablet 1  ? ?No current facility-administered medications for this visit.  ? ? ?PHYSICAL EXAMINATION: ?ECOG PERFORMANCE STATUS: {CHL ONC ECOG YO:[id number]} ? ?There were no vitals filed for this visit. ?There were no vitals filed for this visit. ? ?BREAST:*** No palpable  masses or nodules in either right or left breasts. No palpable axillary supraclavicular or infraclavicular adenopathy no breast tenderness or nipple discharge. (exam performed in the presence of a chaperone) ? ?LABORATORY DATA:  ?I have reviewed the data as listed ?CMP Latest Ref Rng & Units 09/30/2021 09/03/2021 07/28/2021  ?Glucose 70 - 99 mg/dL - 101(H) 120(H)  ?BUN 6 - 20 mg/dL - 21(H) 18  ?Creatinine 0.44 - 1.00 mg/dL - 1.00 1.09(H)  ?Sodium 135 - 145 mmol/L - 136 136  ?Potassium 3.5 - 5.1 mmol/L - 4.5 4.3  ?Chloride 98 - 111 mmol/L - 102 100  ?CO2 22 - 32 mmol/L - 25 24  ?Calcium 8.9 - 10.3 mg/dL - 9.6 9.9  ?Total Protein 6.5 - 8.1 g/dL - - 7.7  ?Total Bilirubin 0.3 - 1.2 mg/dL - - 0.9  ?Alkaline Phos 38 - 126 U/L - - 82  ?AST 15 - 41 U/L - - 23  ?ALT 0 - 32 IU/L 30 - 22  ? ? ?Lab Results  ?Component Value Date  ? WBC 6.1 07/28/2021  ? HGB 13.7 07/28/2021  ? HCT 39.7 07/28/2021  ? MCV 86.7 07/28/2021  ? PLT 133 (L) 07/28/2021  ? NEUTROABS 4.2 07/28/2021  ? ? ?ASSESSMENT & PLAN:  ?No problem-specific Assessment & Plan notes found for this encounter. ? ? ? ?No orders of the defined types were placed in this encounter. ? ?The patient has a good understanding of the overall plan. she agrees with it. she will call with any problems that may develop before the next visit here. ? ?Total time spent: *** mins including face to face time and time spent for planning, charting and coordination of care ? ?Rulon Eisenmenger, MD, MPH ?12/07/2021 ? ?I, Thana Ates, am acting as scribe for Dr. Nicholas Lose. ? ?{insert scribe attestation} ? ? ? ? ? ?

## 2021-12-08 ENCOUNTER — Other Ambulatory Visit: Payer: Self-pay

## 2021-12-08 ENCOUNTER — Inpatient Hospital Stay: Payer: BC Managed Care – PPO | Attending: Hematology and Oncology | Admitting: Hematology and Oncology

## 2021-12-08 ENCOUNTER — Inpatient Hospital Stay: Payer: BC Managed Care – PPO | Admitting: Hematology and Oncology

## 2021-12-08 DIAGNOSIS — C50411 Malignant neoplasm of upper-outer quadrant of right female breast: Secondary | ICD-10-CM | POA: Insufficient documentation

## 2021-12-08 DIAGNOSIS — Z79899 Other long term (current) drug therapy: Secondary | ICD-10-CM | POA: Diagnosis not present

## 2021-12-08 DIAGNOSIS — C773 Secondary and unspecified malignant neoplasm of axilla and upper limb lymph nodes: Secondary | ICD-10-CM | POA: Diagnosis not present

## 2021-12-08 DIAGNOSIS — Z170 Estrogen receptor positive status [ER+]: Secondary | ICD-10-CM | POA: Insufficient documentation

## 2021-12-08 NOTE — Assessment & Plan Note (Signed)
07/13/2021:?Screening mammogram: possible 0.6 cm distortion in the right breast. Diagnostic mammogram and [place]: 0.7 cm distortion in the right breast. Biopsy: grade 1/2 IDC with axillary lymph node (+) involved by metastatic carcinoma, ER+(95%) PR+(90%) Her2- Ki-67 1% ?Mammaprint: Low Risk ?Started letrozole October 2022: Tolerating it extremely well. ?08/19/2021:Right lumpectomy: 5 cm (2 foci contiguous) grade 2 IDC and ILC with DCIS, involves the inferior margin, ALH, 1/3 lymph nodes positive ER 70 to 95%, PR 50 to 90%, HER2 1+ by IHC, Ki-67 1% ?MammaPrint on initial biopsy: Low risk ?MammaPrint on the final lymph node biopsy: High risk ? ?Treatment plan: ?1. Adjuvant radiation therapy ?2. Followed by adjuvant antiestrogen therapy (she started letrozole prior to surgery) ?? ?She took letrozole prior to surgery and has tolerated it extremely well. ?

## 2021-12-08 NOTE — Progress Notes (Signed)
? ?Patient Care Team: ?Harlan Stains, MD as PCP - General (Family Medicine) ?Nahser, Wonda Cheng, MD as PCP - Cardiology (Cardiology) ?Rockwell Germany, RN as Oncology Nurse Navigator ?Mauro Kaufmann, RN as Oncology Nurse Navigator ?Donnie Mesa, MD as Consulting Physician (General Surgery) ?Nicholas Lose, MD as Consulting Physician (Hematology and Oncology) ?Eppie Gibson, MD as Attending Physician (Radiation Oncology) ? ?DIAGNOSIS:  ?Encounter Diagnosis  ?Name Primary?  ? Malignant neoplasm of upper-outer quadrant of right breast in female, estrogen receptor positive ([place])   ? ? ?SUMMARY OF ONCOLOGIC HISTORY: ?Oncology History  ?Malignant neoplasm of upper-outer quadrant of right breast in female, estrogen receptor positive ([place])  ?07/13/2021 Initial Diagnosis  ? Screening mammogram: possible 0.6 cm distortion in the right breast. Diagnostic mammogram and [place]: 0.7 cm distortion in the right breast. Biopsy: grade 1/2 IDC with axillary lymph node (+) involved by metastatic carcinoma, ER+(95%) PR+(90%) Her2- Ki-67 1% ?  ?07/28/2021 Cancer Staging  ? Staging form: Breast, AJCC 8th Edition ?- Clinical stage from 07/28/2021: Stage IIA (cT2, cN1(f), cM0, G2, ER+, PR+, HER2-) - Signed by Nicholas Lose, MD on 08/16/2021 ?Stage prefix: Initial diagnosis ?Method of lymph node assessment: Fine needle aspiration ?Histologic grading system: 3 grade system ? ?  ? Genetic Testing  ? [employer] CancerNext (36 genes) was Negative. Report date is 08/05/2021. ? ?The CancerNext gene panel offered by [employer] includes sequencing, rearrangement analysis, and RNA analysis for the following 36 genes:   APC, ATM, AXIN2, BARD1, BMPR1A, BRCA1, BRCA2, BRIP1, CDH1, CDK4, CDKN2A, CHEK2, DICER1, HOXB13, EPCAM, GREM1, MLH1, MSH2, MSH3, MSH6, MUTYH, NBN, NF1, NTHL1, PALB2, PMS2, POLD1, POLE, PTEN, RAD51C, RAD51D, RECQL, SMAD4, SMARCA4, STK11, and TP53.  ?  ?08/19/2021 Surgery  ? Right lumpectomy: 5 cm (2 foci contiguous) grade 2 IDC and ILC with  DCIS, involves the inferior margin, ALH, 1/3 lymph nodes positive ER 70 to 95%, PR 50 to 90%, HER2 1+ by IHC, Ki-67 1% ?  ?08/30/2021 Cancer Staging  ? Staging form: Breast, AJCC 8th Edition ?- Pathologic: Stage IB (pT3, pN1, cM0, G2, ER+, PR+, HER2-) - Signed by Nicholas Lose, MD on 08/30/2021 ?Stage prefix: Initial diagnosis ?Multigene prognostic tests performed: MammaPrint ?Histologic grading system: 3 grade system ? ?  ? ? ?CHIEF COMPLIANT: Follow-up to discuss results of the MammaPrint on the lymph node biopsy ? ?INTERVAL HISTORY: Tonya Haynes is a 61 year old above-mentioned history of right breast cancer treated with lumpectomy and had 1 positive lymph node.  She was found to be low risk on the MammaPrint on the initial biopsy specimen.  We performed the MammaPrint testing on the lymph node and she is here today to discuss results. ? ? ?ALLERGIES:  is allergic to chlorhexidine gluconate. ? ?MEDICATIONS:  ?Current Outpatient Medications  ?Medication Sig Dispense Refill  ? acetaminophen (TYLENOL) 500 MG tablet Take 1,000 mg by mouth every 6 (six) hours as needed for moderate pain.    ? fexofenadine (ALLEGRA) 180 MG tablet Take 180 mg by mouth daily as needed for allergies or rhinitis.    ? hydrochlorothiazide (HYDRODIURIL) 25 MG tablet Take 1 tablet (25 mg total) by mouth daily. 90 tablet 1  ? letrozole (FEMARA) 2.5 MG tablet Take 1 tablet (2.5 mg total) by mouth daily. (Patient taking differently: Take 2.5 mg by mouth every evening.) 90 tablet 3  ? lisinopril (ZESTRIL) 10 MG tablet Take 1 tablet (10 mg total) by mouth daily. 90 tablet 1  ? Multiple Vitamin (MULTI-VITAMIN PO) Take 1 capsule by mouth daily.     ?  nebivolol (BYSTOLIC) 10 MG tablet Take 1 tablet (10 mg total) by mouth daily. 90 tablet 2  ? potassium chloride (KLOR-CON) 10 MEQ tablet Take 1 tablet (10 mEq total) by mouth daily. 90 tablet 1  ? rosuvastatin (CRESTOR) 10 MG tablet Take 1 tablet (10 mg total) by mouth daily. 90 tablet 1  ? ?No  current facility-administered medications for this visit.  ? ? ?PHYSICAL EXAMINATION: ?ECOG PERFORMANCE STATUS: 1 - Symptomatic but completely ambulatory ? ?Vitals:  ? 12/08/21 1439  ?BP: (!) 154/89  ?Pulse: 96  ?Resp: 18  ?Temp: (!) 97.4 ?F (36.3 ?C)  ?SpO2: 99%  ? ?Filed Weights  ? 12/08/21 1439  ?Weight: 186 lb 12.8 oz (84.7 kg)  ?  ? ?LABORATORY DATA:  ?I have reviewed the data as listed ?CMP Latest Ref Rng & Units 09/30/2021 09/03/2021 07/28/2021  ?Glucose 70 - 99 mg/dL - 101(H) 120(H)  ?BUN 6 - 20 mg/dL - 21(H) 18  ?Creatinine 0.44 - 1.00 mg/dL - 1.00 1.09(H)  ?Sodium 135 - 145 mmol/L - 136 136  ?Potassium 3.5 - 5.1 mmol/L - 4.5 4.3  ?Chloride 98 - 111 mmol/L - 102 100  ?CO2 22 - 32 mmol/L - 25 24  ?Calcium 8.9 - 10.3 mg/dL - 9.6 9.9  ?Total Protein 6.5 - 8.1 g/dL - - 7.7  ?Total Bilirubin 0.3 - 1.2 mg/dL - - 0.9  ?Alkaline Phos 38 - 126 U/L - - 82  ?AST 15 - 41 U/L - - 23  ?ALT 0 - 32 IU/L 30 - 22  ? ? ?Lab Results  ?Component Value Date  ? WBC 6.1 07/28/2021  ? HGB 13.7 07/28/2021  ? HCT 39.7 07/28/2021  ? MCV 86.7 07/28/2021  ? PLT 133 (L) 07/28/2021  ? NEUTROABS 4.2 07/28/2021  ? ? ?ASSESSMENT & PLAN:  ?Malignant neoplasm of upper-outer quadrant of right breast in female, estrogen receptor positive ([place]) ?07/13/2021: Screening mammogram: possible 0.6 cm distortion in the right breast. Diagnostic mammogram and [place]: 0.7 cm distortion in the right breast. Biopsy: grade 1/2 IDC with axillary lymph node (+) involved by metastatic carcinoma, ER+(95%) PR+(90%) Her2- Ki-67 1% ?Mammaprint: Low Risk ?Started letrozole October 2022: Tolerating it extremely well. ?08/19/2021:Right lumpectomy: 5 cm (2 foci contiguous) grade 2 IDC and ILC with DCIS, involves the inferior margin, ALH, 1/3 lymph nodes positive ER 70 to 95%, PR 50 to 90%, HER2 1+ by IHC, Ki-67 1% ?MammaPrint on initial biopsy: Low risk ?MammaPrint on the final lymph node biopsy: High risk (but the MammaPrint index was extremely low and therefore the  probability of benefit from chemotherapy was extremely small) ? ?Treatment plan: ?Adjuvant radiation therapy ?Followed by adjuvant antiestrogen therapy (she started letrozole prior to surgery) ?  ?She took letrozole prior to surgery and has tolerated it extremely well. ? ? ?No orders of the defined types were placed in this encounter. ? ?The patient has a good understanding of the overall plan. she agrees with it. she will call with any problems that may develop before the next visit here. ?Total time spent: 30 mins including face to face time and time spent for planning, charting and co-ordination of care ? ? Harriette Ohara, MD ?12/08/21 ? ? ? ?

## 2021-12-09 ENCOUNTER — Encounter: Payer: Self-pay | Admitting: Hematology and Oncology

## 2021-12-09 ENCOUNTER — Encounter (HOSPITAL_COMMUNITY): Payer: Self-pay

## 2021-12-10 ENCOUNTER — Encounter: Payer: BC Managed Care – PPO | Admitting: Rehabilitation

## 2021-12-16 ENCOUNTER — Encounter: Payer: Self-pay | Admitting: Hematology and Oncology

## 2021-12-22 ENCOUNTER — Ambulatory Visit: Payer: BC Managed Care – PPO | Admitting: Hematology and Oncology

## 2021-12-22 ENCOUNTER — Other Ambulatory Visit: Payer: Self-pay

## 2021-12-22 DIAGNOSIS — E782 Mixed hyperlipidemia: Secondary | ICD-10-CM

## 2021-12-22 DIAGNOSIS — E7800 Pure hypercholesterolemia, unspecified: Secondary | ICD-10-CM

## 2021-12-22 DIAGNOSIS — Z79899 Other long term (current) drug therapy: Secondary | ICD-10-CM

## 2021-12-22 DIAGNOSIS — E785 Hyperlipidemia, unspecified: Secondary | ICD-10-CM

## 2021-12-22 MED ORDER — ROSUVASTATIN CALCIUM 10 MG PO TABS: 10.0000 mg | ORAL_TABLET | Freq: Every day | ORAL | 1 refills | Status: DC

## 2021-12-24 ENCOUNTER — Ambulatory Visit: Payer: BC Managed Care – PPO | Admitting: Radiation Oncology

## 2022-01-03 ENCOUNTER — Inpatient Hospital Stay: Payer: BC Managed Care – PPO | Attending: Hematology and Oncology | Admitting: Adult Health

## 2022-01-03 ENCOUNTER — Encounter: Payer: Self-pay | Admitting: Adult Health

## 2022-01-03 ENCOUNTER — Other Ambulatory Visit: Payer: Self-pay

## 2022-01-03 ENCOUNTER — Other Ambulatory Visit: Payer: BC Managed Care – PPO | Admitting: *Deleted

## 2022-01-03 VITALS — BP 149/99 | HR 78 | Temp 97.7°F | Resp 18 | Ht 68.0 in | Wt 183.1 lb

## 2022-01-03 DIAGNOSIS — Z8269 Family history of other diseases of the musculoskeletal system and connective tissue: Secondary | ICD-10-CM | POA: Diagnosis not present

## 2022-01-03 DIAGNOSIS — E2839 Other primary ovarian failure: Secondary | ICD-10-CM | POA: Diagnosis not present

## 2022-01-03 DIAGNOSIS — I10 Essential (primary) hypertension: Secondary | ICD-10-CM | POA: Insufficient documentation

## 2022-01-03 DIAGNOSIS — Z85828 Personal history of other malignant neoplasm of skin: Secondary | ICD-10-CM | POA: Diagnosis not present

## 2022-01-03 DIAGNOSIS — Z803 Family history of malignant neoplasm of breast: Secondary | ICD-10-CM | POA: Diagnosis not present

## 2022-01-03 DIAGNOSIS — Z801 Family history of malignant neoplasm of trachea, bronchus and lung: Secondary | ICD-10-CM | POA: Diagnosis not present

## 2022-01-03 DIAGNOSIS — C50411 Malignant neoplasm of upper-outer quadrant of right female breast: Secondary | ICD-10-CM | POA: Diagnosis present

## 2022-01-03 DIAGNOSIS — E785 Hyperlipidemia, unspecified: Secondary | ICD-10-CM

## 2022-01-03 DIAGNOSIS — Z8249 Family history of ischemic heart disease and other diseases of the circulatory system: Secondary | ICD-10-CM | POA: Diagnosis not present

## 2022-01-03 DIAGNOSIS — Z170 Estrogen receptor positive status [ER+]: Secondary | ICD-10-CM | POA: Diagnosis not present

## 2022-01-03 DIAGNOSIS — Z800 Family history of malignant neoplasm of digestive organs: Secondary | ICD-10-CM | POA: Diagnosis not present

## 2022-01-03 DIAGNOSIS — Z79899 Other long term (current) drug therapy: Secondary | ICD-10-CM | POA: Insufficient documentation

## 2022-01-03 LAB — LIPID PANEL
Chol/HDL Ratio: 2.3 ratio (ref 0.0–4.4)
Cholesterol, Total: 133 mg/dL (ref 100–199)
HDL: 57 mg/dL (ref >39)
LDL Chol Calc (NIH): 61 mg/dL (ref 0–99)
Triglycerides: 79 mg/dL (ref 0–149)
VLDL Cholesterol Cal: 15 mg/dL (ref 5–40)

## 2022-01-03 LAB — ALT: ALT: 24 IU/L (ref 0–32)

## 2022-01-03 NOTE — Progress Notes (Signed)
SURVIVORSHIP VIRTUAL VISIT: ? ? ?BRIEF ONCOLOGIC HISTORY:  ?Oncology History  ?Malignant neoplasm of upper-outer quadrant of right breast in female, estrogen receptor positive ([place])  ?07/13/2021 Initial Diagnosis  ? Screening mammogram: possible 0.6 cm distortion in the right breast. Diagnostic mammogram and [place]: 0.7 cm distortion in the right breast. Biopsy: grade 1/2 IDC with axillary lymph node (+) involved by metastatic carcinoma, ER+(95%) PR+(90%) Her2- Ki-67 1% ?  ?07/28/2021 Cancer Staging  ? Staging form: Breast, AJCC 8th Edition ?- Clinical stage from 07/28/2021: Stage IIA (cT2, cN1(f), cM0, G2, ER+, PR+, HER2-) - Signed by Nicholas Lose, MD on 08/16/2021 ?Stage prefix: Initial diagnosis ?Method of lymph node assessment: Fine needle aspiration ?Histologic grading system: 3 grade system ? ?  ? Genetic Testing  ? [employer] CancerNext (36 genes) was Negative. Report date is 08/05/2021. ? ?The CancerNext gene panel offered by [employer] includes sequencing, rearrangement analysis, and RNA analysis for the following 36 genes:   APC, ATM, AXIN2, BARD1, BMPR1A, BRCA1, BRCA2, BRIP1, CDH1, CDK4, CDKN2A, CHEK2, DICER1, HOXB13, EPCAM, GREM1, MLH1, MSH2, MSH3, MSH6, MUTYH, NBN, NF1, NTHL1, PALB2, PMS2, POLD1, POLE, PTEN, RAD51C, RAD51D, RECQL, SMAD4, SMARCA4, STK11, and TP53.  ?  ?07/2021 -  Anti-estrogen oral therapy  ? Adjuvant antiestrogen therapy with letrozole started prior to lumpectomy ?  ?08/19/2021 Surgery  ? Right lumpectomy: 5 cm (2 foci contiguous) grade 2 IDC and ILC with DCIS, involves the inferior margin, ALH, 1/3 lymph nodes positive ER 70 to 95%, PR 50 to 90%, HER2 1+ by IHC, Ki-67 1% ?  ?08/30/2021 Cancer Staging  ? Staging form: Breast, AJCC 8th Edition ?- Pathologic: Stage IB (pT3, pN1, cM0, G2, ER+, PR+, HER2-) - Signed by Nicholas Lose, MD on 08/30/2021 ?Stage prefix: Initial diagnosis ?Multigene prognostic tests performed: MammaPrint ?Histologic grading system: 3 grade system ? ?  ?10/07/2021 -  11/16/2021 Radiation Therapy  ? Site Technique Total Dose (Gy) Dose per Fx (Gy) Completed Fx Beam Energies  ?Breast, Right: Breast_R 3D 50/50 2 25/25 10X  ?Breast, Right: Breast_R_PAB_SCV 3D 50/50 2 25/25 6X, 15X  ?Breast, Right: Breast_R_Bst 3D 10/10 2 5/5 6X  ? ?  ? ? ?INTERVAL HISTORY:  ?Tonya Haynes to review her survivorship care plan detailing her treatment course for breast cancer, as well as monitoring long-term side effects of that treatment, education regarding health maintenance, screening, and overall wellness and health promotion.    ? ?Overall, Tonya Haynes reports feeling quite well.  She continues on the letrozole every evening and is tolerating this quite well.  She does note that she has some questions about what she should look for moving forward and how she might know if she has a recurrence. ? ?REVIEW OF SYSTEMS:  ?Review of Systems  ?Constitutional:  Negative for appetite change, chills, fatigue, fever and unexpected weight change.  ?HENT:   Negative for hearing loss, lump/mass and trouble swallowing.   ?Eyes:  Negative for eye problems and icterus.  ?Respiratory:  Negative for chest tightness, cough and shortness of breath.   ?Cardiovascular:  Negative for chest pain, leg swelling and palpitations.  ?Gastrointestinal:  Negative for abdominal distention, abdominal pain, constipation, diarrhea, nausea and vomiting.  ?Endocrine: Negative for hot flashes.  ?Genitourinary:  Negative for difficulty urinating.   ?Musculoskeletal:  Negative for arthralgias.  ?Skin:  Negative for itching and rash.  ?Neurological:  Negative for dizziness, extremity weakness, headaches and numbness.  ?Hematological:  Negative for adenopathy. Does not bruise/bleed easily.  ?Psychiatric/Behavioral:  Negative for depression. The patient  is not nervous/anxious.   ?Breast: Denies any new nodularity, masses, tenderness, nipple changes, or nipple discharge.  ? ? ? ? ?ONCOLOGY TREATMENT TEAM:  ?1. Surgeon:  Dr. Georgette Dover at  [facility] ?2. Medical Oncologist: Dr. Lindi Adie  ?3. Radiation Oncologist: Dr. Isidore Moos ?  ? ?PAST MEDICAL/SURGICAL HISTORY:  ?Past Medical History:  ?Diagnosis Date  ? Anxiety   ? Asthma   ? Breast cancer ([place])   ? Diastolic dysfunction   ? History of basal cell carcinoma   ? Hypertension   ? LBBB (left bundle branch block)   ? Unspecified asthma(493.90)   ?  Problem list entry automatically replaced. Please review for accuracy.  ? ?Past Surgical History:  ?Procedure Laterality Date  ? AXILLARY SENTINEL NODE BIOPSY Right 08/19/2021  ? Procedure: AXILLARY SENTINEL NODE BIOPSY;  Surgeon: Donnie Mesa, MD;  Location: [place];  Service: General;  Laterality: Right;  ? BASAL CELL CARCINOMA EXCISION    ? RIGHT DELTOID MUSCLE  ? BREAST BIOPSY Right 08/11/2021  ? BREAST LUMPECTOMY WITH RADIOACTIVE SEED AND SENTINEL LYMPH NODE BIOPSY Right 08/19/2021  ? Procedure: RIGHT BREAST LUMPECTOMY WITH RADIOACTIVE SEED X2;  Surgeon: Donnie Mesa, MD;  Location: [facility];  Service: General;  Laterality: Right;  [place] 1  ? RADIOACTIVE SEED GUIDED AXILLARY SENTINEL LYMPH NODE Right 08/19/2021  ? Procedure: RADIOACTIVE SEED GUIDED RIGHT  AXILLARY SENTINEL LYMPH NODE DISSECTION;  Surgeon: Donnie Mesa, MD;  Location: [facility];  Service: General;  Laterality: Right;  ? RE-EXCISION OF BREAST LUMPECTOMY Right 09/08/2021  ? Procedure: RE-EXCISION OF INFERIOR MARGIN RIGHT BREAST LUMPECTOMY;  Surgeon: Donnie Mesa, MD;  Location: [place];  Service: General;  Laterality: Right;  ? ? ? ?ALLERGIES:  ?Allergies  ?Allergen Reactions  ? Chlorhexidine Gluconate Rash  ?  Rash with CHG soap and wipes  ? ? ? ?CURRENT MEDICATIONS:  ?Outpatient Encounter Medications as of 01/03/2022  ?Medication Sig  ? acetaminophen (TYLENOL) 500 MG tablet Take 1,000 mg by mouth every 6 (six) hours as needed for moderate pain.  ? fexofenadine (ALLEGRA) 180 MG tablet Take 180 mg by mouth daily as needed for allergies or rhinitis.  ?  hydrochlorothiazide (HYDRODIURIL) 25 MG tablet Take 1 tablet (25 mg total) by mouth daily.  ? letrozole (FEMARA) 2.5 MG tablet Take 1 tablet (2.5 mg total) by mouth daily. (Patient taking differently: Take 2.5 mg by mouth every evening.)  ? lisinopril (ZESTRIL) 10 MG tablet Take 1 tablet (10 mg total) by mouth daily.  ? Multiple Vitamin (MULTI-VITAMIN PO) Take 1 capsule by mouth daily.   ? nebivolol (BYSTOLIC) 10 MG tablet Take 1 tablet (10 mg total) by mouth daily.  ? potassium chloride (KLOR-CON) 10 MEQ tablet Take 1 tablet (10 mEq total) by mouth daily.  ? rosuvastatin (CRESTOR) 10 MG tablet Take 1 tablet (10 mg total) by mouth daily.  ? ?No facility-administered encounter medications on file as of 01/03/2022.  ? ? ? ?ONCOLOGIC FAMILY HISTORY:  ?Family History  ?Problem Relation Age of Onset  ? Breast cancer Mother 78  ? Fibromyalgia Mother   ? Hypertension Father   ? Lung cancer Maternal Uncle   ?     smoked  ? Stomach cancer Paternal Uncle   ?     dx. 98s  ? ? ? ?GENETIC COUNSELING/TESTING: ?See above ? ?SOCIAL HISTORY:  ?Social History  ? ?Socioeconomic History  ? Marital status: Married  ?  Spouse name: Ed  ? Number of children: 3  ?  Years of education: Not on file  ? Highest education level: Not on file  ?Occupational History  ? Not on file  ?Tobacco Use  ? Smoking status: Never  ? Smokeless tobacco: Never  ?Vaping Use  ? Vaping Use: Never used  ?Substance and Sexual Activity  ? Alcohol use: Yes  ?  Comment: Social  ? Drug use: No  ? Sexual activity: Yes  ?Other Topics Concern  ? Not on file  ?Social History Narrative  ? Not on file  ? ?Social Determinants of Health  ? ?Financial Resource Strain: Low Risk   ? Difficulty of Paying Living Expenses: Not very hard  ?Food Insecurity: No Food Insecurity  ? Worried About Charity fundraiser in the Last Year: Never true  ? Ran Out of Food in the Last Year: Never true  ?Transportation Needs: No Transportation Needs  ? Lack of Transportation (Medical): No  ? Lack of  Transportation (Non-Medical): No  ?Physical Activity: Not on file  ?Stress: Not on file  ?Social Connections: Not on file  ?Intimate Partner Violence: Not on file  ? ? ? ?OBSERVATIONS/OBJECTIVE:  ?BP (!) 149/99 (BP Loc

## 2022-01-04 ENCOUNTER — Encounter: Payer: Self-pay | Admitting: Cardiovascular Disease

## 2022-01-04 NOTE — Progress Notes (Signed)
Tonya Haynes ?Date of Birth  Dec 20, 1960 ?[facility] / [facility] ?[address] ?[place], [place]  [address] ?[phone]  Fax  [phone] ? ? ? ?Tonya Haynes is a middle-aged female who I have been seeing for hypertension. She's done very well. Her last echocardiogram shows normal left ventricular systolic function. She did have moderate left ventricular hypertrophy but this has improved slightly and now she only has mild left ventricular hypertrophy. ? ?She is able to do all of her normal activities without any significant problems. ? ?Sept. 2, 2016: ? ?Doing well. ?No cp, no dyspnea. No complaints.  ?Not much exercise.  Planning on starting soon. ?Has been check ing her BP  - readings are all normal  ?125 / 72  ? ?She is very anxious today . ? ?December 12, 2016: ? ?Tonya Haynes is seen today for follow up visit  ?BP at home is good.   ? ?Last echo in 2016 shows normal LV function .  ?Is planning on walking some  ? ?Feb 12, 2018: ? ?Tonya Haynes is seen today for follow up of her HTN,LBBB and CHF ?Doing well .    ?BP has been good .   No cp , breathing is good .  ? ?Sept. 28, 2020  ?Tonya Haynes is seen for follow up of her HTN , LBBB  ?Grade 1 diastolic CHF ?Works is going ok  ?Does not exercise much  ?Does not like to sweat.  ? ?Dec. 17, 2020 ? ?Tonya Haynes is seen today for follow up visit. ?Is taking Bystolic 10 mg a day , ?Has not started the HCTZ and KCl . ?Is walking some - walks 45 min. 3 days a week.    ?No CP or dyspnea.  ? ?We will start the hydrochlorothiazide and potassium today.  She will continue to monitor blood pressure readings.  We will check a basic metabolic profile in 3 weeks and I will see her again in 3 months. ? ?December 23, 2019:  ?Tonya Haynes is seen today for follow-up visit for hypertension, left bundle branch block.  We added HCTZ and potassium during her last office visit. ?Is walking . ?Feels great.   No CP  ? ? ?Oct. 3, 2022 ?Tonya Haynes is seen today for follow up of her HTN  and LBBB ?Her last echo was ih Oct. 2016 -  normal LV systolic function - EF 70-26%, grade 1 DD ?Mild MR  ?Is walking some  ?We discussed regular exercise ? ?January 05, 2022 ?Tonya Haynes is seen today for follow up of her HTN, LBBB, HLD  ?She was diagnosed with R breast cancer since I last saw her ?She has done very well since I last saw her from an exercise, diet standpoint ?Wt today is 182 lbs. ( Down 30 lbs from previous visit )  ? ?Walking  - does not particulally enjoy it  ? ?Her home blood pressure readings look great. ? ?Current Outpatient Medications on File Prior to Visit  ?Medication Sig Dispense Refill  ? acetaminophen (TYLENOL) 500 MG tablet Take 1,000 mg by mouth every 6 (six) hours as needed for moderate pain.    ? fexofenadine (ALLEGRA) 180 MG tablet Take 180 mg by mouth daily as needed for allergies or rhinitis.    ? letrozole (FEMARA) 2.5 MG tablet Take 1 tablet (2.5 mg total) by mouth daily. 90 tablet 3  ? lisinopril (ZESTRIL) 10 MG tablet Take 1 tablet (10 mg total) by mouth daily. 90 tablet  1  ? Multiple Vitamin (MULTI-VITAMIN PO) Take 1 capsule by mouth daily.     ? nebivolol (BYSTOLIC) 10 MG tablet Take 1 tablet (10 mg total) by mouth daily. 90 tablet 2  ? potassium chloride (KLOR-CON) 10 MEQ tablet Take 1 tablet (10 mEq total) by mouth daily. 90 tablet 1  ? rosuvastatin (CRESTOR) 10 MG tablet Take 1 tablet (10 mg total) by mouth daily. 90 tablet 1  ? Triamcinolone Acetonide (NASACORT ALLERGY 24HR NA) Place 1 spray into the nose daily as needed.    ? ?No current facility-administered medications on file prior to visit.  ? ? ?Allergies  ?Allergen Reactions  ? Chlorhexidine Gluconate Rash  ?  Rash with CHG soap and wipes  ? ? ?Past Medical History:  ?Diagnosis Date  ? Anxiety   ? Asthma   ? Breast cancer ([place])   ? Diastolic dysfunction   ? History of basal cell carcinoma   ? Hypertension   ? LBBB (left bundle branch block)   ? Unspecified asthma(493.90)   ?  Problem list entry automatically replaced.  Please review for accuracy.  ? ? ?Past Surgical History:  ?Procedure Laterality Date  ? AXILLARY SENTINEL NODE BIOPSY Right 08/19/2021  ? Procedure: AXILLARY SENTINEL NODE BIOPSY;  Surgeon: Donnie Mesa, MD;  Location: [place];  Service: General;  Laterality: Right;  ? BASAL CELL CARCINOMA EXCISION    ? RIGHT DELTOID MUSCLE  ? BREAST BIOPSY Right 08/11/2021  ? BREAST LUMPECTOMY WITH RADIOACTIVE SEED AND SENTINEL LYMPH NODE BIOPSY Right 08/19/2021  ? Procedure: RIGHT BREAST LUMPECTOMY WITH RADIOACTIVE SEED X2;  Surgeon: Donnie Mesa, MD;  Location: [place];  Service: General;  Laterality: Right;  [place] 1  ? RADIOACTIVE SEED GUIDED AXILLARY SENTINEL LYMPH NODE Right 08/19/2021  ? Procedure: RADIOACTIVE SEED GUIDED RIGHT  AXILLARY SENTINEL LYMPH NODE DISSECTION;  Surgeon: Donnie Mesa, MD;  Location: [place];  Service: General;  Laterality: Right;  ? RE-EXCISION OF BREAST LUMPECTOMY Right 09/08/2021  ? Procedure: RE-EXCISION OF INFERIOR MARGIN RIGHT BREAST LUMPECTOMY;  Surgeon: Donnie Mesa, MD;  Location: [facility];  Service: General;  Laterality: Right;  ? ? ?Social History  ? ?Tobacco Use  ?Smoking Status Never  ?Smokeless Tobacco Never  ? ? ?Social History  ? ?Substance and Sexual Activity  ?Alcohol Use Yes  ? Comment: Social  ? ? ?Family History  ?Problem Relation Age of Onset  ? Breast cancer Mother 13  ? Fibromyalgia Mother   ? Hypertension Father   ? Lung cancer Maternal Uncle   ?     smoked  ? Stomach cancer Paternal Uncle   ?     dx. 67s  ? ? ?Reviw of Systems:  ?Reviewed in the HPI.  All other systems are negative. ? ? ?Physical Exam: ?Blood pressure (!) 146/88, pulse 97, height '5\' 8"'$  (1.727 m), weight 182 lb (82.6 kg), last menstrual period 04/23/2011, SpO2 98 %. ? ?GEN:  Well nourished, well developed in no acute distress ?HEENT: Normal ?NECK: No JVD; No carotid bruits ?LYMPHATICS: No lymphadenopathy ?CARDIAC: RRR , no murmur heard today ?Breast :  she has a discolored  area on  the lateral aspect of her R breast.   Seems to be fading at the edges.  Non tender.  No mass / no thickness .   Likely some residual iron deposition from the marking protocol.  She thinks it is gradually fading.   ?RESPIRATORY:  Clear to auscultation without rales, wheezing or rhonchi  ?  ABDOMEN: Soft, non-tender, non-distended ?MUSCULOSKELETAL:  No edema; No deformity  ?SKIN: Warm and dry ?NEUROLOGIC:  Alert and oriented x 3 ? ?EKG:     ? ?Assessment / Plan:  ? ?1. Hypertension: Blood pressures have been great.  We will reduce her HCTZ to 12.5 mg a day.  She is hoping to come off the HCTZ completely.  We will see her in 3 months for follow-up visit. ?  ? ?2. Left bundle branch block: Has a chronic left bundle branch block.  Continue Bystolic and lisinopril for now.  As she loses weight and her blood pressure comes down, will likely be able to reduce the dose of 1 or both of these medications. ?   ? ?3.  Hyperlipidemia:   ?Her lipid levels look great.  Continue current medications.  We will plan on rechecking them again in 6 months.  We will consider getting a coronary calcium score in 3 months. ? ?4.  Breast cancer:   she is doing well .  Has had 2 lumpectomies.  Finished with her XRT.  Has a medium sized area of discoloration along the lateral aspect of her R breast.   She thinks it is gradually fading / dissipating.   It is likely due to some residual iron staining form the lymphnode marking procedure done prior to lumpectomy .  ? ? ?3 month follow u p ? ? ? ?Mertie Moores, MD  ?01/05/2022 6:15 PM    ?[place] ?[address],  [address] ?[place],   [address] ?Pager [phone] ?Phone: [phone]; Fax: [phone]  ? ? ?

## 2022-01-05 ENCOUNTER — Encounter: Payer: Self-pay | Admitting: Cardiovascular Disease

## 2022-01-05 ENCOUNTER — Ambulatory Visit (INDEPENDENT_AMBULATORY_CARE_PROVIDER_SITE_OTHER): Payer: BC Managed Care – PPO | Admitting: Cardiovascular Disease

## 2022-01-05 VITALS — BP 146/88 | HR 97 | Ht 68.0 in | Wt 182.0 lb

## 2022-01-05 DIAGNOSIS — I10 Essential (primary) hypertension: Secondary | ICD-10-CM | POA: Diagnosis not present

## 2022-01-05 DIAGNOSIS — I447 Left bundle-branch block, unspecified: Secondary | ICD-10-CM | POA: Diagnosis not present

## 2022-01-05 MED ORDER — HYDROCHLOROTHIAZIDE 12.5 MG PO CAPS: 12.5000 mg | ORAL_CAPSULE | Freq: Every day | ORAL | Status: DC

## 2022-01-05 NOTE — Patient Instructions (Signed)
Medication Instructions:  ?Your physician has recommended you make the following change in your medication:  ? ?1) DECREASE Hydrochlorothiazide 12.'5mg'$  daily ? ?*If you need a refill on your cardiac medications before your next appointment, please call your pharmacy* ? ?Lab Work: ?NONE ?If you have labs (blood work) drawn today and your tests are completely normal, you will receive your results only by: ?MyChart Message (if you have MyChart) OR ?A paper copy in the mail ?If you have any lab test that is abnormal or we need to change your treatment, we will call you to review the results. ? ?Testing/Procedures: ?NONE ? ?Follow-Up: ?At [facility], you and your health needs are our priority.  As part of our continuing mission to provide you with exceptional heart care, we have created designated Provider Care Teams.  These Care Teams include your primary Cardiologist (physician) and Advanced Practice Providers (APPs -  Physician Assistants and Nurse Practitioners) who all work together to provide you with the care you need, when you need it. ? ?Your next appointment:   ?3 month(s) ? ?The format for your next appointment:   ?In Person ? ?Provider:   ?Mertie Moores, MD ?

## 2022-01-13 ENCOUNTER — Telehealth: Payer: Self-pay | Admitting: *Deleted

## 2022-01-13 DIAGNOSIS — E7800 Pure hypercholesterolemia, unspecified: Secondary | ICD-10-CM

## 2022-01-13 DIAGNOSIS — E785 Hyperlipidemia, unspecified: Secondary | ICD-10-CM

## 2022-01-13 DIAGNOSIS — Z79899 Other long term (current) drug therapy: Secondary | ICD-10-CM

## 2022-01-13 DIAGNOSIS — E782 Mixed hyperlipidemia: Secondary | ICD-10-CM

## 2022-01-13 MED ORDER — ROSUVASTATIN CALCIUM 10 MG PO TABS: 5.0000 mg | ORAL_TABLET | Freq: Every day | ORAL | Status: DC

## 2022-01-13 NOTE — Telephone Encounter (Signed)
Pt awrae of recommendations and agrees with plan ./cy ?

## 2022-01-13 NOTE — Telephone Encounter (Signed)
-----   Message from Thayer Headings, MD sent at 01/12/2022  6:10 PM EDT ----- ?Tonya Haynes is doing well on Rosuvastatin 10 mg a day  ?LDL is 61 ?She is continuing to exercise, lose weight . ?We will reduce her rosuvastatin to 5 mg a day  ?Lets check fasting lipids, ALT a day or so before her next office visit in ~3 months  ? ?Thanks ? ?PN ? ? ? ?

## 2022-01-13 NOTE — Telephone Encounter (Signed)
-----   Message from Thayer Headings, MD sent at 01/12/2022  6:10 PM EDT ----- ?[name] is doing well on Rosuvastatin 10 mg a day  ?LDL is 61 ?She is continuing to exercise, lose weight . ?We will reduce her rosuvastatin to 5 mg a day  ?Lets check fasting lipids, ALT a day or so before her next office visit in ~3 months  ? ?Thanks ? ?PN ? ? ? ?

## 2022-01-14 ENCOUNTER — Encounter: Payer: Self-pay | Admitting: Adult Health

## 2022-01-21 ENCOUNTER — Encounter: Payer: Self-pay | Admitting: Adult Health

## 2022-02-14 ENCOUNTER — Encounter: Payer: BC Managed Care – PPO | Admitting: Adult Health

## 2022-02-14 ENCOUNTER — Encounter: Payer: Self-pay | Admitting: Adult Health

## 2022-02-21 ENCOUNTER — Ambulatory Visit: Payer: BC Managed Care – PPO | Attending: Surgery

## 2022-02-21 VITALS — Wt 182.0 lb

## 2022-02-21 DIAGNOSIS — Z483 Aftercare following surgery for neoplasm: Secondary | ICD-10-CM | POA: Insufficient documentation

## 2022-02-21 NOTE — Therapy (Signed)
OUTPATIENT PHYSICAL THERAPY SOZO SCREENING NOTE   Patient Name: Tonya Haynes MRN: [id number] DOB:January 08, 1961, 61 y.o., female Today's Date: 02/21/2022  PCP: Harlan Stains, MD REFERRING PROVIDER: Harlan Stains, MD   PT End of Session - 02/21/22 [id number]     Visit Number 3   # unchanged due to screen only   PT Start Time 0950    PT Stop Time 0954    PT Time Calculation (min) 4 min    Activity Tolerance Patient tolerated treatment well    Behavior During Therapy WFL for tasks assessed/performed             Past Medical History:  Diagnosis Date   Anxiety    Asthma    Breast cancer ([place])    Diastolic dysfunction    History of basal cell carcinoma    Hypertension    LBBB (left bundle branch block)    Unspecified asthma(493.90)     Problem list entry automatically replaced. Please review for accuracy.   Past Surgical History:  Procedure Laterality Date   AXILLARY SENTINEL NODE BIOPSY Right 08/19/2021   Procedure: AXILLARY SENTINEL NODE BIOPSY;  Surgeon: Donnie Mesa, MD;  Location: [facility];  Service: General;  Laterality: Right;   BASAL CELL CARCINOMA EXCISION     RIGHT DELTOID MUSCLE   BREAST BIOPSY Right 08/11/2021   BREAST LUMPECTOMY WITH RADIOACTIVE SEED AND SENTINEL LYMPH NODE BIOPSY Right 08/19/2021   Procedure: RIGHT BREAST LUMPECTOMY WITH RADIOACTIVE SEED X2;  Surgeon: Donnie Mesa, MD;  Location: [facility];  Service: General;  Laterality: Right;  120 MINUTES ROOM 1   RADIOACTIVE SEED GUIDED AXILLARY SENTINEL LYMPH NODE Right 08/19/2021   Procedure: RADIOACTIVE SEED GUIDED RIGHT  AXILLARY SENTINEL LYMPH NODE DISSECTION;  Surgeon: Donnie Mesa, MD;  Location: [facility];  Service: General;  Laterality: Right;   RE-EXCISION OF BREAST LUMPECTOMY Right 09/08/2021   Procedure: RE-EXCISION OF INFERIOR MARGIN RIGHT BREAST LUMPECTOMY;  Surgeon: Donnie Mesa, MD;  Location: [facility];  Service: General;  Laterality: Right;   Patient Active Problem List    Diagnosis Date Noted   Genetic testing 08/06/2021   Family history of breast cancer 07/29/2021   Malignant neoplasm of upper-outer quadrant of right breast in female, estrogen receptor positive ([place]) 07/23/2021   Essential hypertension, malignant 12/12/2016   Hypertension    LBBB (left bundle branch block)    Anxiety    Unspecified HCWCBJ(628.31)    Diastolic dysfunction    History of basal cell carcinoma     REFERRING DIAG: right breast cancer at risk for lymphedema  THERAPY DIAG:  Aftercare following surgery for neoplasm  PERTINENT HISTORY: Patient was diagnosed on 06/18/2021 with right grade I invasive ductal carcinoma breast cancer. It measures 7 mm and is located in the upper outer quadrant. It is ER/PR positive and HER2 negative with a Ki67 of 1%. She has a biopsied positive axillary lymph node. Rt lumpectomy 08/19/21 with 1/3 LN positive.  Radiation completed 11/16/21. Antiestrogen therapy.   PRECAUTIONS: right UE Lymphedema risk, None  SUBJECTIVE: Pt returns for her 3 month L-Dex screen.   PAIN:  Are you having pain? No  SOZO SCREENING: Patient was assessed today using the SOZO machine to determine the lymphedema index score. This was compared to her baseline score. It was determined that she is within the recommended range when compared to her baseline and no further action is needed at this time. She will continue SOZO screenings. These are done every 3 months for 2 years  post operatively followed by every 6 months for 2 years, and then annually.    Otelia Limes, PTA 02/21/2022, 9:55 AM

## 2022-03-13 ENCOUNTER — Other Ambulatory Visit: Payer: Self-pay | Admitting: Cardiovascular Disease

## 2022-03-13 DIAGNOSIS — I10 Essential (primary) hypertension: Secondary | ICD-10-CM

## 2022-03-13 DIAGNOSIS — I5189 Other ill-defined heart diseases: Secondary | ICD-10-CM

## 2022-03-14 ENCOUNTER — Other Ambulatory Visit: Payer: Self-pay

## 2022-03-14 MED ORDER — HYDROCHLOROTHIAZIDE 12.5 MG PO CAPS: 12.5000 mg | ORAL_CAPSULE | Freq: Every day | ORAL | 3 refills | Status: DC

## 2022-03-23 ENCOUNTER — Encounter: Payer: Self-pay | Admitting: Adult Health

## 2022-03-28 ENCOUNTER — Other Ambulatory Visit: Payer: BC Managed Care – PPO

## 2022-03-28 DIAGNOSIS — E785 Hyperlipidemia, unspecified: Secondary | ICD-10-CM

## 2022-03-28 LAB — LIPID PANEL
Chol/HDL Ratio: 2.3 ratio (ref 0.0–4.4)
Cholesterol, Total: 130 mg/dL (ref 100–199)
HDL: 56 mg/dL (ref >39)
LDL Chol Calc (NIH): 58 mg/dL (ref 0–99)
Triglycerides: 84 mg/dL (ref 0–149)
VLDL Cholesterol Cal: 16 mg/dL (ref 5–40)

## 2022-03-28 LAB — ALT: ALT: 28 IU/L (ref 0–32)

## 2022-03-30 ENCOUNTER — Encounter: Payer: Self-pay | Admitting: Cardiovascular Disease

## 2022-03-30 NOTE — Progress Notes (Unsigned)
Tonya Haynes Date of Birth  [birth year] [facility] / [facility] [address] [place], [place]  [address] [phone]  Fax  [phone]    Tonya Haynes is a middle-aged female who I have been seeing for hypertension. She's done very well. Her last echocardiogram shows normal left ventricular systolic function. She did have moderate left ventricular hypertrophy but this has improved slightly and now she only has mild left ventricular hypertrophy.  She is able to do all of her normal activities without any significant problems.  Sept. 2, 2016:  Doing well. No cp, no dyspnea. No complaints.  Not much exercise.  Planning on starting soon. Has been check ing her BP  - readings are all normal  125 / 72   She is very anxious today.  December 12, 2016:  Tonya Haynes is seen today for follow up visit  BP at home is good.    Last echo in 2016 shows normal LV function .  Is planning on walking some   Walking regularly ,   Lipids look great   Feb 12, 2018:  Tonya Haynes is seen today for follow up of her HTN,LBBB and CHF Doing well .    BP has been good .   No cp , breathing is good .   Sept. 28, 2020  Tonya Haynes is seen for follow up of her HTN , LBBB  Grade 1 diastolic CHF Works is going ok  Does not exercise much  Does not like to sweat.   Dec. 17, 2020  Tonya Haynes is seen today for follow up visit. Is taking Bystolic 10 mg a day , Has not started the HCTZ and KCl . Is walking some - walks 45 min. 3 days a week.    No CP or dyspnea.   We will start the hydrochlorothiazide and potassium today.  She will continue to monitor blood pressure readings.  We will check a basic metabolic profile in 3 weeks and I will see her again in 3 months.  December 23, 2019:  Tonya Haynes is seen today for follow-up visit for hypertension, left bundle branch block.  We added HCTZ and potassium during her last office visit. Is walking . Feels great.   No CP    Oct. 3,  2022 Tonya Haynes is seen today for follow up of her HTN and LBBB Her last echo was ih Oct. 2016 -  normal LV systolic function - EF 08-14%, grade 1 DD Mild MR  Is walking some  We discussed regular exercise  January 05, 2022 Tonya Haynes is seen today for follow up of her HTN, LBBB, HLD  She was diagnosed with R breast cancer since I last saw her She has done very well since I last saw her from an exercise, diet standpoint Wt today is 182 lbs. ( Down 30 lbs from previous visit )   Walking  - does not particulally enjoy it   Her home blood pressure readings look great.   March 31, 2022 Tonya Haynes is seen today for follow up visit Recent lipids look great She has been walking regularly  Wt. Is 178  ( down 4 lbs from last visit   The mag trace is fading slowly.    Has some tenderness along the R side of her R breast .  Not to the point that it is worrisome.     Is enjoying walking now. Looks forward to walking now .   She had some questions  about letrozole ( Femara)  Apparently it can cause some cholesterol elevations in post menapausal women.  We discussed the fact that she is getting significant benefit from the Femara and that any slight increased of cholesterol at this time would likely not be significant      Current Outpatient Medications on File Prior to Visit  Medication Sig Dispense Refill   acetaminophen (TYLENOL) 500 MG tablet Take 1,000 mg by mouth every 6 (six) hours as needed for moderate pain.     fexofenadine (ALLEGRA) 180 MG tablet Take 180 mg by mouth daily as needed for allergies or rhinitis.     hydrochlorothiazide (MICROZIDE) 12.5 MG capsule Take 1 capsule (12.5 mg total) by mouth daily. 90 capsule 3   letrozole (FEMARA) 2.5 MG tablet Take 1 tablet (2.5 mg total) by mouth daily. 90 tablet 3   lisinopril (ZESTRIL) 10 MG tablet TAKE 1 TABLET(10 MG) BY MOUTH DAILY 90 tablet 3   Multiple Minerals-Vitamins (CITRACAL MAXIMUM PLUS) TABS '650mg'$  Calcium, 1000 IU Vitamin D daily      Multiple Vitamin (MULTI-VITAMIN PO) Take 1 capsule by mouth daily.      nebivolol (BYSTOLIC) 10 MG tablet Take 1 tablet (10 mg total) by mouth daily. 90 tablet 2   potassium chloride (KLOR-CON) 10 MEQ tablet TAKE 1 TABLET(10 MEQ) BY MOUTH DAILY 90 tablet 3   rosuvastatin (CRESTOR) 10 MG tablet Take 0.5 tablets (5 mg total) by mouth daily.     Triamcinolone Acetonide (NASACORT ALLERGY 24HR NA) Place 1 spray into the nose daily as needed.     No current facility-administered medications on file prior to visit.    Allergies  Allergen Reactions   Chlorhexidine Gluconate Rash    Rash with CHG soap and wipes    Past Medical History:  Diagnosis Date   Anxiety    Asthma    Breast cancer ([place])    Diastolic dysfunction    History of basal cell carcinoma    Hypertension    LBBB (left bundle branch block)    Unspecified asthma(493.90)     Problem list entry automatically replaced. Please review for accuracy.    Past Surgical History:  Procedure Laterality Date   AXILLARY SENTINEL NODE BIOPSY Right 08/19/2021   Procedure: AXILLARY SENTINEL NODE BIOPSY;  Surgeon: Donnie Mesa, MD;  Location: [facility] [facility];  Service: General;  Laterality: Right;   BASAL CELL CARCINOMA EXCISION     RIGHT DELTOID MUSCLE   BREAST BIOPSY Right 08/11/2021   BREAST LUMPECTOMY WITH RADIOACTIVE SEED AND SENTINEL LYMPH NODE BIOPSY Right 08/19/2021   Procedure: RIGHT BREAST LUMPECTOMY WITH RADIOACTIVE SEED X2;  Surgeon: Donnie Mesa, MD;  Location: [facility];  Service: General;  Laterality: Right;  120 MINUTES ROOM 1   RADIOACTIVE SEED GUIDED AXILLARY SENTINEL LYMPH NODE Right 08/19/2021   Procedure: RADIOACTIVE SEED GUIDED RIGHT  AXILLARY SENTINEL LYMPH NODE DISSECTION;  Surgeon: Donnie Mesa, MD;  Location: [facility];  Service: General;  Laterality: Right;   RE-EXCISION OF BREAST LUMPECTOMY Right 09/08/2021   Procedure: RE-EXCISION OF INFERIOR MARGIN RIGHT BREAST LUMPECTOMY;  Surgeon: Donnie Mesa, MD;  Location: [facility];  Service: General;  Laterality: Right;    Social History   Tobacco Use  Smoking Status Never  Smokeless Tobacco Never    Social History   Substance and Sexual Activity  Alcohol Use Yes   Comment: Social    Family History  Problem Relation Age of Onset   Breast cancer Mother 71   Fibromyalgia  Mother    Hypertension Father    Lung cancer Maternal Uncle        smoked   Stomach cancer Paternal Uncle        dx. 21s    Reviw of Systems:  Reviewed in the HPI.  All other systems are negative.  Physical Exam: Blood pressure (!) 153/95, pulse 92, height '5\' 8"'$  (1.727 m), weight 178 lb 9.6 oz (81 kg), last menstrual period 04/23/2011, SpO2 97 %.  GEN:  Well nourished, well developed in no acute distress HEENT: Normal NECK: No JVD; No carotid bruits LYMPHATICS: No lymphadenopathy CARDIAC: RRR , no murmurs heard today , rubs, gallops RESPIRATORY:  Clear to auscultation without rales, wheezing or rhonchi  R breast:   the iron oxide discoloration appears to be gradually shrinking .  Slowly fading .  Is mildly  tender but not to the point of needing any medications . ABDOMEN: Soft, non-tender, non-distended MUSCULOSKELETAL:  No edema; No deformity  SKIN: Warm and dry NEUROLOGIC:  Alert and oriented x 3   EKG:      Assessment / Plan:   1. Hypertension:  Bp is well controlled.     2. Left bundle branch block: stable      3.  Hyperlipidemia:   Lipids look great   4.  Breast cancer:   she is doing well .  Has had 2 lumpectomies.  Finished with her XRT.  Has a medium sized area of discoloration along the lateral aspect of her R breast.  The is due to staining from the iron oxide tracing study ( Magtrace)   the discoloration appearts to be gradually fading / dissipating.      Tonya Haynes has taken periodic measurements and by her calculations, the discoloration should resolve in ~ 30 months .    From my reading, the iron oxide discoloration has been reported to last  for months - years.   Will see how soon this resolves.    Will see her in  6 months .     Mertie Moores, MD  03/31/2022 8:10 AM    [address] [place],  [place], [place]  [address] Pager [phone] Phone: [phone]; Fax: [phone]

## 2022-03-31 ENCOUNTER — Ambulatory Visit (INDEPENDENT_AMBULATORY_CARE_PROVIDER_SITE_OTHER): Payer: BC Managed Care – PPO | Admitting: Cardiovascular Disease

## 2022-03-31 ENCOUNTER — Encounter: Payer: Self-pay | Admitting: Cardiovascular Disease

## 2022-03-31 VITALS — BP 153/95 | HR 92 | Ht 68.0 in | Wt 178.6 lb

## 2022-03-31 DIAGNOSIS — E782 Mixed hyperlipidemia: Secondary | ICD-10-CM | POA: Diagnosis not present

## 2022-03-31 DIAGNOSIS — I447 Left bundle-branch block, unspecified: Secondary | ICD-10-CM

## 2022-03-31 NOTE — Patient Instructions (Signed)
Medication Instructions:  Your physician recommends that you continue on your current medications as directed. Please refer to the Current Medication list given to you today.  *If you need a refill on your cardiac medications before your next appointment, please call your pharmacy*   Lab Work: NONE If you have labs (blood work) drawn today and your tests are completely normal, you will receive your results only by: Tonya Haynes (if you have MyChart) OR A paper copy in the mail If you have any lab test that is abnormal or we need to change your treatment, we will call you to review the results.   Testing/Procedures: EKG (at 6 month f/u appt)   Follow-Up: At [facility], you and your health needs are our priority.  As part of our continuing mission to provide you with exceptional heart care, we have created designated Provider Care Teams.  These Care Teams include your primary Cardiologist (physician) and Advanced Practice Providers (APPs -  Physician Assistants and Nurse Practitioners) who all work together to provide you with the care you need, when you need it.  Your next appointment:   6 month(s)  The format for your next appointment:   In Person  Provider:   Mertie Moores, MD    Important Information About Sugar

## 2022-03-31 NOTE — Telephone Encounter (Signed)
June 29.  Mag trace staining

## 2022-05-23 ENCOUNTER — Encounter: Payer: Self-pay | Admitting: Physical Therapy

## 2022-05-23 ENCOUNTER — Ambulatory Visit: Payer: BC Managed Care – PPO | Attending: Hematology and Oncology

## 2022-05-23 ENCOUNTER — Other Ambulatory Visit: Payer: Self-pay | Admitting: *Deleted

## 2022-05-23 VITALS — Wt 177.1 lb

## 2022-05-23 DIAGNOSIS — C50411 Malignant neoplasm of upper-outer quadrant of right female breast: Secondary | ICD-10-CM

## 2022-05-23 DIAGNOSIS — Z483 Aftercare following surgery for neoplasm: Secondary | ICD-10-CM | POA: Insufficient documentation

## 2022-05-23 NOTE — Therapy (Addendum)
OUTPATIENT PHYSICAL THERAPY SOZO SCREENING NOTE   Patient Name: Tonya Haynes MRN: [id number] DOB:Jul 28, 1961, 61 y.o., female Today's Date: 05/23/2022  PCP: Harlan Stains, MD REFERRING PROVIDER: Nicholas Lose, MD   PT End of Session - 05/23/22 0920     Visit Number 3   # unchanged due to screen only   PT Start Time 0917    PT Stop Time 0922    PT Time Calculation (min) 5 min    Activity Tolerance Patient tolerated treatment well    Behavior During Therapy WFL for tasks assessed/performed             Past Medical History:  Diagnosis Date   Anxiety    Asthma    Breast cancer ([place])    Diastolic dysfunction    History of basal cell carcinoma    Hypertension    LBBB (left bundle branch block)    Unspecified asthma(493.90)     Problem list entry automatically replaced. Please review for accuracy.   Past Surgical History:  Procedure Laterality Date   AXILLARY SENTINEL NODE BIOPSY Right 08/19/2021   Procedure: AXILLARY SENTINEL NODE BIOPSY;  Surgeon: Donnie Mesa, MD;  Location: [facility];  Service: General;  Laterality: Right;   BASAL CELL CARCINOMA EXCISION     RIGHT DELTOID MUSCLE   BREAST BIOPSY Right 08/11/2021   BREAST LUMPECTOMY WITH RADIOACTIVE SEED AND SENTINEL LYMPH NODE BIOPSY Right 08/19/2021   Procedure: RIGHT BREAST LUMPECTOMY WITH RADIOACTIVE SEED X2;  Surgeon: Donnie Mesa, MD;  Location: [facility];  Service: General;  Laterality: Right;  120 MINUTES ROOM 1   RADIOACTIVE SEED GUIDED AXILLARY SENTINEL LYMPH NODE Right 08/19/2021   Procedure: RADIOACTIVE SEED GUIDED RIGHT  AXILLARY SENTINEL LYMPH NODE DISSECTION;  Surgeon: Donnie Mesa, MD;  Location: [facility];  Service: General;  Laterality: Right;   RE-EXCISION OF BREAST LUMPECTOMY Right 09/08/2021   Procedure: RE-EXCISION OF INFERIOR MARGIN RIGHT BREAST LUMPECTOMY;  Surgeon: Donnie Mesa, MD;  Location: [facility];  Service: General;  Laterality: Right;   Patient Active Problem List    Diagnosis Date Noted   Genetic testing 08/06/2021   Family history of breast cancer 07/29/2021   Malignant neoplasm of upper-outer quadrant of right breast in female, estrogen receptor positive ([place]) 07/23/2021   Essential hypertension, malignant 12/12/2016   Hypertension    LBBB (left bundle branch block)    Anxiety    Unspecified UKGURK(270.62)    Diastolic dysfunction    History of basal cell carcinoma     REFERRING DIAG: right breast cancer at risk for lymphedema  THERAPY DIAG: Aftercare following surgery for neoplasm  PERTINENT HISTORY: Patient was diagnosed on 06/18/2021 with right grade I invasive ductal carcinoma breast cancer. It measures 7 mm and is located in the upper outer quadrant. It is ER/PR positive and HER2 negative with a Ki67 of 1%. She has a biopsied positive axillary lymph node. Rt lumpectomy 08/19/21 with 1/3 LN positive.  Radiation completed 11/16/21. Antiestrogen therapy.   PRECAUTIONS: right UE Lymphedema risk, None  SUBJECTIVE: Pt returns for her 3 month L-Dex screen.   PAIN:  Are you having pain? No  SOZO SCREENING: Patient was assessed today using the SOZO machine to determine the lymphedema index score. This was compared to her baseline score. It was determined that she is within the recommended range when compared to her baseline and no further action is needed at this time. She will continue SOZO screenings. These are done every 3 months for 2 years post  operatively followed by every 6 months for 2 years, and then annually.   L-DEX FLOWSHEETS - 05/23/22 0900       L-DEX LYMPHEDEMA SCREENING   Measurement Type Unilateral    L-DEX MEASUREMENT EXTREMITY Upper Extremity    POSITION  Standing    DOMINANT SIDE Right    At Risk Side Right    BASELINE SCORE (UNILATERAL) -2.6    L-DEX SCORE (UNILATERAL) 1.2    VALUE CHANGE (UNILAT) 3.8              Otelia Limes, PTA 05/23/2022, 9:24 AM

## 2022-05-24 ENCOUNTER — Other Ambulatory Visit: Payer: Self-pay

## 2022-05-24 ENCOUNTER — Ambulatory Visit: Payer: BC Managed Care – PPO | Attending: Hematology and Oncology | Admitting: Rehabilitation

## 2022-05-24 ENCOUNTER — Encounter: Payer: Self-pay | Admitting: Rehabilitation

## 2022-05-24 DIAGNOSIS — C50411 Malignant neoplasm of upper-outer quadrant of right female breast: Secondary | ICD-10-CM | POA: Diagnosis present

## 2022-05-24 DIAGNOSIS — R293 Abnormal posture: Secondary | ICD-10-CM | POA: Insufficient documentation

## 2022-05-24 DIAGNOSIS — Z170 Estrogen receptor positive status [ER+]: Secondary | ICD-10-CM | POA: Diagnosis present

## 2022-05-24 DIAGNOSIS — R600 Localized edema: Secondary | ICD-10-CM | POA: Insufficient documentation

## 2022-05-24 DIAGNOSIS — Z483 Aftercare following surgery for neoplasm: Secondary | ICD-10-CM | POA: Insufficient documentation

## 2022-05-24 NOTE — Therapy (Signed)
OUTPATIENT PHYSICAL THERAPY ONCOLOGY EVALUATION  Patient Name: Tonya Haynes MRN: [id number] DOB:1961/08/28, 61 y.o., female Today's Date: 05/24/2022   PT End of Session - 05/24/22 1611     Visit Number 4    Number of Visits 10    Date for PT Re-Evaluation 07/05/22    PT Start Time 1505    PT Stop Time 1600    PT Time Calculation (min) 55 min    Activity Tolerance Patient tolerated treatment well    Behavior During Therapy WFL for tasks assessed/performed             Past Medical History:  Diagnosis Date   Anxiety    Asthma    Breast cancer ([place])    Diastolic dysfunction    History of basal cell carcinoma    Hypertension    LBBB (left bundle branch block)    Unspecified asthma(493.90)     Problem list entry automatically replaced. Please review for accuracy.   Past Surgical History:  Procedure Laterality Date   AXILLARY SENTINEL NODE BIOPSY Right 08/19/2021   Procedure: AXILLARY SENTINEL NODE BIOPSY;  Surgeon: Donnie Mesa, MD;  Location: [place];  Service: General;  Laterality: Right;   BASAL CELL CARCINOMA EXCISION     RIGHT DELTOID MUSCLE   BREAST BIOPSY Right 08/11/2021   BREAST LUMPECTOMY WITH RADIOACTIVE SEED AND SENTINEL LYMPH NODE BIOPSY Right 08/19/2021   Procedure: RIGHT BREAST LUMPECTOMY WITH RADIOACTIVE SEED X2;  Surgeon: Donnie Mesa, MD;  Location: [place];  Service: General;  Laterality: Right;  120 MINUTES ROOM 1   RADIOACTIVE SEED GUIDED AXILLARY SENTINEL LYMPH NODE Right 08/19/2021   Procedure: RADIOACTIVE SEED GUIDED RIGHT  AXILLARY SENTINEL LYMPH NODE DISSECTION;  Surgeon: Donnie Mesa, MD;  Location: [place];  Service: General;  Laterality: Right;   RE-EXCISION OF BREAST LUMPECTOMY Right 09/08/2021   Procedure: RE-EXCISION OF INFERIOR MARGIN RIGHT BREAST LUMPECTOMY;  Surgeon: Donnie Mesa, MD;  Location: [place];  Service: General;  Laterality: Right;   Patient Active Problem List   Diagnosis Date Noted   Genetic testing  08/06/2021   Family history of breast cancer 07/29/2021   Malignant neoplasm of upper-outer quadrant of right breast in female, estrogen receptor positive ([place]) 07/23/2021   Essential hypertension, malignant 12/12/2016   Hypertension    LBBB (left bundle branch block)    Anxiety    Unspecified JJOACZ(660.63)    Diastolic dysfunction    History of basal cell carcinoma     PCP: Harlan Stains , MD  REFERRING PROVIDER: Dr. Nicholas Lose  REFERRING DIAG: right breast cancer      THERAPY DIAG:  Aftercare following surgery for neoplasm  Malignant neoplasm of upper-outer quadrant of right breast in female, estrogen receptor positive ([place])  Abnormal posture  Localized edema  ONSET DATE: 08/19/21   Rationale for Evaluation and Treatment Rehabilitation  SUBJECTIVE  SUBJECTIVE STATEMENT: I need to know if my pain is okay.  I can have shooting pain in the morning and after a lot of activity.  I also have a dull ache in the breast and they hurt when it bounces.  I have been using weights and yoga,   PERTINENT HISTORY:  Patient was diagnosed on 06/18/2021 with right grade I invasive ductal carcinoma breast cancer. It measures 7 mm and is located in the upper outer quadrant. It is ER/PR positive and HER2 negative with a Ki67 of 1%. She has a biopsied positive axillary lymph node. Rt lumpectomy 08/19/21 with 1/3 LN positive.  Radiation completed 11/16/21. Antiestrogen therapy.   PAIN:  Are you having pain? Yes NPRS scale: 3 /10 Pain location: Rt breast  Pain orientation: Right  PAIN TYPE: aching Pain description: intermittent  Aggravating factors: pushing on it, if my dog jumps on it Relieving factors: bra, foam  PRECAUTIONS: lymphedema risk Rt UE  WEIGHT BEARING RESTRICTIONS No  FALLS:  Has patient  fallen in last 6 months? No  LIVING ENVIRONMENT: Lives with: lives with their family and lives with their spouse Lives in: House/apartment  OCCUPATION: full time Therapist, music   LEISURE: walks, yoga, light weights  HAND DOMINANCE : right   PRIOR LEVEL OF FUNCTION: Independent  PATIENT GOALS learn about pains and try to decrease them   OBJECTIVE  COGNITION:  Overall cognitive status: Within functional limits for tasks assessed   PALPATION: No breast fibrosis noted, more of a soft edema, tightness pectoralis, cording noted in axilla 2 cords to mid upper arm  OBSERVATIONS / OTHER ASSESSMENTS: enlarged pores, imprint from gauze and bra on breast, wearing gauze for rawness on Rt nipple.   POSTURE: WNL  UPPER EXTREMITY AROM/PROM:  A/PROM RIGHT   11/23/21  05/24/22  Shoulder extension    Shoulder flexion 165 168  Shoulder abduction 165 168  Shoulder internal rotation    Shoulder external rotation      (Blank rows = not tested)  CERVICAL AROM: All within normal limits:   Breast edema questionnaire: 24/80  TODAY'S TREATMENT  Date: 05/24/22 STM Rt pectoralis borders in arm out to the side and arm overhead position - noted cording in this position with education on this for pt throughout  PATIENT EDUCATION:  Education details: cording, POC, breast edema Person educated: Patient Education method: Explanation Education comprehension: verbalized understanding   HOME EXERCISE PROGRAM:   ASSESSMENT:  CLINICAL IMPRESSION: Patient is a 61 y.o. female who was seen today for physical therapy evaluation and treatment for continued pain 6 months post radiation for the Rt breast.  She has Rt axillary and upper arm cording which may be causing some of the intermiitent arm pain and axillary pain.  She also has general breast edema with enlarged pores, mag tracer very noticeable despite being injected in November, and marks from her bra.  Congestion noted in the axilla as  well.  Pt has excellent AROM and is not limited by much except she feels stiffness with stretching activities in yoga and some breast pain.  Pt is agreeable to STM, stretching, and MLD to decrease pain.   OBJECTIVE IMPAIRMENTS increased edema and pain.   ACTIVITY LIMITATIONS none  PARTICIPATION LIMITATIONS: community activity  PERSONAL FACTORS 1-2 comorbidities: SLNB, radiation history  are also affecting patient's functional outcome.   REHAB POTENTIAL: Excellent  CLINICAL DECISION MAKING: Evolving/moderate complexity  EVALUATION COMPLEXITY: Moderate  GOALS: Goals reviewed with patient? Yes   LONG TERM GOALS:  Target date: 07/05/2022    Pt will decrease breast complaints questionnaire to 14 or less Baseline:  Goal status: INITIAL  2.  Pt will report no pain in the breast with bouncing activities Baseline:  Goal status: INITIAL  3.  Pt will report no arm pain in the morning Baseline:  Goal status: INITIAL  4.  Pt will be ind with self MLD for the breast Baseline:  Goal status: INITIAL   PLAN: PT FREQUENCY: 1x/week due to work schedule  PT DURATION: 6 weeks  PLANNED INTERVENTIONS: Therapeutic exercises, Patient/Family education, Self Care, Manual therapy, and Re-evaluation  PLAN FOR NEXT SESSION: Rt pectoralis/cording work, start Rt breast and upper arm MLD with education as able   Stark Bray, PT 05/24/2022, 4:13 PM

## 2022-06-01 ENCOUNTER — Ambulatory Visit: Payer: BC Managed Care – PPO | Admitting: Rehabilitation

## 2022-06-01 ENCOUNTER — Encounter: Payer: Self-pay | Admitting: Rehabilitation

## 2022-06-01 DIAGNOSIS — R293 Abnormal posture: Secondary | ICD-10-CM

## 2022-06-01 DIAGNOSIS — R600 Localized edema: Secondary | ICD-10-CM

## 2022-06-01 DIAGNOSIS — Z483 Aftercare following surgery for neoplasm: Secondary | ICD-10-CM

## 2022-06-01 DIAGNOSIS — C50411 Malignant neoplasm of upper-outer quadrant of right female breast: Secondary | ICD-10-CM

## 2022-06-01 NOTE — Therapy (Signed)
OUTPATIENT PHYSICAL THERAPY ONCOLOGY TREATMENT  Patient Name: Tonya Haynes MRN: [id number] DOB:Mar 19, 1961, 60 y.o., female Today's Date: 06/01/2022   PT End of Session - 06/01/22 1601     Visit Number 5    Number of Visits 10    Date for PT Re-Evaluation 07/05/22    PT Start Time 1400    PT Stop Time 1454    PT Time Calculation (min) 54 min    Activity Tolerance Patient tolerated treatment well    Behavior During Therapy WFL for tasks assessed/performed              Past Medical History:  Diagnosis Date   Anxiety    Asthma    Breast cancer ([place])    Diastolic dysfunction    History of basal cell carcinoma    Hypertension    LBBB (left bundle branch block)    Unspecified asthma(493.90)     Problem list entry automatically replaced. Please review for accuracy.   Past Surgical History:  Procedure Laterality Date   AXILLARY SENTINEL NODE BIOPSY Right 08/19/2021   Procedure: AXILLARY SENTINEL NODE BIOPSY;  Surgeon: Donnie Mesa, MD;  Location: [place];  Service: General;  Laterality: Right;   BASAL CELL CARCINOMA EXCISION     RIGHT DELTOID MUSCLE   BREAST BIOPSY Right 08/11/2021   BREAST LUMPECTOMY WITH RADIOACTIVE SEED AND SENTINEL LYMPH NODE BIOPSY Right 08/19/2021   Procedure: RIGHT BREAST LUMPECTOMY WITH RADIOACTIVE SEED X2;  Surgeon: Donnie Mesa, MD;  Location: [place];  Service: General;  Laterality: Right;  120 MINUTES ROOM 1   RADIOACTIVE SEED GUIDED AXILLARY SENTINEL LYMPH NODE Right 08/19/2021   Procedure: RADIOACTIVE SEED GUIDED RIGHT  AXILLARY SENTINEL LYMPH NODE DISSECTION;  Surgeon: Donnie Mesa, MD;  Location: [place];  Service: General;  Laterality: Right;   RE-EXCISION OF BREAST LUMPECTOMY Right 09/08/2021   Procedure: RE-EXCISION OF INFERIOR MARGIN RIGHT BREAST LUMPECTOMY;  Surgeon: Donnie Mesa, MD;  Location: [place];  Service: General;  Laterality: Right;   Patient Active Problem List   Diagnosis Date Noted   Genetic  testing 08/06/2021   Family history of breast cancer 07/29/2021   Malignant neoplasm of upper-outer quadrant of right breast in female, estrogen receptor positive ([place]) 07/23/2021   Essential hypertension, malignant 12/12/2016   Hypertension    LBBB (left bundle branch block)    Anxiety    Unspecified WTKTCC(883.37)    Diastolic dysfunction    History of basal cell carcinoma     PCP: Harlan Stains , MD  REFERRING PROVIDER: Dr. Nicholas Lose  REFERRING DIAG: right breast cancer      THERAPY DIAG:  Aftercare following surgery for neoplasm  Malignant neoplasm of upper-outer quadrant of right breast in female, estrogen receptor positive ([place])  Abnormal posture  Localized edema  ONSET DATE: 08/19/21   Rationale for Evaluation and Treatment Rehabilitation  SUBJECTIVE  SUBJECTIVE STATEMENT: I am doing around the same   PERTINENT HISTORY:  Patient was diagnosed on 06/18/2021 with right grade I invasive ductal carcinoma breast cancer. It measures 7 mm and is located in the upper outer quadrant. It is ER/PR positive and HER2 negative with a Ki67 of 1%. She has a biopsied positive axillary lymph node. Rt lumpectomy 08/19/21 with 1/3 LN positive.  Radiation completed 11/16/21. Antiestrogen therapy.   PAIN:  Are you having pain? Just some aching  NPRS scale: 1/10 Pain location: Rt breast  Pain orientation: Right  PAIN TYPE: aching Pain description: intermittent  Aggravating factors: pushing on it, if my dog jumps on it Relieving factors: bra, foam  PRECAUTIONS: lymphedema risk Rt UE  OCCUPATION: full time Therapist, music   LEISURE: walks, yoga, light weights  PATIENT GOALS learn about pains and try to decrease them   OBJECTIVE PALPATION: No breast fibrosis noted, more of a soft edema,  tightness pectoralis, cording noted in axilla 2 cords to mid upper arm  OBSERVATIONS / OTHER ASSESSMENTS: enlarged pores, imprint from gauze and bra on breast, wearing gauze for rawness on Rt nipple.   POSTURE: WNL  UPPER EXTREMITY AROM/PROM:  A/PROM RIGHT   11/23/21  05/24/22  Shoulder extension    Shoulder flexion 165 168  Shoulder abduction 165 168  Shoulder internal rotation    Shoulder external rotation      (Blank rows = not tested)  Breast edema questionnaire: 24/80  TODAY'S TREATMENT  This procedure has been fully reviewed with the patient and written informed consent has been obtained.  Date: 06/01/22 Pulleys x 31min into flexion and abduction (easy) STM Rt pectoralis borders in arm out to the side and arm overhead position and cording release from axilla into upper arm as tolerated.   MLD with focus on the Rt axilla and breast with instruction on self performance: In supine: Short neck, superficial and deep abdominals, bil axilla and Rt inguinal nodes, anterior inter-axillary and Lt axillo-inguinal anastomosis, and Rt breast focusing on areas of swelling redirecting towards anastomosis. - Gave handout. Vcs, tcs, and hand over hand education  Date: 05/24/22 STM Rt pectoralis borders in arm out to the side and arm overhead position - noted cording in this position with education on this for pt throughout  PATIENT EDUCATION:  Education details: cording, POC, breast edema Person educated: Patient Education method: Explanation Education comprehension: verbalized understanding   HOME EXERCISE PROGRAM: Self MLD, doorway stretch  ASSESSMENT: CLINICAL IMPRESSION: Pt had decreased cording evident in the axilla post MT.  Started MLD to the Rt breast and axilla with mild reduction noted.  Pt notes improvements post treatment.     GOALS: Goals reviewed with patient? Yes   LONG TERM GOALS: Target date: 07/05/2022    Pt will decrease breast complaints questionnaire to 14 or  less Baseline:  Goal status: INITIAL  2.  Pt will report no pain in the breast with bouncing activities Baseline:  Goal status: INITIAL  3.  Pt will report no arm pain in the morning Baseline:  Goal status: INITIAL  4.  Pt will be ind with self MLD for the breast Baseline:  Goal status: INITIAL   PLAN: PT FREQUENCY: 1x/week due to work schedule  PT DURATION: 6 weeks  PLANNED INTERVENTIONS: Therapeutic exercises, Patient/Family education, Self Care, Manual therapy, and Re-evaluation  PLAN FOR NEXT SESSION: Rt pectoralis/cording work, start Rt breast and upper arm MLD with education as able   Stark Bray, PT 06/01/2022,  4:02 PM

## 2022-06-08 ENCOUNTER — Other Ambulatory Visit: Payer: Self-pay | Admitting: Cardiovascular Disease

## 2022-06-08 ENCOUNTER — Encounter: Payer: Self-pay | Admitting: Cardiovascular Disease

## 2022-06-08 ENCOUNTER — Other Ambulatory Visit: Payer: Self-pay

## 2022-06-08 ENCOUNTER — Ambulatory Visit: Payer: BC Managed Care – PPO | Attending: Hematology and Oncology | Admitting: Rehabilitation

## 2022-06-08 ENCOUNTER — Other Ambulatory Visit: Payer: Self-pay | Admitting: Hematology and Oncology

## 2022-06-08 DIAGNOSIS — I10 Essential (primary) hypertension: Secondary | ICD-10-CM

## 2022-06-08 DIAGNOSIS — R293 Abnormal posture: Secondary | ICD-10-CM | POA: Insufficient documentation

## 2022-06-08 DIAGNOSIS — Z170 Estrogen receptor positive status [ER+]: Secondary | ICD-10-CM | POA: Diagnosis present

## 2022-06-08 DIAGNOSIS — E785 Hyperlipidemia, unspecified: Secondary | ICD-10-CM

## 2022-06-08 DIAGNOSIS — Z79899 Other long term (current) drug therapy: Secondary | ICD-10-CM

## 2022-06-08 DIAGNOSIS — E782 Mixed hyperlipidemia: Secondary | ICD-10-CM

## 2022-06-08 DIAGNOSIS — R600 Localized edema: Secondary | ICD-10-CM | POA: Diagnosis present

## 2022-06-08 DIAGNOSIS — E7800 Pure hypercholesterolemia, unspecified: Secondary | ICD-10-CM

## 2022-06-08 DIAGNOSIS — C50411 Malignant neoplasm of upper-outer quadrant of right female breast: Secondary | ICD-10-CM | POA: Diagnosis present

## 2022-06-08 DIAGNOSIS — Z483 Aftercare following surgery for neoplasm: Secondary | ICD-10-CM | POA: Insufficient documentation

## 2022-06-08 DIAGNOSIS — I5189 Other ill-defined heart diseases: Secondary | ICD-10-CM

## 2022-06-08 MED ORDER — NEBIVOLOL HCL 10 MG PO TABS: 10.0000 mg | ORAL_TABLET | Freq: Every day | ORAL | 2 refills | Status: DC

## 2022-06-08 MED ORDER — ROSUVASTATIN CALCIUM 5 MG PO TABS: 5.0000 mg | ORAL_TABLET | Freq: Every day | ORAL | 3 refills | Status: DC

## 2022-06-08 NOTE — Therapy (Signed)
OUTPATIENT PHYSICAL THERAPY ONCOLOGY TREATMENT  Patient Name: Tonya Haynes MRN: [id number] DOB:[birth year], 61 y.o., female Today's Date: 06/08/2022   PT End of Session - 06/08/22 0809     Visit Number 6    Number of Visits 10    PT Start Time 0915    PT Stop Time 1000    PT Time Calculation (min) 45 min    Activity Tolerance Patient tolerated treatment well    Behavior During Therapy WFL for tasks assessed/performed              Past Medical History:  Diagnosis Date   Anxiety    Asthma    Breast cancer ([place])    Diastolic dysfunction    History of basal cell carcinoma    Hypertension    LBBB (left bundle branch block)    Unspecified asthma(493.90)     Problem list entry automatically replaced. Please review for accuracy.   Past Surgical History:  Procedure Laterality Date   AXILLARY SENTINEL NODE BIOPSY Right 08/19/2021   Procedure: AXILLARY SENTINEL NODE BIOPSY;  Surgeon: Donnie Mesa, MD;  Location: [place];  Service: General;  Laterality: Right;   BASAL CELL CARCINOMA EXCISION     RIGHT DELTOID MUSCLE   BREAST BIOPSY Right 08/11/2021   BREAST LUMPECTOMY WITH RADIOACTIVE SEED AND SENTINEL LYMPH NODE BIOPSY Right 08/19/2021   Procedure: RIGHT BREAST LUMPECTOMY WITH RADIOACTIVE SEED X2;  Surgeon: Donnie Mesa, MD;  Location: [place];  Service: General;  Laterality: Right;  120 MINUTES ROOM 1   RADIOACTIVE SEED GUIDED AXILLARY SENTINEL LYMPH NODE Right 08/19/2021   Procedure: RADIOACTIVE SEED GUIDED RIGHT  AXILLARY SENTINEL LYMPH NODE DISSECTION;  Surgeon: Donnie Mesa, MD;  Location: [place];  Service: General;  Laterality: Right;   RE-EXCISION OF BREAST LUMPECTOMY Right 09/08/2021   Procedure: RE-EXCISION OF INFERIOR MARGIN RIGHT BREAST LUMPECTOMY;  Surgeon: Donnie Mesa, MD;  Location: [place];  Service: General;  Laterality: Right;   Patient Active Problem List   Diagnosis Date Noted   Genetic testing 08/06/2021   Family history of breast  cancer 07/29/2021   Malignant neoplasm of upper-outer quadrant of right breast in female, estrogen receptor positive ([place]) 07/23/2021   Essential hypertension, malignant 12/12/2016   Hypertension    LBBB (left bundle branch block)    Anxiety    Unspecified IOMBTD(974.16)    Diastolic dysfunction    History of basal cell carcinoma     PCP: Harlan Stains , MD  REFERRING PROVIDER: Dr. Nicholas Lose  REFERRING DIAG: right breast cancer      THERAPY DIAG:  Aftercare following surgery for neoplasm  ONSET DATE: 08/19/21   Rationale for Evaluation and Treatment Rehabilitation  SUBJECTIVE  SUBJECTIVE STATEMENT: I believe the pain is less. Last night it felt a bit tight reaching into a jar I get every night and that was new.     PERTINENT HISTORY:  Patient was diagnosed on 06/18/2021 with right grade I invasive ductal carcinoma breast cancer. It measures 7 mm and is located in the upper outer quadrant. It is ER/PR positive and HER2 negative with a Ki67 of 1%. She has a biopsied positive axillary lymph node. Rt lumpectomy 08/19/21 with 1/3 LN positive.  Radiation completed 11/16/21. Antiestrogen therapy.   PAIN:  Are you having pain? No NPRS scale: up to 1/10 Pain location: Rt breast  Pain orientation: Right  PAIN TYPE: aching Pain description: intermittent  Aggravating factors: pushing on it, if my dog jumps on it Relieving factors: bra, foam  PRECAUTIONS: lymphedema risk Rt UE  OCCUPATION: full time Therapist, music   LEISURE: walks, yoga, light weights  PATIENT GOALS learn about pains and try to decrease them   OBJECTIVE PALPATION: No breast fibrosis noted, more of a soft edema, tightness pectoralis, cording noted in axilla 2 cords to mid upper arm  OBSERVATIONS / OTHER ASSESSMENTS:  enlarged pores, imprint from gauze and bra on breast, wearing gauze for rawness on Rt nipple.   POSTURE: WNL  UPPER EXTREMITY AROM/PROM:  A/PROM RIGHT   11/23/21  05/24/22  Shoulder extension    Shoulder flexion 165 168  Shoulder abduction 165 168  Shoulder internal rotation    Shoulder external rotation      (Blank rows = not tested)  Breast edema questionnaire: 24/80  TODAY'S TREATMENT  Pt permission and consent throughout each step of examination and treatment with modification and draping if requested when working on sensitive areas  Date: 06/08/22  Reviewed doorway stretch and added self opposing traction for cording Answered pt questions regarding self exercises Performed MLD review seated in front of the mirror with review of all steps and modification as needed STM Rt pectoralis borders in arm out to the side and arm overhead position and cording release from axilla into upper arm as tolerated.   MLD with focus on the Rt axilla and breast with instruction on self performance: In supine: Short neck, superficial and deep abdominals, bil axilla and Rt inguinal nodes, anterior inter-axillary and Lt axillo-inguinal anastomosis, and Rt breast focusing on areas of swelling redirecting towards anastomosis.   Date: 06/01/22 Pulleys x 46mn into flexion and abduction (easy) STM Rt pectoralis borders in arm out to the side and arm overhead position and cording release from axilla into upper arm as tolerated.   MLD with focus on the Rt axilla and breast with instruction on self performance: In supine: Short neck, superficial and deep abdominals, bil axilla and Rt inguinal nodes, anterior inter-axillary and Lt axillo-inguinal anastomosis, and Rt breast focusing on areas of swelling redirecting towards anastomosis. - Gave handout. Vcs, tcs, and hand over hand education  Date: 05/24/22 STM Rt pectoralis borders in arm out to the side and arm overhead position - noted cording in this position with  education on this for pt throughout  PATIENT EDUCATION:  Education details: cording, POC, breast edema Person educated: Patient Education method: Explanation Education comprehension: verbalized understanding   HOME EXERCISE PROGRAM: Self MLD, doorway stretch  ASSESSMENT: CLINICAL IMPRESSION: Pt is doing very well and seeing some improvement.  Continued cording and breast edema treatment.    GOALS: Goals reviewed with patient? Yes   LONG TERM GOALS: Target date: 07/05/2022    Pt  will decrease breast complaints questionnaire to 14 or less Baseline:  Goal status: INITIAL  2.  Pt will report no pain in the breast with bouncing activities Baseline:  Goal status: INITIAL  3.  Pt will report no arm pain in the morning Baseline:  Goal status: INITIAL  4.  Pt will be ind with self MLD for the breast Baseline:  Goal status: INITIAL   PLAN: PT FREQUENCY: 1x/week due to work schedule  PT DURATION: 6 weeks  PLANNED INTERVENTIONS: Therapeutic exercises, Patient/Family education, Self Care, Manual therapy, and Re-evaluation  PLAN FOR NEXT SESSION: Rt pectoralis/cording work, start Rt breast and upper arm MLD with education as able   Stark Bray, PT 06/08/2022, 10:43 AM

## 2022-06-08 NOTE — Telephone Encounter (Signed)
Last OV on 03/31/22:  3.  Hyperlipidemia:   Lipids look great    rosuvastatin (CRESTOR) 10 MG tablet Take 0.5 tablets (5 mg total) by mouth daily.   Sent rx in for '5mg'$  tablets per pt request to avoid cutting pills.

## 2022-06-15 ENCOUNTER — Ambulatory Visit: Payer: BC Managed Care – PPO | Admitting: Rehabilitation

## 2022-06-15 ENCOUNTER — Encounter: Payer: Self-pay | Admitting: Rehabilitation

## 2022-06-15 DIAGNOSIS — R293 Abnormal posture: Secondary | ICD-10-CM

## 2022-06-15 DIAGNOSIS — C50411 Malignant neoplasm of upper-outer quadrant of right female breast: Secondary | ICD-10-CM

## 2022-06-15 DIAGNOSIS — Z483 Aftercare following surgery for neoplasm: Secondary | ICD-10-CM | POA: Diagnosis not present

## 2022-06-15 DIAGNOSIS — R600 Localized edema: Secondary | ICD-10-CM

## 2022-06-15 NOTE — Therapy (Signed)
OUTPATIENT PHYSICAL THERAPY ONCOLOGY TREATMENT  Patient Name: Tonya Haynes MRN: [id number] DOB:October 07, 1960, 61 y.o., female Today's Date: 06/15/2022   PT End of Session - 06/15/22 1359     Visit Number 7    Number of Visits 10    Date for PT Re-Evaluation 07/05/22    PT Start Time 6160    PT Stop Time 1453    PT Time Calculation (min) 48 min    Activity Tolerance Patient tolerated treatment well    Behavior During Therapy WFL for tasks assessed/performed              Past Medical History:  Diagnosis Date   Anxiety    Asthma    Breast cancer ([place])    Diastolic dysfunction    History of basal cell carcinoma    Hypertension    LBBB (left bundle branch block)    Unspecified asthma(493.90)     Problem list entry automatically replaced. Please review for accuracy.   Past Surgical History:  Procedure Laterality Date   AXILLARY SENTINEL NODE BIOPSY Right 08/19/2021   Procedure: AXILLARY SENTINEL NODE BIOPSY;  Surgeon: Donnie Mesa, MD;  Location: [facility];  Service: General;  Laterality: Right;   BASAL CELL CARCINOMA EXCISION     RIGHT DELTOID MUSCLE   BREAST BIOPSY Right 08/11/2021   BREAST LUMPECTOMY WITH RADIOACTIVE SEED AND SENTINEL LYMPH NODE BIOPSY Right 08/19/2021   Procedure: RIGHT BREAST LUMPECTOMY WITH RADIOACTIVE SEED X2;  Surgeon: Donnie Mesa, MD;  Location: [place];  Service: General;  Laterality: Right;  120 MINUTES ROOM 1   RADIOACTIVE SEED GUIDED AXILLARY SENTINEL LYMPH NODE Right 08/19/2021   Procedure: RADIOACTIVE SEED GUIDED RIGHT  AXILLARY SENTINEL LYMPH NODE DISSECTION;  Surgeon: Donnie Mesa, MD;  Location: [facility];  Service: General;  Laterality: Right;   RE-EXCISION OF BREAST LUMPECTOMY Right 09/08/2021   Procedure: RE-EXCISION OF INFERIOR MARGIN RIGHT BREAST LUMPECTOMY;  Surgeon: Donnie Mesa, MD;  Location: [place];  Service: General;  Laterality: Right;   Patient Active Problem List   Diagnosis Date Noted   Genetic  testing 08/06/2021   Family history of breast cancer 07/29/2021   Malignant neoplasm of upper-outer quadrant of right breast in female, estrogen receptor positive ([place]) 07/23/2021   Essential hypertension, malignant 12/12/2016   Hypertension    LBBB (left bundle branch block)    Anxiety    Unspecified VPXTGG(269.48)    Diastolic dysfunction    History of basal cell carcinoma     PCP: Harlan Stains , MD  REFERRING PROVIDER: Dr. Nicholas Lose  REFERRING DIAG: right breast cancer      THERAPY DIAG:  Aftercare following surgery for neoplasm  Malignant neoplasm of upper-outer quadrant of right breast in female, estrogen receptor positive ([place])  Abnormal posture  Localized edema  ONSET DATE: 08/19/21   Rationale for Evaluation and Treatment Rehabilitation  SUBJECTIVE  SUBJECTIVE STATEMENT: The dull aching pain in my breast is so much better.  The shooting pain in the arm has also lessened.  Much smaller area.  I am trying the compression.    PERTINENT HISTORY:  Patient was diagnosed on 06/18/2021 with right grade I invasive ductal carcinoma breast cancer. It measures 7 mm and is located in the upper outer quadrant. It is ER/PR positive and HER2 negative with a Ki67 of 1%. She has a biopsied positive axillary lymph node. Rt lumpectomy 08/19/21 with 1/3 LN positive.  Radiation completed 11/16/21. Antiestrogen therapy.   PAIN:  Are you having pain? No NPRS scale: up to 1/10 Pain location: Rt breast  Pain orientation: Right  PAIN TYPE: aching Pain description: intermittent  Aggravating factors: pushing on it, if my dog jumps on it Relieving factors: bra, foam  PRECAUTIONS: lymphedema risk Rt UE  OCCUPATION: full time Therapist, music   LEISURE: walks, yoga, light weights  PATIENT GOALS  learn about pains and try to decrease them   OBJECTIVE PALPATION: No breast fibrosis noted, more of a soft edema, tightness pectoralis, cording noted in axilla 2 cords to mid upper arm  OBSERVATIONS / OTHER ASSESSMENTS: enlarged pores, imprint from gauze and bra on breast, wearing gauze for rawness on Rt nipple.   POSTURE: WNL  UPPER EXTREMITY AROM/PROM:  A/PROM RIGHT   11/23/21  05/24/22 06/15/22  Shoulder extension     Shoulder flexion 165 168 168 - cording evident   Shoulder abduction 165 168 168 - cording evident   Shoulder internal rotation     Shoulder external rotation       (Blank rows = not tested)  Breast edema questionnaire: 24/80  TODAY'S TREATMENT  Pt permission and consent throughout each step of examination and treatment with modification and draping if requested when working on sensitive areas  Date: 06/15/22  STM Rt pectoralis borders in arm out to the side and arm overhead position and cording release from axilla into upper arm as tolerated.   MLD with focus on the Rt axilla and breast with instruction on self performance: In supine: Short neck, superficial and deep abdominals, bil axilla and Rt inguinal nodes, anterior inter-axillary and Lt axillo-inguinal anastomosis, and Rt breast focusing on areas of swelling redirecting towards anastomosis.  Reviewed doorway and standing median nerve stretch with pinning and taught pt open book stretch in sidelying  Date: 06/08/22  Reviewed doorway stretch and added self opposing traction for cording Answered pt questions regarding self exercises Performed MLD review seated in front of the mirror with review of all steps and modification as needed STM Rt pectoralis borders in arm out to the side and arm overhead position and cording release from axilla into upper arm as tolerated.   MLD with focus on the Rt axilla and breast with instruction on self performance: In supine: Short neck, superficial and deep abdominals, bil axilla  and Rt inguinal nodes, anterior inter-axillary and Lt axillo-inguinal anastomosis, and Rt breast focusing on areas of swelling redirecting towards anastomosis.   Date: 06/01/22 Pulleys x 83mn into flexion and abduction (easy) STM Rt pectoralis borders in arm out to the side and arm overhead position and cording release from axilla into upper arm as tolerated.   MLD with focus on the Rt axilla and breast with instruction on self performance: In supine: Short neck, superficial and deep abdominals, bil axilla and Rt inguinal nodes, anterior inter-axillary and Lt axillo-inguinal anastomosis, and Rt breast focusing on areas of swelling redirecting  towards anastomosis. - Gave handout. Vcs, tcs, and hand over hand education  Date: 05/24/22 STM Rt pectoralis borders in arm out to the side and arm overhead position - noted cording in this position with education on this for pt throughout  PATIENT EDUCATION:  Education details: cording, POC, breast edema Person educated: Patient Education method: Explanation Education comprehension: verbalized understanding   HOME EXERCISE PROGRAM: Self MLD, doorway stretch  ASSESSMENT: CLINICAL IMPRESSION: Pt is doing very well and seeing some improvement.  Axilla is less puffy so cording is more pronounced 2-3 cords.     GOALS: Goals reviewed with patient? Yes   LONG TERM GOALS: Target date: 07/05/2022    Pt will decrease breast complaints questionnaire to 14 or less Baseline:  Goal status: INITIAL  2.  Pt will report no pain in the breast with bouncing activities Baseline:  Goal status: INITIAL  3.  Pt will report no arm pain in the morning Baseline:  Goal status: INITIAL  4.  Pt will be ind with self MLD for the breast Baseline:  Goal status: INITIAL   PLAN: PT FREQUENCY: 1x/week due to work schedule  PT DURATION: 6 weeks  PLANNED INTERVENTIONS: Therapeutic exercises, Patient/Family education, Self Care, Manual therapy, and  Re-evaluation  PLAN FOR NEXT SESSION: Rt pectoralis/cording work, start Rt breast and upper arm MLD with education as able   Stark Bray, PT 06/15/2022, 2:55 PM

## 2022-06-22 ENCOUNTER — Ambulatory Visit: Payer: BC Managed Care – PPO | Admitting: Rehabilitation

## 2022-06-22 ENCOUNTER — Encounter: Payer: Self-pay | Admitting: Rehabilitation

## 2022-06-22 DIAGNOSIS — Z483 Aftercare following surgery for neoplasm: Secondary | ICD-10-CM | POA: Diagnosis not present

## 2022-06-22 DIAGNOSIS — R600 Localized edema: Secondary | ICD-10-CM

## 2022-06-22 DIAGNOSIS — R293 Abnormal posture: Secondary | ICD-10-CM

## 2022-06-22 DIAGNOSIS — Z170 Estrogen receptor positive status [ER+]: Secondary | ICD-10-CM

## 2022-06-22 NOTE — Therapy (Signed)
OUTPATIENT PHYSICAL THERAPY ONCOLOGY TREATMENT  Patient Name: Tonya Haynes MRN: [id number] DOB:1961-08-21, 61 y.o., female Today's Date: 06/22/2022   PT End of Session - 06/22/22 0800     Visit Number 8    Number of Visits 10    Date for PT Re-Evaluation 07/05/22    PT Start Time 0800    PT Stop Time 0845    PT Time Calculation (min) 45 min    Activity Tolerance Patient tolerated treatment well    Behavior During Therapy WFL for tasks assessed/performed              Past Medical History:  Diagnosis Date   Anxiety    Asthma    Breast cancer ([place])    Diastolic dysfunction    History of basal cell carcinoma    Hypertension    LBBB (left bundle branch block)    Unspecified asthma(493.90)     Problem list entry automatically replaced. Please review for accuracy.   Past Surgical History:  Procedure Laterality Date   AXILLARY SENTINEL NODE BIOPSY Right 08/19/2021   Procedure: AXILLARY SENTINEL NODE BIOPSY;  Surgeon: Donnie Mesa, MD;  Location: [facility];  Service: General;  Laterality: Right;   BASAL CELL CARCINOMA EXCISION     RIGHT DELTOID MUSCLE   BREAST BIOPSY Right 08/11/2021   BREAST LUMPECTOMY WITH RADIOACTIVE SEED AND SENTINEL LYMPH NODE BIOPSY Right 08/19/2021   Procedure: RIGHT BREAST LUMPECTOMY WITH RADIOACTIVE SEED X2;  Surgeon: Donnie Mesa, MD;  Location: [facility];  Service: General;  Laterality: Right;  120 MINUTES ROOM 1   RADIOACTIVE SEED GUIDED AXILLARY SENTINEL LYMPH NODE Right 08/19/2021   Procedure: RADIOACTIVE SEED GUIDED RIGHT  AXILLARY SENTINEL LYMPH NODE DISSECTION;  Surgeon: Donnie Mesa, MD;  Location: [facility];  Service: General;  Laterality: Right;   RE-EXCISION OF BREAST LUMPECTOMY Right 09/08/2021   Procedure: RE-EXCISION OF INFERIOR MARGIN RIGHT BREAST LUMPECTOMY;  Surgeon: Donnie Mesa, MD;  Location: [facility] [place];  Service: General;  Laterality: Right;   Patient Active Problem List   Diagnosis Date Noted   Genetic  testing 08/06/2021   Family history of breast cancer 07/29/2021   Malignant neoplasm of upper-outer quadrant of right breast in female, estrogen receptor positive ([place]) 07/23/2021   Essential hypertension, malignant 12/12/2016   Hypertension    LBBB (left bundle branch block)    Anxiety    Unspecified RKVTXL(217.47)    Diastolic dysfunction    History of basal cell carcinoma     PCP: Harlan Stains , MD  REFERRING PROVIDER: Dr. Nicholas Lose  REFERRING DIAG: right breast cancer      THERAPY DIAG:  Aftercare following surgery for neoplasm  Malignant neoplasm of upper-outer quadrant of right breast in female, estrogen receptor positive ([place])  Abnormal posture  Localized edema  ONSET DATE: 08/19/21   Rationale for Evaluation and Treatment Rehabilitation  SUBJECTIVE  SUBJECTIVE STATEMENT: I am now having very little pain.  I am having less breast pain.  I have the sleeve but I'm not sure if it helps. The cording is still there.  I got a smaller hugger.    PERTINENT HISTORY:  Patient was diagnosed on 06/18/2021 with right grade I invasive ductal carcinoma breast cancer. It measures 7 mm and is located in the upper outer quadrant. It is ER/PR positive and HER2 negative with a Ki67 of 1%. She has a biopsied positive axillary lymph node. Rt lumpectomy 08/19/21 with 1/3 LN positive.  Radiation completed 11/16/21. Antiestrogen therapy.   PAIN:  Are you having pain? No NPRS scale: up to 1/10 Pain location: Rt breast  Pain orientation: Right  PAIN TYPE: aching Pain description: intermittent  Aggravating factors: pushing on it, if my dog jumps on it Relieving factors: bra, foam  PRECAUTIONS: lymphedema risk Rt UE  OCCUPATION: full time Therapist, music   LEISURE: walks, yoga, light  weights  PATIENT GOALS learn about pains and try to decrease them   OBJECTIVE PALPATION: No breast fibrosis noted, more of a soft edema, tightness pectoralis, cording noted in axilla 2 cords to mid upper arm  OBSERVATIONS / OTHER ASSESSMENTS: enlarged pores, imprint from gauze and bra on breast, wearing gauze for rawness on Rt nipple.   POSTURE: WNL  UPPER EXTREMITY AROM/PROM:  A/PROM RIGHT   11/23/21  05/24/22 06/15/22  Shoulder extension     Shoulder flexion 165 168 168 - cording evident   Shoulder abduction 165 168 168 - cording evident   Shoulder internal rotation     Shoulder external rotation       (Blank rows = not tested)  Breast edema questionnaire: 24/80  TODAY'S TREATMENT  Pt permission and consent throughout each step of examination and treatment with modification and draping if requested when working on sensitive areas  Date: 06/22/22  STM Rt pectoralis borders in arm out to the side and arm overhead position and cording release from axilla into upper arm as tolerated.   MLD with focus on the Rt axilla and breast with instruction on self performance: In supine: Short neck, superficial and deep abdominals, bil axilla and Rt inguinal nodes, anterior inter-axillary and Lt axillo-inguinal anastomosis, and Rt breast focusing on areas of swelling redirecting towards anastomosis.  Reviewed open book stretch in sidelying with switch to more of a Y position  Date: 06/15/22  STM Rt pectoralis borders in arm out to the side and arm overhead position and cording release from axilla into upper arm as tolerated.   MLD with focus on the Rt axilla and breast with instruction on self performance: In supine: Short neck, superficial and deep abdominals, bil axilla and Rt inguinal nodes, anterior inter-axillary and Lt axillo-inguinal anastomosis, and Rt breast focusing on areas of swelling redirecting towards anastomosis.  Reviewed doorway and standing median nerve stretch with pinning and  taught pt open book stretch in sidelying  Date: 06/08/22  Reviewed doorway stretch and added self opposing traction for cording Answered pt questions regarding self exercises Performed MLD review seated in front of the mirror with review of all steps and modification as needed STM Rt pectoralis borders in arm out to the side and arm overhead position and cording release from axilla into upper arm as tolerated.   MLD with focus on the Rt axilla and breast with instruction on self performance: In supine: Short neck, superficial and deep abdominals, bil axilla and Rt inguinal nodes, anterior inter-axillary  and Lt axillo-inguinal anastomosis, and Rt breast focusing on areas of swelling redirecting towards anastomosis.   PATIENT EDUCATION:  Education details: cording, POC, breast edema Person educated: Patient Education method: Explanation Education comprehension: verbalized understanding   HOME EXERCISE PROGRAM: Self MLD, doorway stretch  ASSESSMENT: CLINICAL IMPRESSION: Pt is doing very well and seeing some improvement with pain.  Axilla is less puffy so cording is more pronounced 2-3 cords.     GOALS: Goals reviewed with patient? Yes   LONG TERM GOALS: Target date: 07/05/2022    Pt will decrease breast complaints questionnaire to 14 or less Baseline:  Goal status: INITIAL  2.  Pt will report no pain in the breast with bouncing activities Baseline:  Goal status: INITIAL  3.  Pt will report no arm pain in the morning Baseline:  Goal status: INITIAL  4.  Pt will be ind with self MLD for the breast Baseline:  Goal status: INITIAL   PLAN: PT FREQUENCY: 1x/week due to work schedule  PT DURATION: 6 weeks  PLANNED INTERVENTIONS: Therapeutic exercises, Patient/Family education, Self Care, Manual therapy, and Re-evaluation  PLAN FOR NEXT SESSION: Rt pectoralis/cording work, start Rt breast and upper arm MLD with education as able   Stark Bray, PT 06/22/2022, 8:50 AM

## 2022-06-23 ENCOUNTER — Telehealth: Payer: Self-pay

## 2022-06-23 NOTE — Telephone Encounter (Signed)
Placed call to pt to review results of bone density via [employer] reports. Pt's T-score is 1.5 indicative of osteopenia. Pt was given these results and recommendation of daily calcium of at least 1200 g in combination with vitamin D 11-4998 and WB exercises. Pt states she is concerned because she is dilligent in her intake of recommended supplements and she walks 30 min/daily, as well as weight lifting and yoga. Her concern is that she will continue to decline despite all of her efforts to maintain bone health. Advised pt osteopenia is not the same as osteoporosis and she is not at a great risk of fx at this time and that bone loss is a natural occurrence of aging, and given that she is taking letrozole, this also impacts the viability of her bone health. She verbalized understanding and will have a visit with [facility] soon to discuss results further.   Bone density scanned to pt's chart.

## 2022-06-24 ENCOUNTER — Encounter: Payer: Self-pay | Admitting: Adult Health

## 2022-06-28 ENCOUNTER — Encounter: Payer: Self-pay | Admitting: Adult Health

## 2022-06-30 ENCOUNTER — Encounter: Payer: Self-pay | Admitting: Rehabilitation

## 2022-06-30 ENCOUNTER — Ambulatory Visit: Payer: BC Managed Care – PPO | Admitting: Rehabilitation

## 2022-06-30 DIAGNOSIS — R600 Localized edema: Secondary | ICD-10-CM

## 2022-06-30 DIAGNOSIS — R293 Abnormal posture: Secondary | ICD-10-CM

## 2022-06-30 DIAGNOSIS — Z483 Aftercare following surgery for neoplasm: Secondary | ICD-10-CM | POA: Diagnosis not present

## 2022-06-30 DIAGNOSIS — Z170 Estrogen receptor positive status [ER+]: Secondary | ICD-10-CM

## 2022-06-30 NOTE — Patient Instructions (Signed)

## 2022-06-30 NOTE — Therapy (Signed)
OUTPATIENT PHYSICAL THERAPY ONCOLOGY TREATMENT  Patient Name: Tonya Haynes MRN: [id number] DOB:Jan 15, 1961, 61 y.o., female Today's Date: 06/30/2022   PT End of Session - 06/30/22 0852     Visit Number 9    Number of Visits 14    Date for PT Re-Evaluation 08/04/22    PT Start Time 0800    PT Stop Time 0850    PT Time Calculation (min) 50 min    Activity Tolerance Patient tolerated treatment well    Behavior During Therapy WFL for tasks assessed/performed               Past Medical History:  Diagnosis Date   Anxiety    Asthma    Breast cancer ([place])    Diastolic dysfunction    History of basal cell carcinoma    Hypertension    LBBB (left bundle branch block)    Unspecified asthma(493.90)     Problem list entry automatically replaced. Please review for accuracy.   Past Surgical History:  Procedure Laterality Date   AXILLARY SENTINEL NODE BIOPSY Right 08/19/2021   Procedure: AXILLARY SENTINEL NODE BIOPSY;  Surgeon: Donnie Mesa, MD;  Location: [facility];  Service: General;  Laterality: Right;   BASAL CELL CARCINOMA EXCISION     RIGHT DELTOID MUSCLE   BREAST BIOPSY Right 08/11/2021   BREAST LUMPECTOMY WITH RADIOACTIVE SEED AND SENTINEL LYMPH NODE BIOPSY Right 08/19/2021   Procedure: RIGHT BREAST LUMPECTOMY WITH RADIOACTIVE SEED X2;  Surgeon: Donnie Mesa, MD;  Location: [place];  Service: General;  Laterality: Right;  120 MINUTES ROOM 1   RADIOACTIVE SEED GUIDED AXILLARY SENTINEL LYMPH NODE Right 08/19/2021   Procedure: RADIOACTIVE SEED GUIDED RIGHT  AXILLARY SENTINEL LYMPH NODE DISSECTION;  Surgeon: Donnie Mesa, MD;  Location: [place] [facility];  Service: General;  Laterality: Right;   RE-EXCISION OF BREAST LUMPECTOMY Right 09/08/2021   Procedure: RE-EXCISION OF INFERIOR MARGIN RIGHT BREAST LUMPECTOMY;  Surgeon: Donnie Mesa, MD;  Location: [facility];  Service: General;  Laterality: Right;   Patient Active Problem List   Diagnosis Date Noted   Genetic  testing 08/06/2021   Family history of breast cancer 07/29/2021   Malignant neoplasm of upper-outer quadrant of right breast in female, estrogen receptor positive ([place]) 07/23/2021   Essential hypertension, malignant 12/12/2016   Hypertension    LBBB (left bundle branch block)    Anxiety    Unspecified BVAPOL(410.30)    Diastolic dysfunction    History of basal cell carcinoma     PCP: Harlan Stains , MD  REFERRING PROVIDER: Dr. Nicholas Lose  REFERRING DIAG: right breast cancer      THERAPY DIAG:  Aftercare following surgery for neoplasm  Malignant neoplasm of upper-outer quadrant of right breast in female, estrogen receptor positive ([place])  Abnormal posture  Localized edema  ONSET DATE: 08/19/21   Rationale for Evaluation and Treatment Rehabilitation  SUBJECTIVE  SUBJECTIVE STATEMENT:  I tried to not wear the hugger x 2 days and maybe it does help. I think it is getting better   PERTINENT HISTORY:  Patient was diagnosed on 06/18/2021 with right grade I invasive ductal carcinoma breast cancer. It measures 7 mm and is located in the upper outer quadrant. It is ER/PR positive and HER2 negative with a Ki67 of 1%. She has a biopsied positive axillary lymph node. Rt lumpectomy 08/19/21 with 1/3 LN positive.  Radiation completed 11/16/21. Antiestrogen therapy.   PAIN:  Are you having pain? No NPRS scale: up to 1/10 Pain location: Rt breast  Pain orientation: Right  PAIN TYPE: aching Pain description: intermittent  Aggravating factors: pushing on it, if my dog jumps on it Relieving factors: bra, foam  PRECAUTIONS: lymphedema risk Rt UE  OCCUPATION: full time Therapist, music   LEISURE: walks, yoga, light weights  PATIENT GOALS learn about pains and try to decrease  them   OBJECTIVE PALPATION: No breast fibrosis noted, more of a soft edema, tightness pectoralis, cording noted in axilla 2 cords to mid upper arm  OBSERVATIONS / OTHER ASSESSMENTS: enlarged pores, imprint from gauze and bra on breast, wearing gauze for rawness on Rt nipple.   POSTURE: WNL  UPPER EXTREMITY AROM/PROM:  A/PROM RIGHT   11/23/21  05/24/22 06/15/22  Shoulder extension     Shoulder flexion 165 168 168 - cording evident   Shoulder abduction 165 168 168 - cording evident   Shoulder internal rotation     Shoulder external rotation       (Blank rows = not tested)  Breast edema questionnaire: 24/80; 8/80 on 06/30/22  TODAY'S TREATMENT  Pt permission and consent throughout each step of examination and treatment with modification and draping if requested when working on sensitive areas Date: 06/30/22  After pt reported increased antecubital fossa edema we repeated her SOZO which is now elevated by 15.4.  She already has a sleeve so we issued a gauntlet as she notices a bit of wrist tightness with the sleeve with education on using this Education on self MLD now including the hand per instruction section with PT reviewing all steps and pt performing with cueing as needed.   Answered all questions regarding lymphedema and how it occurs, etc for the remainder of the treatment.    Date: 06/22/22  STM Rt pectoralis borders in arm out to the side and arm overhead position and cording release from axilla into upper arm as tolerated.   MLD with focus on the Rt axilla and breast with instruction on self performance: In supine: Short neck, superficial and deep abdominals, bil axilla and Rt inguinal nodes, anterior inter-axillary and Lt axillo-inguinal anastomosis, and Rt breast focusing on areas of swelling redirecting towards anastomosis.  Reviewed open book stretch in sidelying with switch to more of a Y position  Date: 06/15/22  STM Rt pectoralis borders in arm out to the side and arm  overhead position and cording release from axilla into upper arm as tolerated.   MLD with focus on the Rt axilla and breast with instruction on self performance: In supine: Short neck, superficial and deep abdominals, bil axilla and Rt inguinal nodes, anterior inter-axillary and Lt axillo-inguinal anastomosis, and Rt breast focusing on areas of swelling redirecting towards anastomosis.  Reviewed doorway and standing median nerve stretch with pinning and taught pt open book stretch in sidelying   PATIENT EDUCATION:  Education details: cording, POC, breast edema Person educated: Patient Education method: Explanation  Education comprehension: verbalized understanding   HOME EXERCISE PROGRAM: Self MLD, doorway stretch, self MLD, has sleeve and gauntlet  ASSESSMENT: CLINICAL IMPRESSION: Pt has now met breast edema goal but now has an elevated SOZO score into the red zone so we started compression x 4 weeks which pt was already doing 4 hours per day of wear.  Will extend POC x 5 more weeks at 1x per week.    GOALS: Goals reviewed with patient? Yes   LONG TERM GOALS: Target date: 07/05/2022    Pt will decrease breast complaints questionnaire to 14 or less Baseline:  Goal status: MET  2.  Pt will report no pain in the breast with bouncing activities Baseline:  Goal status:  still mild  3.  Pt will report no arm pain in the morning Baseline:  Goal status: smaller and more focused  4.  Pt will be ind with self MLD for the breast and arm Baseline:  Goal status: MET     PLAN: PT FREQUENCY: 1x/week due to work schedule  PT DURATION: 6 weeks  PLANNED INTERVENTIONS: Therapeutic exercises, Patient/Family education, Self Care, Manual therapy, and Re-evaluation  PLAN FOR NEXT SESSION: Rt pectoralis/cording work, start Rt breast and upper arm MLD with education as able, try supine scap?, *SOZO around 10/26*   Lashonta Pilling R, PT 06/30/2022, 8:54 AM

## 2022-07-03 NOTE — Progress Notes (Signed)
Patient Care Team: Harlan Stains, MD as PCP - General (Family Medicine) Nahser, Wonda Cheng, MD as PCP - Cardiology (Cardiology) Donnie Mesa, MD as Consulting Physician (General Surgery) Nicholas Lose, MD as Consulting Physician (Hematology and Oncology) Eppie Gibson, MD as Attending Physician (Radiation Oncology)  DIAGNOSIS: No diagnosis found.  SUMMARY OF ONCOLOGIC HISTORY: Oncology History  Malignant neoplasm of upper-outer quadrant of right breast in female, estrogen receptor positive ([place])  07/13/2021 Initial Diagnosis   Screening mammogram: possible 0.6 cm distortion in the right breast. Diagnostic mammogram and [place]: 0.7 cm distortion in the right breast. Biopsy: grade 1/2 IDC with axillary lymph node (+) involved by metastatic carcinoma, ER+(95%) PR+(90%) Her2- Ki-67 1%   07/28/2021 Cancer Staging   Staging form: Breast, AJCC 8th Edition - Clinical stage from 07/28/2021: Stage IIA (cT2, cN1(f), cM0, G2, ER+, PR+, HER2-) - Signed by Nicholas Lose, MD on 08/16/2021 Stage prefix: Initial diagnosis Method of lymph node assessment: Fine needle aspiration Histologic grading system: 3 grade system    Genetic Testing   [facility] CancerNext (36 genes) was Negative. Report date is 08/05/2021.  The CancerNext gene panel offered by [employer] includes sequencing, rearrangement analysis, and RNA analysis for the following 36 genes:   APC, ATM, AXIN2, BARD1, BMPR1A, BRCA1, BRCA2, BRIP1, CDH1, CDK4, CDKN2A, CHEK2, DICER1, HOXB13, EPCAM, GREM1, MLH1, MSH2, MSH3, MSH6, MUTYH, NBN, NF1, NTHL1, PALB2, PMS2, POLD1, POLE, PTEN, RAD51C, RAD51D, RECQL, SMAD4, SMARCA4, STK11, and TP53.    07/2021 -  Anti-estrogen oral therapy   Adjuvant antiestrogen therapy with letrozole started prior to lumpectomy   08/19/2021 Surgery   Right lumpectomy: 5 cm (2 foci contiguous) grade 2 IDC and ILC with DCIS, involves the inferior margin, ALH, 1/3 lymph nodes positive ER 70 to 95%, PR 50 to 90%, HER2 1+ by IHC,  Ki-67 1%   08/30/2021 Cancer Staging   Staging form: Breast, AJCC 8th Edition - Pathologic: Stage IB (pT3, pN1, cM0, G2, ER+, PR+, HER2-) - Signed by Nicholas Lose, MD on 08/30/2021 Stage prefix: Initial diagnosis Multigene prognostic tests performed: MammaPrint Histologic grading system: 3 grade system   10/07/2021 - 11/16/2021 Radiation Therapy   Site Technique Total Dose (Gy) Dose per Fx (Gy) Completed Fx Beam Energies  Breast, Right: Breast_R 3D 50/50 2 25/25 10X  Breast, Right: Breast_R_PAB_SCV 3D 50/50 2 25/25 6X, 15X  Breast, Right: Breast_R_Bst 3D 10/10 2 5/5 6X       CHIEF COMPLIANT: Follow-up right breast cancer surveillance on letrozole  INTERVAL HISTORY: Tonya Haynes is a 61 y.o with the above mentioned. She presents to the clinic for a follow-up.    ALLERGIES:  is allergic to chlorhexidine gluconate.  MEDICATIONS:  Current Outpatient Medications  Medication Sig Dispense Refill   acetaminophen (TYLENOL) 500 MG tablet Take 1,000 mg by mouth every 6 (six) hours as needed for moderate pain.     fexofenadine (ALLEGRA) 180 MG tablet Take 180 mg by mouth daily as needed for allergies or rhinitis.     hydrochlorothiazide (MICROZIDE) 12.5 MG capsule Take 1 capsule (12.5 mg total) by mouth daily. 90 capsule 3   letrozole (FEMARA) 2.5 MG tablet TAKE 1 TABLET(2.5 MG) BY MOUTH DAILY 90 tablet 3   lisinopril (ZESTRIL) 10 MG tablet TAKE 1 TABLET(10 MG) BY MOUTH DAILY 90 tablet 3   Multiple Minerals-Vitamins (CITRACAL MAXIMUM PLUS) TABS 64m Calcium, 1000 IU Vitamin D daily     Multiple Vitamin (MULTI-VITAMIN PO) Take 1 capsule by mouth daily.      nebivolol (BYSTOLIC)  10 MG tablet Take 1 tablet (10 mg total) by mouth daily. 90 tablet 2   potassium chloride (KLOR-CON) 10 MEQ tablet TAKE 1 TABLET(10 MEQ) BY MOUTH DAILY 90 tablet 3   rosuvastatin (CRESTOR) 5 MG tablet Take 1 tablet (5 mg total) by mouth daily. 90 tablet 3   Triamcinolone Acetonide (NASACORT ALLERGY 24HR NA)  Place 1 spray into the nose daily as needed.     No current facility-administered medications for this visit.    PHYSICAL EXAMINATION: ECOG PERFORMANCE STATUS: {CHL ONC ECOG PS:[phone]}  There were no vitals filed for this visit. There were no vitals filed for this visit.  BREAST:*** No palpable masses or nodules in either right or left breasts. No palpable axillary supraclavicular or infraclavicular adenopathy no breast tenderness or nipple discharge. (exam performed in the presence of a chaperone)  LABORATORY DATA:  I have reviewed the data as listed    Latest Ref Rng & Units 03/28/2022    9:55 AM 01/03/2022    9:22 AM 09/30/2021   10:35 AM  CMP  ALT 0 - 32 IU/L _0 Lab Results  Component Value Date   WBC 6.1 07/28/2021   HGB 13.7 07/28/2021   HCT 39.7 07/28/2021   MCV 86.7 07/28/2021   PLT 133 (L) 07/28/2021   NEUTROABS 4.2 07/28/2021    ASSESSMENT & PLAN:  No problem-specific Assessment & Plan notes found for this encounter.    No orders of the defined types were placed in this encounter.  The patient has a good understanding of the overall plan. she agrees with it. she will call with any problems that may develop before the next visit here. Total time spent: 30 mins including face to face time and time spent for planning, charting and co-ordination of care   Tonya Haynes, [place] 07/03/22    I Gardiner Coins am scribing for Dr. Lindi Adie  ***

## 2022-07-06 ENCOUNTER — Other Ambulatory Visit: Payer: Self-pay

## 2022-07-06 ENCOUNTER — Inpatient Hospital Stay: Payer: BC Managed Care – PPO | Attending: Hematology and Oncology | Admitting: Hematology and Oncology

## 2022-07-06 DIAGNOSIS — C773 Secondary and unspecified malignant neoplasm of axilla and upper limb lymph nodes: Secondary | ICD-10-CM | POA: Diagnosis not present

## 2022-07-06 DIAGNOSIS — M79601 Pain in right arm: Secondary | ICD-10-CM | POA: Insufficient documentation

## 2022-07-06 DIAGNOSIS — Z79811 Long term (current) use of aromatase inhibitors: Secondary | ICD-10-CM | POA: Insufficient documentation

## 2022-07-06 DIAGNOSIS — C50411 Malignant neoplasm of upper-outer quadrant of right female breast: Secondary | ICD-10-CM | POA: Insufficient documentation

## 2022-07-06 DIAGNOSIS — Z170 Estrogen receptor positive status [ER+]: Secondary | ICD-10-CM | POA: Insufficient documentation

## 2022-07-06 DIAGNOSIS — Z79899 Other long term (current) drug therapy: Secondary | ICD-10-CM | POA: Insufficient documentation

## 2022-07-06 DIAGNOSIS — R609 Edema, unspecified: Secondary | ICD-10-CM | POA: Insufficient documentation

## 2022-07-06 NOTE — Assessment & Plan Note (Signed)
07/13/2021:Screening mammogram: possible 0.6 cm distortion in the right breast. Diagnostic mammogram and [place]: 0.7 cm distortion in the right breast. 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 08/19/2021:Right lumpectomy: 5 cm (2 foci contiguous) grade 2 IDC and ILC with DCIS, involves the inferior margin, ALH, 1/3 lymph nodes positive ER 70 to 95%, PR 50 to 90%, HER2 1+ by IHC, Ki-67 1% MammaPrint on initial biopsy: Low risk MammaPrint on the final lymph node biopsy: High risk (but the MammaPrint index was extremely low and therefore the probability of benefit from chemotherapy was extremely small)  Treatment plan: 1. Adjuvant radiation therapy completed 11/16/21 2. Followed by adjuvant antiestrogen therapy(she started letrozole prior to surgery)  Breast Cancer Surveillance: 1. Breast Exam 07/06/22: Benign 2. Mammograms: 06/20/22: [facility] Benign density Cat A Bone density 06/20/22: Osteopenia T score -1.5  RTC in 1 year

## 2022-07-07 ENCOUNTER — Ambulatory Visit: Payer: BC Managed Care – PPO | Attending: Hematology and Oncology | Admitting: Rehabilitation

## 2022-07-07 DIAGNOSIS — R293 Abnormal posture: Secondary | ICD-10-CM | POA: Diagnosis present

## 2022-07-07 DIAGNOSIS — Z483 Aftercare following surgery for neoplasm: Secondary | ICD-10-CM | POA: Insufficient documentation

## 2022-07-07 DIAGNOSIS — Z170 Estrogen receptor positive status [ER+]: Secondary | ICD-10-CM | POA: Insufficient documentation

## 2022-07-07 DIAGNOSIS — C50411 Malignant neoplasm of upper-outer quadrant of right female breast: Secondary | ICD-10-CM | POA: Diagnosis present

## 2022-07-07 DIAGNOSIS — R600 Localized edema: Secondary | ICD-10-CM | POA: Diagnosis present

## 2022-07-07 NOTE — Therapy (Signed)
OUTPATIENT PHYSICAL THERAPY ONCOLOGY TREATMENT  Patient Name: Tonya Haynes MRN: [id number] DOB:November 10, 1960, 61 y.o., female Today's Date: 07/07/2022   PT End of Session - 07/07/22 0848     Visit Number 10    Number of Visits 14    Date for PT Re-Evaluation 08/04/22    PT Start Time 0802    PT Stop Time 0850    PT Time Calculation (min) 48 min    Activity Tolerance Patient tolerated treatment well    Behavior During Therapy WFL for tasks assessed/performed                Past Medical History:  Diagnosis Date   Anxiety    Asthma    Breast cancer ([place])    Diastolic dysfunction    History of basal cell carcinoma    Hypertension    LBBB (left bundle branch block)    Unspecified asthma(493.90)     Problem list entry automatically replaced. Please review for accuracy.   Past Surgical History:  Procedure Laterality Date   AXILLARY SENTINEL NODE BIOPSY Right 08/19/2021   Procedure: AXILLARY SENTINEL NODE BIOPSY;  Surgeon: Donnie Mesa, MD;  Location: [place];  Service: General;  Laterality: Right;   BASAL CELL CARCINOMA EXCISION     RIGHT DELTOID MUSCLE   BREAST BIOPSY Right 08/11/2021   BREAST LUMPECTOMY WITH RADIOACTIVE SEED AND SENTINEL LYMPH NODE BIOPSY Right 08/19/2021   Procedure: RIGHT BREAST LUMPECTOMY WITH RADIOACTIVE SEED X2;  Surgeon: Donnie Mesa, MD;  Location: [place];  Service: General;  Laterality: Right;  120 MINUTES ROOM 1   RADIOACTIVE SEED GUIDED AXILLARY SENTINEL LYMPH NODE Right 08/19/2021   Procedure: RADIOACTIVE SEED GUIDED RIGHT  AXILLARY SENTINEL LYMPH NODE DISSECTION;  Surgeon: Donnie Mesa, MD;  Location: [place];  Service: General;  Laterality: Right;   RE-EXCISION OF BREAST LUMPECTOMY Right 09/08/2021   Procedure: RE-EXCISION OF INFERIOR MARGIN RIGHT BREAST LUMPECTOMY;  Surgeon: Donnie Mesa, MD;  Location: [place];  Service: General;  Laterality: Right;   Patient Active Problem List   Diagnosis Date Noted   Genetic  testing 08/06/2021   Family history of breast cancer 07/29/2021   Asthma 07/27/2021   Malignant neoplasm of upper-outer quadrant of right breast in female, estrogen receptor positive ([place]) 07/23/2021   Essential hypertension, malignant 12/12/2016   Hypertension    LBBB (left bundle branch block)    Anxiety    Unspecified LTRVUY(233.43)    Diastolic dysfunction    History of basal cell carcinoma     PCP: Harlan Stains , MD  REFERRING PROVIDER: Dr. Nicholas Lose  REFERRING DIAG: right breast cancer      THERAPY DIAG:  Aftercare following surgery for neoplasm  Malignant neoplasm of upper-outer quadrant of right breast in female, estrogen receptor positive ([place])  Abnormal posture  Localized edema  ONSET DATE: 08/19/21   Rationale for Evaluation and Treatment Rehabilitation  SUBJECTIVE  SUBJECTIVE STATEMENT:  The compression is fine.  The pain I feel when reaching for my dog treats is getting smaller   PERTINENT HISTORY:  Patient was diagnosed on 06/18/2021 with right grade I invasive ductal carcinoma breast cancer. It measures 7 mm and is located in the upper outer quadrant. It is ER/PR positive and HER2 negative with a Ki67 of 1%. She has a biopsied positive axillary lymph node. Rt lumpectomy 08/19/21 with 1/3 LN positive.  Radiation completed 11/16/21. Antiestrogen therapy.   PAIN:  Are you having pain? No  PRECAUTIONS: lymphedema risk Rt UE  OCCUPATION: full time Therapist, music   LEISURE: walks, yoga, light weights  PATIENT GOALS learn about pains and try to decrease them   OBJECTIVE PALPATION: No breast fibrosis noted, more of a soft edema, tightness pectoralis, cording noted in axilla 2 cords to mid upper arm  OBSERVATIONS / OTHER ASSESSMENTS: enlarged pores, imprint from gauze  and bra on breast, wearing gauze for rawness on Rt nipple.   POSTURE: WNL  UPPER EXTREMITY AROM/PROM:  A/PROM RIGHT   11/23/21  05/24/22 06/15/22  Shoulder extension     Shoulder flexion 165 168 168 - cording evident   Shoulder abduction 165 168 168 - cording evident   Shoulder internal rotation     Shoulder external rotation       (Blank rows = not tested)  Breast edema questionnaire: 24/80; 8/80 on 06/30/22  TODAY'S TREATMENT  Pt permission and consent throughout each step of examination and treatment with modification and draping if requested when working on sensitive areas Date: 07/07/22  Pulleys into flexion - easy, ball flexion and abduction  Answered questions regarding treatment  STM Rt pectoralis borders in arm out to the side and arm overhead position and cording release from axilla into upper arm as tolerated.   MLD with focus on the Rt axilla and breast with instruction on self performance: In supine: Short neck, superficial and deep abdominals, bil axilla and Rt inguinal nodes, anterior inter-axillary and Lt axillo-inguinal anastomosis, and Rt breast focusing on areas of swelling redirecting towards anastomosis.   Date: 06/30/22  After pt reported increased antecubital fossa edema we repeated her SOZO which is now elevated by 15.4.  She already has a sleeve so we issued a gauntlet as she notices a bit of wrist tightness with the sleeve with education on using this Education on self MLD now including the hand per instruction section with PT reviewing all steps and pt performing with cueing as needed.   Answered all questions regarding lymphedema and how it occurs, etc for the remainder of the treatment.    Date: 06/22/22  STM Rt pectoralis borders in arm out to the side and arm overhead position and cording release from axilla into upper arm as tolerated.   MLD with focus on the Rt axilla and breast with instruction on self performance: In supine: Short neck, superficial and  deep abdominals, bil axilla and Rt inguinal nodes, anterior inter-axillary and Lt axillo-inguinal anastomosis, and Rt breast focusing on areas of swelling redirecting towards anastomosis.  Reviewed open book stretch in sidelying with switch to more of a Y position   PATIENT EDUCATION:  Education details: cording, POC, breast edema Person educated: Patient Education method: Explanation Education comprehension: verbalized understanding   HOME EXERCISE PROGRAM: Self MLD, doorway stretch, self MLD, has sleeve and gauntlet  ASSESSMENT: CLINICAL IMPRESSION:  Pt seems to be doing well and the axillary congestion seems to be the most limiting -  doing well with all self care and does not feel much pull with stretches.  Will continue with POC.     GOALS: Goals reviewed with patient? Yes   LONG TERM GOALS: Target date: 07/05/2022    Pt will decrease breast complaints questionnaire to 14 or less Baseline:  Goal status: MET  2.  Pt will report no pain in the breast with bouncing activities Baseline:  Goal status:  still mild  3.  Pt will report no arm pain in the morning Baseline:  Goal status: smaller and more focused  4.  Pt will be ind with self MLD for the breast and arm Baseline:  Goal status: MET     PLAN: PT FREQUENCY: 1x/week due to work schedule  PT DURATION: 6 weeks  PLANNED INTERVENTIONS: Therapeutic exercises, Patient/Family education, Self Care, Manual therapy, and Re-evaluation  PLAN FOR NEXT SESSION: Rt pectoralis/cording work, start Rt breast and upper arm MLD with education as able, try supine scap?, *SOZO around 10/26*   Hermon Zea R, PT 07/07/2022, 8:49 AM

## 2022-07-14 ENCOUNTER — Encounter: Payer: Self-pay | Admitting: Rehabilitation

## 2022-07-14 ENCOUNTER — Ambulatory Visit: Payer: BC Managed Care – PPO | Admitting: Rehabilitation

## 2022-07-14 DIAGNOSIS — Z483 Aftercare following surgery for neoplasm: Secondary | ICD-10-CM

## 2022-07-14 DIAGNOSIS — C50411 Malignant neoplasm of upper-outer quadrant of right female breast: Secondary | ICD-10-CM

## 2022-07-14 DIAGNOSIS — R293 Abnormal posture: Secondary | ICD-10-CM

## 2022-07-14 DIAGNOSIS — R600 Localized edema: Secondary | ICD-10-CM

## 2022-07-14 NOTE — Therapy (Signed)
OUTPATIENT PHYSICAL THERAPY ONCOLOGY TREATMENT  Patient Name: Gwendolynn Merkey MRN: [id number] DOB:08-Mar-1961, 61 y.o., female Today's Date: 07/14/2022   PT End of Session - 07/14/22 1002     Visit Number 11    Number of Visits 14    Date for PT Re-Evaluation 08/04/22    PT Start Time 5631    PT Stop Time 1041    PT Time Calculation (min) 39 min    Activity Tolerance Patient tolerated treatment well    Behavior During Therapy WFL for tasks assessed/performed                 Past Medical History:  Diagnosis Date   Anxiety    Asthma    Breast cancer ([place])    Diastolic dysfunction    History of basal cell carcinoma    Hypertension    LBBB (left bundle branch block)    Unspecified asthma(493.90)     Problem list entry automatically replaced. Please review for accuracy.   Past Surgical History:  Procedure Laterality Date   AXILLARY SENTINEL NODE BIOPSY Right 08/19/2021   Procedure: AXILLARY SENTINEL NODE BIOPSY;  Surgeon: Donnie Mesa, MD;  Location: [place];  Service: General;  Laterality: Right;   BASAL CELL CARCINOMA EXCISION     RIGHT DELTOID MUSCLE   BREAST BIOPSY Right 08/11/2021   BREAST LUMPECTOMY WITH RADIOACTIVE SEED AND SENTINEL LYMPH NODE BIOPSY Right 08/19/2021   Procedure: RIGHT BREAST LUMPECTOMY WITH RADIOACTIVE SEED X2;  Surgeon: Donnie Mesa, MD;  Location: [place];  Service: General;  Laterality: Right;  120 MINUTES ROOM 1   RADIOACTIVE SEED GUIDED AXILLARY SENTINEL LYMPH NODE Right 08/19/2021   Procedure: RADIOACTIVE SEED GUIDED RIGHT  AXILLARY SENTINEL LYMPH NODE DISSECTION;  Surgeon: Donnie Mesa, MD;  Location: [place];  Service: General;  Laterality: Right;   RE-EXCISION OF BREAST LUMPECTOMY Right 09/08/2021   Procedure: RE-EXCISION OF INFERIOR MARGIN RIGHT BREAST LUMPECTOMY;  Surgeon: Donnie Mesa, MD;  Location: [place];  Service: General;  Laterality: Right;   Patient Active Problem List   Diagnosis Date Noted    Genetic testing 08/06/2021   Family history of breast cancer 07/29/2021   Asthma 07/27/2021   Malignant neoplasm of upper-outer quadrant of right breast in female, estrogen receptor positive ([place]) 07/23/2021   Essential hypertension, malignant 12/12/2016   Hypertension    LBBB (left bundle branch block)    Anxiety    Unspecified SHFWYO(378.58)    Diastolic dysfunction    History of basal cell carcinoma     PCP: Harlan Stains , MD  REFERRING PROVIDER: Dr. Nicholas Lose  REFERRING DIAG: right breast cancer      THERAPY DIAG:  Malignant neoplasm of upper-outer quadrant of right breast in female, estrogen receptor positive [facility])  Aftercare following surgery for neoplasm  Abnormal posture  Localized edema  ONSET DATE: 08/19/21   Rationale for Evaluation and Treatment Rehabilitation  SUBJECTIVE  SUBJECTIVE STATEMENT:  She is wearing the arm sleeves 14-16 hours and gauntlet for 10 hours. She has been doing the MLD everyday, doing the 5# pound exercise, stretches and walks everyday, She notice a pain in her axillary in the morning and random times of the day. The stretching pain is at her wrist and in armpit. She feels a little puffy in the armpit. She is going on longer hikes and notice her hand swelling in both hands when she walks.    PERTINENT HISTORY:  Patient was diagnosed on 06/18/2021 with right grade I invasive ductal carcinoma breast cancer. It measures 7 mm and is located in the upper outer quadrant. It is ER/PR positive and HER2 negative with a Ki67 of 1%. She has a biopsied positive axillary lymph node. Rt lumpectomy 08/19/21 with 1/3 LN positive.  Radiation completed 11/16/21. Antiestrogen therapy.   PAIN:  Are you having pain? No  PRECAUTIONS: lymphedema risk Rt UE  OCCUPATION: full time  Therapist, music   LEISURE: walks, yoga, light weights  PATIENT GOALS learn about pains and try to decrease them   OBJECTIVE PALPATION: No breast fibrosis noted, more of a soft edema, tightness pectoralis, cording noted in axilla 2 cords to mid upper arm  OBSERVATIONS / OTHER ASSESSMENTS: enlarged pores, imprint from gauze and bra on breast, wearing gauze for rawness on Rt nipple.   POSTURE: WNL  UPPER EXTREMITY AROM/PROM:  A/PROM RIGHT   11/23/21  05/24/22 06/15/22  Shoulder extension     Shoulder flexion 165 168 168 - cording evident   Shoulder abduction 165 168 168 - cording evident   Shoulder internal rotation     Shoulder external rotation       (Blank rows = not tested)  Breast edema questionnaire: 24/80; 8/80 on 06/30/22  TODAY'S TREATMENT  Pt permission and consent throughout each step of examination and treatment with modification and draping if requested when working on sensitive areas Date: 07/14/2022 Answered questions regarding treatment  STM Rt pectoralis borders in arm out to the side and arm overhead position and cording release from axilla into upper arm as tolerated.   MLD with focus on the Rt axilla and breast with instruction on self performance: In supine: Short neck, superficial and deep abdominals, bil axilla and Rt inguinal nodes, anterior inter-axillary and Lt axillo-inguinal anastomosis, and Rt breast focusing on areas of swelling redirecting towards anastomosis.  Date: 07/07/22  Pulleys into flexion - easy, ball flexion and abduction  Answered questions regarding treatment  STM Rt pectoralis borders in arm out to the side and arm overhead position and cording release from axilla into upper arm as tolerated.   MLD with focus on the Rt axilla and breast with instruction on self performance: In supine: Short neck, superficial and deep abdominals, bil axilla and Rt inguinal nodes, anterior inter-axillary and Lt axillo-inguinal anastomosis, and Rt breast  focusing on areas of swelling redirecting towards anastomosis.   Date: 06/30/22  After pt reported increased antecubital fossa edema we repeated her SOZO which is now elevated by 15.4.  She already has a sleeve so we issued a gauntlet as she notices a bit of wrist tightness with the sleeve with education on using this Education on self MLD now including the hand per instruction section with PT reviewing all steps and pt performing with cueing as needed.   Answered all questions regarding lymphedema and how it occurs, etc for the remainder of the treatment.    Date: 06/22/22  STM Rt  pectoralis borders in arm out to the side and arm overhead position and cording release from axilla into upper arm as tolerated.   MLD with focus on the Rt axilla and breast with instruction on self performance: In supine: Short neck, superficial and deep abdominals, bil axilla and Rt inguinal nodes, anterior inter-axillary and Lt axillo-inguinal anastomosis, and Rt breast focusing on areas of swelling redirecting towards anastomosis.  Reviewed open book stretch in sidelying with switch to more of a Y position   PATIENT EDUCATION:  Education details: cording, POC, breast edema Person educated: Patient Education method: Explanation Education comprehension: verbalized understanding   HOME EXERCISE PROGRAM: Self MLD, doorway stretch, self MLD, has sleeve and gauntlet  ASSESSMENT: CLINICAL IMPRESSION:  Pt responded well to therapy today. She has some congestion in the axillary and that is the most restricted area. Patient demonstrates great range of motion and is  doing well with all self care. Patient required less manual therapy and stretching in today's session  Patient is recommend for continue skilled therapy to continue with POC.    GOALS: Goals reviewed with patient? Yes   LONG TERM GOALS: Target date: 07/05/2022    Pt will decrease breast complaints questionnaire to 14 or less Baseline:  Goal status:  MET  2.  Pt will report no pain in the breast with bouncing activities Baseline:  Goal status:  still mild  3.  Pt will report no arm pain in the morning Baseline:  Goal status: smaller and more focused  4.  Pt will be ind with self MLD for the breast and arm Baseline:  Goal status: MET     PLAN: PT FREQUENCY: 1x/week due to work schedule  PT DURATION: 6 weeks  PLANNED INTERVENTIONS: Therapeutic exercises, Patient/Family education, Self Care, Manual therapy, and Re-evaluation  PLAN FOR NEXT SESSION: Rt breast and upper arm MLD with education as able ,*SOZO around 10/26*   Gavyn Ybarra, Student-PT 07/14/2022, 10:43 AM

## 2022-07-21 ENCOUNTER — Ambulatory Visit: Payer: BC Managed Care – PPO | Admitting: Rehabilitation

## 2022-07-21 ENCOUNTER — Encounter: Payer: Self-pay | Admitting: Rehabilitation

## 2022-07-21 DIAGNOSIS — Z170 Estrogen receptor positive status [ER+]: Secondary | ICD-10-CM

## 2022-07-21 DIAGNOSIS — R600 Localized edema: Secondary | ICD-10-CM

## 2022-07-21 DIAGNOSIS — Z483 Aftercare following surgery for neoplasm: Secondary | ICD-10-CM | POA: Diagnosis not present

## 2022-07-21 DIAGNOSIS — R293 Abnormal posture: Secondary | ICD-10-CM

## 2022-07-21 NOTE — Therapy (Signed)
OUTPATIENT PHYSICAL THERAPY ONCOLOGY TREATMENT  Patient Name: Tonya Haynes MRN: [id number] DOB:11-22-1960, 61 y.o., female Today's Date: 07/21/2022   PT End of Session - 07/21/22 0801     Visit Number 12    Number of Visits 14    Date for PT Re-Evaluation 08/04/22    PT Start Time 0802    Activity Tolerance Patient tolerated treatment well    Behavior During Therapy [facility] for tasks assessed/performed                  Past Medical History:  Diagnosis Date   Anxiety    Asthma    Breast cancer ([place])    Diastolic dysfunction    History of basal cell carcinoma    Hypertension    LBBB (left bundle branch block)    Unspecified asthma(493.90)     Problem list entry automatically replaced. Please review for accuracy.   Past Surgical History:  Procedure Laterality Date   AXILLARY SENTINEL NODE BIOPSY Right 08/19/2021   Procedure: AXILLARY SENTINEL NODE BIOPSY;  Surgeon: Donnie Mesa, MD;  Location: [place];  Service: General;  Laterality: Right;   BASAL CELL CARCINOMA EXCISION     RIGHT DELTOID MUSCLE   BREAST BIOPSY Right 08/11/2021   BREAST LUMPECTOMY WITH RADIOACTIVE SEED AND SENTINEL LYMPH NODE BIOPSY Right 08/19/2021   Procedure: RIGHT BREAST LUMPECTOMY WITH RADIOACTIVE SEED X2;  Surgeon: Donnie Mesa, MD;  Location: [place];  Service: General;  Laterality: Right;  120 MINUTES ROOM 1   RADIOACTIVE SEED GUIDED AXILLARY SENTINEL LYMPH NODE Right 08/19/2021   Procedure: RADIOACTIVE SEED GUIDED RIGHT  AXILLARY SENTINEL LYMPH NODE DISSECTION;  Surgeon: Donnie Mesa, MD;  Location: [place];  Service: General;  Laterality: Right;   RE-EXCISION OF BREAST LUMPECTOMY Right 09/08/2021   Procedure: RE-EXCISION OF INFERIOR MARGIN RIGHT BREAST LUMPECTOMY;  Surgeon: Donnie Mesa, MD;  Location: [place];  Service: General;  Laterality: Right;   Patient Active Problem List   Diagnosis Date Noted   Genetic testing 08/06/2021   Family history of breast cancer  07/29/2021   Asthma 07/27/2021   Malignant neoplasm of upper-outer quadrant of right breast in female, estrogen receptor positive ([place]) 07/23/2021   Essential hypertension, malignant 12/12/2016   Hypertension    LBBB (left bundle branch block)    Anxiety    Unspecified CZYSAY(301.60)    Diastolic dysfunction    History of basal cell carcinoma     PCP: Harlan Stains , MD  REFERRING PROVIDER: Dr. Nicholas Lose  REFERRING DIAG: right breast cancer      THERAPY DIAG:  Malignant neoplasm of upper-outer quadrant of right breast in female, estrogen receptor positive ([place])  Abnormal posture  Localized edema  Aftercare following surgery for neoplasm  ONSET DATE: 08/19/21   Rationale for Evaluation and Treatment Rehabilitation  SUBJECTIVE  SUBJECTIVE STATEMENT:   A little aching and still intermittent sharp pains.  Is still feels full in the armpit   PERTINENT HISTORY:  Patient was diagnosed on 06/18/2021 with right grade I invasive ductal carcinoma breast cancer. It measures 7 mm and is located in the upper outer quadrant. It is ER/PR positive and HER2 negative with a Ki67 of 1%. She has a biopsied positive axillary lymph node. Rt lumpectomy 08/19/21 with 1/3 LN positive.  Radiation completed 11/16/21. Antiestrogen therapy.   PAIN:  Are you having pain? No  PRECAUTIONS: lymphedema risk Rt UE  OCCUPATION: full time Therapist, music   LEISURE: walks, yoga, light weights  PATIENT GOALS learn about pains and try to decrease them   OBJECTIVE PALPATION: No breast fibrosis noted, more of a soft edema, tightness pectoralis, cording noted in axilla 2 cords to mid upper arm  OBSERVATIONS / OTHER ASSESSMENTS: enlarged pores, imprint from gauze and bra on breast, wearing gauze for rawness on Rt nipple.    POSTURE: WNL  UPPER EXTREMITY AROM/PROM:  A/PROM RIGHT   11/23/21  05/24/22 06/15/22  Shoulder extension     Shoulder flexion 165 168 168 - cording evident   Shoulder abduction 165 168 168 - cording evident   Shoulder internal rotation     Shoulder external rotation       (Blank rows = not tested)  Breast edema questionnaire: 24/80; 8/80 on 06/30/22  TODAY'S TREATMENT  Pt permission and consent throughout each step of examination and treatment with modification and draping if requested when working on sensitive areas Date: 07/21/2022 Answered questions regarding treatment  STM Rt pectoralis borders in arm out to the side and arm overhead position and cording release from axilla into upper arm as tolerated.   MLD with focus on the Rt axilla and breast with instruction on self performance: In supine: Short neck, superficial and deep abdominals, bil axilla and Rt inguinal nodes, anterior inter-axillary and Lt axillo-inguinal anastomosis, and Rt breast focusing on areas of swelling redirecting towards anastomosis.   Date: 07/14/2022 Answered questions regarding treatment  STM Rt pectoralis borders in arm out to the side and arm overhead position and cording release from axilla into upper arm as tolerated.   MLD with focus on the Rt axilla and breast with instruction on self performance: In supine: Short neck, superficial and deep abdominals, bil axilla and Rt inguinal nodes, anterior inter-axillary and Lt axillo-inguinal anastomosis, and Rt breast focusing on areas of swelling redirecting towards anastomosis.  Date: 07/07/22  Pulleys into flexion - easy, ball flexion and abduction  Answered questions regarding treatment  STM Rt pectoralis borders in arm out to the side and arm overhead position and cording release from axilla into upper arm as tolerated.   MLD with focus on the Rt axilla and breast with instruction on self performance: In supine: Short neck, superficial and deep abdominals,  bil axilla and Rt inguinal nodes, anterior inter-axillary and Lt axillo-inguinal anastomosis, and Rt breast focusing on areas of swelling redirecting towards anastomosis.   PATIENT EDUCATION:  Education details: cording, POC, breast edema Person educated: Patient Education method: Explanation Education comprehension: verbalized understanding  HOME EXERCISE PROGRAM: Self MLD, doorway stretch, self MLD, has sleeve and gauntlet  ASSESSMENT: CLINICAL IMPRESSION: Cording and axillary congestion are still present but overall doing very well.  Will redo SOZO next visit and go from there with more appointments.    GOALS: Goals reviewed with patient? Yes   LONG TERM GOALS: Target date: 07/05/2022  Pt will decrease breast complaints questionnaire to 14 or less Baseline:  Goal status: MET  2.  Pt will report no pain in the breast with bouncing activities Baseline:  Goal status:  still mild  3.  Pt will report no arm pain in the morning Baseline:  Goal status: smaller and more focused  4.  Pt will be ind with self MLD for the breast and arm Baseline:  Goal status: MET     PLAN: PT FREQUENCY: 1x/week due to work schedule  PT DURATION: 6 weeks  PLANNED INTERVENTIONS: Therapeutic exercises, Patient/Family education, Self Care, Manual therapy, and Re-evaluation  PLAN FOR NEXT SESSION: Rt breast and upper arm MLD with education as able ,*SOZO around 10/26*   Kiearra Oyervides R, PT 07/21/2022, 8:02 AM

## 2022-07-26 ENCOUNTER — Encounter: Payer: Self-pay | Admitting: Rehabilitation

## 2022-07-26 ENCOUNTER — Ambulatory Visit: Payer: BC Managed Care – PPO | Admitting: Rehabilitation

## 2022-07-26 DIAGNOSIS — R293 Abnormal posture: Secondary | ICD-10-CM

## 2022-07-26 DIAGNOSIS — Z170 Estrogen receptor positive status [ER+]: Secondary | ICD-10-CM

## 2022-07-26 DIAGNOSIS — Z483 Aftercare following surgery for neoplasm: Secondary | ICD-10-CM

## 2022-07-26 DIAGNOSIS — C50411 Malignant neoplasm of upper-outer quadrant of right female breast: Secondary | ICD-10-CM

## 2022-07-26 DIAGNOSIS — R600 Localized edema: Secondary | ICD-10-CM

## 2022-07-26 NOTE — Therapy (Signed)
OUTPATIENT PHYSICAL THERAPY ONCOLOGY TREATMENT  Patient Name: Tonya Haynes MRN: [id number] DOB:24-Jun-1961, 61 y.o., female Today's Date: 07/26/2022   PT End of Session - 07/26/22 0851     Visit Number 13    Number of Visits 14    Date for PT Re-Evaluation 08/04/22    PT Start Time 0803    PT Stop Time 0849    PT Time Calculation (min) 46 min    Activity Tolerance Patient tolerated treatment well    Behavior During Therapy [facility] for tasks assessed/performed                   Past Medical History:  Diagnosis Date   Anxiety    Asthma    Breast cancer ([place])    Diastolic dysfunction    History of basal cell carcinoma    Hypertension    LBBB (left bundle branch block)    Unspecified asthma(493.90)     Problem list entry automatically replaced. Please review for accuracy.   Past Surgical History:  Procedure Laterality Date   AXILLARY SENTINEL NODE BIOPSY Right 08/19/2021   Procedure: AXILLARY SENTINEL NODE BIOPSY;  Surgeon: Donnie Mesa, MD;  Location: [place];  Service: General;  Laterality: Right;   BASAL CELL CARCINOMA EXCISION     RIGHT DELTOID MUSCLE   BREAST BIOPSY Right 08/11/2021   BREAST LUMPECTOMY WITH RADIOACTIVE SEED AND SENTINEL LYMPH NODE BIOPSY Right 08/19/2021   Procedure: RIGHT BREAST LUMPECTOMY WITH RADIOACTIVE SEED X2;  Surgeon: Donnie Mesa, MD;  Location: [place];  Service: General;  Laterality: Right;  120 MINUTES ROOM 1   RADIOACTIVE SEED GUIDED AXILLARY SENTINEL LYMPH NODE Right 08/19/2021   Procedure: RADIOACTIVE SEED GUIDED RIGHT  AXILLARY SENTINEL LYMPH NODE DISSECTION;  Surgeon: Donnie Mesa, MD;  Location: [place];  Service: General;  Laterality: Right;   RE-EXCISION OF BREAST LUMPECTOMY Right 09/08/2021   Procedure: RE-EXCISION OF INFERIOR MARGIN RIGHT BREAST LUMPECTOMY;  Surgeon: Donnie Mesa, MD;  Location: [place];  Service: General;  Laterality: Right;   Patient Active Problem List   Diagnosis Date Noted    Genetic testing 08/06/2021   Family history of breast cancer 07/29/2021   Asthma 07/27/2021   Malignant neoplasm of upper-outer quadrant of right breast in female, estrogen receptor positive ([place]) 07/23/2021   Essential hypertension, malignant 12/12/2016   Hypertension    LBBB (left bundle branch block)    Anxiety    Unspecified EYCXKG(818.56)    Diastolic dysfunction    History of basal cell carcinoma     PCP: Harlan Stains , MD  REFERRING PROVIDER: Dr. Nicholas Lose  REFERRING DIAG: right breast cancer      THERAPY DIAG:  Malignant neoplasm of upper-outer quadrant of right breast in female, estrogen receptor positive ([place])  Abnormal posture  Localized edema  Aftercare following surgery for neoplasm  ONSET DATE: 08/19/21   Rationale for Evaluation and Treatment Rehabilitation  SUBJECTIVE  SUBJECTIVE STATEMENT:   I am doing okay  PERTINENT HISTORY:  Patient was diagnosed on 06/18/2021 with right grade I invasive ductal carcinoma breast cancer. It measures 7 mm and is located in the upper outer quadrant. It is ER/PR positive and HER2 negative with a Ki67 of 1%. She has a biopsied positive axillary lymph node. Rt lumpectomy 08/19/21 with 1/3 LN positive.  Radiation completed 11/16/21. Antiestrogen therapy.   PAIN:  Are you having pain? No  PRECAUTIONS: lymphedema risk Rt UE  OCCUPATION: full time Therapist, music   LEISURE: walks, yoga, light weights  PATIENT GOALS learn about pains and try to decrease them   OBJECTIVE PALPATION: No breast fibrosis noted, more of a soft edema, tightness pectoralis, cording noted in axilla 2 cords to mid upper arm  OBSERVATIONS / OTHER ASSESSMENTS: enlarged pores, imprint from gauze and bra on breast, wearing gauze for rawness on Rt nipple.    POSTURE: WNL  UPPER EXTREMITY AROM/PROM:  A/PROM RIGHT   11/23/21  05/24/22 06/15/22  Shoulder extension     Shoulder flexion 165 168 168 - cording evident   Shoulder abduction 165 168 168 - cording evident   Shoulder internal rotation     Shoulder external rotation       (Blank rows = not tested)  Breast edema questionnaire: 24/80; 8/80 on 06/30/22  TODAY'S TREATMENT  Pt permission and consent throughout each step of examination and treatment with modification and draping if requested when working on sensitive areas Date: 07/26/2022 Answered questions regarding treatment  STM Rt pectoralis borders in arm out to the side and arm overhead position and cording release from axilla into upper arm as tolerated.   MLD with focus on the Rt axilla and breast: In supine: Short neck, superficial and deep abdominals, bil axilla and Rt inguinal nodes, anterior inter-axillary and Lt axillo-inguinal anastomosis, and Rt breast focusing on areas of swelling redirecting towards anastomosis.  Redid SOZO which is 0.2 from green but now still in the yellow.  Pt will continue with sleeve wear and return in 1 month for recheck.    Date: 07/21/2022 Answered questions regarding treatment  STM Rt pectoralis borders in arm out to the side and arm overhead position and cording release from axilla into upper arm as tolerated.   MLD with focus on the Rt axilla and breast with instruction on self performance: In supine: Short neck, superficial and deep abdominals, bil axilla and Rt inguinal nodes, anterior inter-axillary and Lt axillo-inguinal anastomosis, and Rt breast focusing on areas of swelling redirecting towards anastomosis.   Date: 07/14/2022 Answered questions regarding treatment  STM Rt pectoralis borders in arm out to the side and arm overhead position and cording release from axilla into upper arm as tolerated.   MLD with focus on the Rt axilla and breast with instruction on self performance: In  supine: Short neck, superficial and deep abdominals, bil axilla and Rt inguinal nodes, anterior inter-axillary and Lt axillo-inguinal anastomosis, and Rt breast focusing on areas of swelling redirecting towards anastomosis.   PATIENT EDUCATION:  Education details: cording, POC, breast edema Person educated: Patient Education method: Explanation Education comprehension: verbalized understanding  HOME EXERCISE PROGRAM: Self MLD, doorway stretch, self MLD, has sleeve and gauntlet  ASSESSMENT: CLINICAL IMPRESSION: Will do 1 more visit and continue SOZO recheck in 1 month.   GOALS: Goals reviewed with patient? Yes   LONG TERM GOALS: Target date: 07/05/2022    Pt will decrease breast complaints questionnaire to 14 or less Baseline:  Goal status: MET  2.  Pt will report no pain in the breast with bouncing activities Baseline:  Goal status:  still mild  3.  Pt will report no arm pain in the morning Baseline:  Goal status: smaller and more focused  4.  Pt will be ind with self MLD for the breast and arm Baseline:  Goal status: MET     PLAN: PT FREQUENCY: 1x/week due to work schedule  PT DURATION: 6 weeks  PLANNED INTERVENTIONS: Therapeutic exercises, Patient/Family education, Self Care, Manual therapy, and Re-evaluation  PLAN FOR NEXT SESSION: Rt breast and upper arm MLD with education as able   Stark Bray, PT 07/26/2022, 8:51 AM

## 2022-08-02 ENCOUNTER — Ambulatory Visit: Payer: BC Managed Care – PPO | Admitting: Rehabilitation

## 2022-08-02 ENCOUNTER — Encounter: Payer: Self-pay | Admitting: Rehabilitation

## 2022-08-02 DIAGNOSIS — R293 Abnormal posture: Secondary | ICD-10-CM

## 2022-08-02 DIAGNOSIS — Z483 Aftercare following surgery for neoplasm: Secondary | ICD-10-CM | POA: Diagnosis not present

## 2022-08-02 DIAGNOSIS — R600 Localized edema: Secondary | ICD-10-CM

## 2022-08-02 DIAGNOSIS — Z170 Estrogen receptor positive status [ER+]: Secondary | ICD-10-CM

## 2022-08-02 NOTE — Therapy (Signed)
OUTPATIENT PHYSICAL THERAPY ONCOLOGY TREATMENT  Patient Name: Tonya Haynes MRN: [id number] DOB:Feb 28, 1961, 61 y.o., female Today's Date: 08/02/2022   PT End of Session - 08/02/22 0800     Visit Number 14    Number of Visits 14    Date for PT Re-Evaluation 08/04/22    PT Start Time 0803    PT Stop Time 0845    PT Time Calculation (min) 42 min    Activity Tolerance Patient tolerated treatment well    Behavior During Therapy [facility] for tasks assessed/performed                   Past Medical History:  Diagnosis Date   Anxiety    Asthma    Breast cancer ([place])    Diastolic dysfunction    History of basal cell carcinoma    Hypertension    LBBB (left bundle branch block)    Unspecified asthma(493.90)     Problem list entry automatically replaced. Please review for accuracy.   Past Surgical History:  Procedure Laterality Date   AXILLARY SENTINEL NODE BIOPSY Right 08/19/2021   Procedure: AXILLARY SENTINEL NODE BIOPSY;  Surgeon: Donnie Mesa, MD;  Location: [place];  Service: General;  Laterality: Right;   BASAL CELL CARCINOMA EXCISION     RIGHT DELTOID MUSCLE   BREAST BIOPSY Right 08/11/2021   BREAST LUMPECTOMY WITH RADIOACTIVE SEED AND SENTINEL LYMPH NODE BIOPSY Right 08/19/2021   Procedure: RIGHT BREAST LUMPECTOMY WITH RADIOACTIVE SEED X2;  Surgeon: Donnie Mesa, MD;  Location: [place];  Service: General;  Laterality: Right;  120 MINUTES ROOM 1   RADIOACTIVE SEED GUIDED AXILLARY SENTINEL LYMPH NODE Right 08/19/2021   Procedure: RADIOACTIVE SEED GUIDED RIGHT  AXILLARY SENTINEL LYMPH NODE DISSECTION;  Surgeon: Donnie Mesa, MD;  Location: [place];  Service: General;  Laterality: Right;   RE-EXCISION OF BREAST LUMPECTOMY Right 09/08/2021   Procedure: RE-EXCISION OF INFERIOR MARGIN RIGHT BREAST LUMPECTOMY;  Surgeon: Donnie Mesa, MD;  Location: [place];  Service: General;  Laterality: Right;   Patient Active Problem List   Diagnosis Date Noted    Genetic testing 08/06/2021   Family history of breast cancer 07/29/2021   Asthma 07/27/2021   Malignant neoplasm of upper-outer quadrant of right breast in female, estrogen receptor positive ([place]) 07/23/2021   Essential hypertension, malignant 12/12/2016   Hypertension    LBBB (left bundle branch block)    Anxiety    Unspecified HTXHFS(142.39)    Diastolic dysfunction    History of basal cell carcinoma     PCP: Harlan Stains , MD  REFERRING PROVIDER: Dr. Nicholas Lose  REFERRING DIAG: right breast cancer      THERAPY DIAG:  Malignant neoplasm of upper-outer quadrant of right breast in female, estrogen receptor positive ([place])  Abnormal posture  Localized edema  Aftercare following surgery for neoplasm  ONSET DATE: 08/19/21   Rationale for Evaluation and Treatment Rehabilitation  SUBJECTIVE  SUBJECTIVE STATEMENT:  I feel ready for graduation  PERTINENT HISTORY:  Patient was diagnosed on 06/18/2021 with right grade I invasive ductal carcinoma breast cancer. It measures 7 mm and is located in the upper outer quadrant. It is ER/PR positive and HER2 negative with a Ki67 of 1%. She has a biopsied positive axillary lymph node. Rt lumpectomy 08/19/21 with 1/3 LN positive.  Radiation completed 11/16/21. Antiestrogen therapy.   PAIN:  Are you having pain? No  PRECAUTIONS: lymphedema risk Rt UE  OCCUPATION: full time Therapist, music   LEISURE: walks, yoga, light weights  PATIENT GOALS learn about pains and try to decrease them   OBJECTIVE PALPATION: No breast fibrosis noted, more of a soft edema, tightness pectoralis, cording noted in axilla 2 cords to mid upper arm  OBSERVATIONS / OTHER ASSESSMENTS: enlarged pores, imprint from gauze and bra on breast, wearing gauze for rawness on Rt  nipple.   POSTURE: WNL  UPPER EXTREMITY AROM/PROM:  A/PROM RIGHT   11/23/21  05/24/22 06/15/22 08/02/22  Shoulder extension      Shoulder flexion 165 168 168 - cording evident  168  Shoulder abduction 165 168 168 - cording evident  168  Shoulder internal rotation      Shoulder external rotation        (Blank rows = not tested)  Breast edema questionnaire: 24/80; 8/80 on 06/30/22, 8/80 on 08/02/22.  TODAY'S TREATMENT  Pt permission and consent throughout each step of examination and treatment with modification and draping if requested when working on sensitive areas Date: 08/02/2022 Answered questions regarding treatment  STM Rt pectoralis borders in arm out to the side and arm overhead position and cording release from axilla into upper arm as tolerated.   MLD with focus on the Rt axilla and breast: In supine: Short neck, superficial and deep abdominals, bil axilla and Rt inguinal nodes, anterior inter-axillary and Lt axillo-inguinal anastomosis, and Rt breast focusing on areas of swelling redirecting towards anastomosis.   Date: 07/26/2022 Answered questions regarding treatment  STM Rt pectoralis borders in arm out to the side and arm overhead position and cording release from axilla into upper arm as tolerated.   MLD with focus on the Rt axilla and breast: In supine: Short neck, superficial and deep abdominals, bil axilla and Rt inguinal nodes, anterior inter-axillary and Lt axillo-inguinal anastomosis, and Rt breast focusing on areas of swelling redirecting towards anastomosis.  Redid SOZO which is 0.2 from green but now still in the yellow.  Pt will continue with sleeve wear and return in 1 month for recheck.    Date: 07/21/2022 Answered questions regarding treatment  STM Rt pectoralis borders in arm out to the side and arm overhead position and cording release from axilla into upper arm as tolerated.   MLD with focus on the Rt axilla and breast with instruction on self performance:  In supine: Short neck, superficial and deep abdominals, bil axilla and Rt inguinal nodes, anterior inter-axillary and Lt axillo-inguinal anastomosis, and Rt breast focusing on areas of swelling redirecting towards anastomosis.   Date: 07/14/2022 Answered questions regarding treatment  STM Rt pectoralis borders in arm out to the side and arm overhead position and cording release from axilla into upper arm as tolerated.   MLD with focus on the Rt axilla and breast with instruction on self performance: In supine: Short neck, superficial and deep abdominals, bil axilla and Rt inguinal nodes, anterior inter-axillary and Lt axillo-inguinal anastomosis, and Rt breast focusing on areas of swelling  redirecting towards anastomosis.   PATIENT EDUCATION:  Education details: cording, POC, breast edema Person educated: Patient Education method: Explanation Education comprehension: verbalized understanding  HOME EXERCISE PROGRAM: Self MLD, doorway stretch, self MLD, has sleeve and gauntlet  ASSESSMENT: CLINICAL IMPRESSION: Pt is ready for DC with goals met and now full participation in the [facility] program.  Pt has made great progress and knows she can return at any time.   GOALS: Goals reviewed with patient? Yes   LONG TERM GOALS: Target date: 07/05/2022    Pt will decrease breast complaints questionnaire to 14 or less Baseline:  Goal status: MET  2.  Pt will report no pain in the breast with bouncing activities Baseline:  Goal status:  MET  3.  Pt will report no arm pain in the morning Baseline:MOSTLY MET Goal status: smaller and more focused - more of "pinpricks" lasting very short period of time  4.  Pt will be ind with self MLD for the breast and arm Baseline:  Goal status: MET     PLAN: PT FREQUENCY: 1x/week due to work schedule  PT DURATION: 6 weeks  PLANNED INTERVENTIONS: Therapeutic exercises, Patient/Family education, Self Care, Manual therapy, and Re-evaluation  PLAN  FOR NEXT SESSION: Rt breast and upper arm MLD with education as able   Stark Bray, PT 08/02/2022, 9:06 AM

## 2022-08-29 ENCOUNTER — Ambulatory Visit: Payer: BC Managed Care – PPO | Attending: Hematology and Oncology

## 2022-08-29 VITALS — Wt 179.2 lb

## 2022-08-29 DIAGNOSIS — Z483 Aftercare following surgery for neoplasm: Secondary | ICD-10-CM | POA: Insufficient documentation

## 2022-08-29 NOTE — Therapy (Signed)
OUTPATIENT PHYSICAL THERAPY SOZO SCREENING NOTE   Patient Name: Tonya Haynes MRN: [id number] DOB:Mar 15, 1961, 61 y.o., female Today's Date: 08/29/2022  PCP: Harlan Stains, MD REFERRING PROVIDER: Nicholas Lose, MD   PT End of Session - 08/29/22 0177     Visit Number 14   # unchanged due to screen only   PT Start Time 0810    PT Stop Time 0817    PT Time Calculation (min) 7 min    Activity Tolerance Patient tolerated treatment well    Behavior During Therapy WFL for tasks assessed/performed             Past Medical History:  Diagnosis Date   Anxiety    Asthma    Breast cancer ([place])    Diastolic dysfunction    History of basal cell carcinoma    Hypertension    LBBB (left bundle branch block)    Unspecified asthma(493.90)     Problem list entry automatically replaced. Please review for accuracy.   Past Surgical History:  Procedure Laterality Date   AXILLARY SENTINEL NODE BIOPSY Right 08/19/2021   Procedure: AXILLARY SENTINEL NODE BIOPSY;  Surgeon: Donnie Mesa, MD;  Location: [facility];  Service: General;  Laterality: Right;   BASAL CELL CARCINOMA EXCISION     RIGHT DELTOID MUSCLE   BREAST BIOPSY Right 08/11/2021   BREAST LUMPECTOMY WITH RADIOACTIVE SEED AND SENTINEL LYMPH NODE BIOPSY Right 08/19/2021   Procedure: RIGHT BREAST LUMPECTOMY WITH RADIOACTIVE SEED X2;  Surgeon: Donnie Mesa, MD;  Location: [facility];  Service: General;  Laterality: Right;  120 MINUTES ROOM 1   RADIOACTIVE SEED GUIDED AXILLARY SENTINEL LYMPH NODE Right 08/19/2021   Procedure: RADIOACTIVE SEED GUIDED RIGHT  AXILLARY SENTINEL LYMPH NODE DISSECTION;  Surgeon: Donnie Mesa, MD;  Location: [facility];  Service: General;  Laterality: Right;   RE-EXCISION OF BREAST LUMPECTOMY Right 09/08/2021   Procedure: RE-EXCISION OF INFERIOR MARGIN RIGHT BREAST LUMPECTOMY;  Surgeon: Donnie Mesa, MD;  Location: [facility];  Service: General;  Laterality: Right;   Patient Active Problem List    Diagnosis Date Noted   Genetic testing 08/06/2021   Family history of breast cancer 07/29/2021   Asthma 07/27/2021   Malignant neoplasm of upper-outer quadrant of right breast in female, estrogen receptor positive ([place]) 07/23/2021   Essential hypertension, malignant 12/12/2016   Hypertension    LBBB (left bundle branch block)    Anxiety    Unspecified LTJQZE(092.33)    Diastolic dysfunction    History of basal cell carcinoma     REFERRING DIAG: right breast cancer at risk for lymphedema  THERAPY DIAG: Aftercare following surgery for neoplasm  PERTINENT HISTORY: Patient was diagnosed on 06/18/2021 with right grade I invasive ductal carcinoma breast cancer. It measures 7 mm and is located in the upper outer quadrant. It is ER/PR positive and HER2 negative with a Ki67 of 1%. She has a biopsied positive axillary lymph node. Rt lumpectomy 08/19/21 with 1/3 LN positive.  Radiation completed 11/16/21. Antiestrogen therapy   PRECAUTIONS: right UE Lymphedema risk, None  SUBJECTIVE: Pt returns for her 1 month follow up with SOZO as her changes from baseline for past 2 months have been elevated determining subclinical lymphedema.   PAIN:  Are you having pain? No  SOZO SCREENING: Patient was assessed today using the SOZO machine to determine the lymphedema index score. This was compared to her baseline score. It was determined that she is within the recommended range when compared to her baseline and no further  action is needed at this time. She will continue SOZO screenings. These are done every 3 months for 2 years post operatively followed by every 6 months for 2 years, and then annually.  Pts change from baseline is normal now but we are going to cont with one more follow up in 1 omnth since her change from baseline has been elevated for about 2 months. Pt is agreeable to this and also wanted to come back in 1 month to see if her weaning off slowly wearing the sleeve all day is going okay.    L-DEX FLOWSHEETS - 08/29/22 0800       L-DEX LYMPHEDEMA SCREENING   Measurement Type Unilateral    L-DEX MEASUREMENT EXTREMITY Upper Extremity    POSITION  Standing    DOMINANT SIDE Right    At Risk Side Right    BASELINE SCORE (UNILATERAL) -2.6    L-DEX SCORE (UNILATERAL) 1.3    VALUE CHANGE (UNILAT) 3.9              Otelia Limes, PTA 08/29/2022, 8:19 AM

## 2022-09-18 ENCOUNTER — Encounter: Payer: Self-pay | Admitting: Cardiovascular Disease

## 2022-09-18 NOTE — Progress Notes (Unsigned)
[name] Date of Birth  [birth year] [facility] / [facility] [address] [place], [place]  [address] [phone]  Fax  [phone]    [name] is a middle-aged female who I have been seeing for hypertension. She's done very well. Her last echocardiogram shows normal left ventricular systolic function. She did have moderate left ventricular hypertrophy but this has improved slightly and now she only has mild left ventricular hypertrophy.  She is able to do all of her normal activities without any significant problems.  Sept. 2, 2016:  Doing well. No cp, no dyspnea. No complaints.  Not much exercise.  Planning on starting soon. Has been check ing her BP  - readings are all normal  125 / 72   She is very anxious today.  December 12, 2016:  [name] is seen today for follow up visit  BP at home is good.    Last echo in 2016 shows normal LV function .  Is planning on walking some   Walking regularly ,   Lipids look great   Feb 12, 2018:  [name] is seen today for follow up of her HTN,LBBB and CHF Doing well .    BP has been good .   No cp , breathing is good .   Sept. 28, 2020  [name] is seen for follow up of her HTN , LBBB  Grade 1 diastolic CHF Works is going ok  Does not exercise much  Does not like to sweat.   Dec. 17, 2020  [name] is seen today for follow up visit. Is taking Bystolic 10 mg a day , Has not started the HCTZ and KCl . Is walking some - walks 45 min. 3 days a week.    No CP or dyspnea.   We will start the hydrochlorothiazide and potassium today.  She will continue to monitor blood pressure readings.  We will check a basic metabolic profile in 3 weeks and I will see her again in 3 months.  December 23, 2019:  [name] is seen today for follow-up visit for hypertension, left bundle branch block.  We added HCTZ and potassium during her last office visit. Is walking . Feels great.   No CP    Oct. 3,  2022 [name] is seen today for follow up of her HTN and LBBB Her last echo was ih Oct. 2016 -  normal LV systolic function - EF 29-79%, grade 1 DD Mild MR  Is walking some  We discussed regular exercise  January 05, 2022 [name] is seen today for follow up of her HTN, LBBB, HLD  She was diagnosed with R breast cancer since I last saw her She has done very well since I last saw her from an exercise, diet standpoint Wt today is 182 lbs. ( Down 30 lbs from previous visit )   Walking  - does not particulally enjoy it   Her home blood pressure readings look great.   March 31, 2022 [name] is seen today for follow up visit Recent lipids look great She has been walking regularly  Wt. Is 178  ( down 4 lbs from last visit)  The mag trace is fading slowly.    Has some tenderness along the R side of her R breast .  Not to the point that it is worrisome.     Is enjoying walking now. Looks forward to walking now .   She had some questions about  letrozole ( Femara)  Apparently it can cause some cholesterol elevations in post menapausal women.  We discussed the fact that she is getting significant benefit from the Femara and that any slight increased of cholesterol at this time would likely not be significant    Dec. 18, 2023  [name] is seen today for follow up of her HTN, LBBB , HLD  Hx of R breast cancer , s/p lumpectomy and XRT .   Home BP readings look great ,  120s / 68  Going to the gym regularly .  Is working out 2-3 a week  Exercises        Current Outpatient Medications on File Prior to Visit  Medication Sig Dispense Refill   acetaminophen (TYLENOL) 500 MG tablet Take 1,000 mg by mouth every 6 (six) hours as needed for moderate pain.     fexofenadine (ALLEGRA) 180 MG tablet Take 180 mg by mouth daily as needed for allergies or rhinitis.     hydrochlorothiazide (MICROZIDE) 12.5 MG capsule Take 1 capsule (12.5 mg total) by mouth daily. 90 capsule 3   letrozole (FEMARA) 2.5 MG  tablet TAKE 1 TABLET(2.5 MG) BY MOUTH DAILY 90 tablet 3   lisinopril (ZESTRIL) 10 MG tablet TAKE 1 TABLET(10 MG) BY MOUTH DAILY 90 tablet 3   Multiple Minerals-Vitamins (CITRACAL MAXIMUM PLUS) TABS '650mg'$  Calcium, 1000 IU Vitamin D daily     Multiple Vitamin (MULTI-VITAMIN PO) Take 1 capsule by mouth daily.      nebivolol (BYSTOLIC) 10 MG tablet Take 1 tablet (10 mg total) by mouth daily. 90 tablet 2   potassium chloride (KLOR-CON) 10 MEQ tablet TAKE 1 TABLET(10 MEQ) BY MOUTH DAILY 90 tablet 3   rosuvastatin (CRESTOR) 5 MG tablet Take 1 tablet (5 mg total) by mouth daily. 90 tablet 3   Triamcinolone Acetonide (NASACORT ALLERGY 24HR NA) Place 1 spray into the nose daily as needed.     No current facility-administered medications on file prior to visit.    Allergies  Allergen Reactions   Chlorhexidine Gluconate Rash    Rash with CHG soap and wipes    Past Medical History:  Diagnosis Date   Anxiety    Asthma    Breast cancer ([place])    Diastolic dysfunction    History of basal cell carcinoma    Hypertension    LBBB (left bundle branch block)    Unspecified asthma(493.90)     Problem list entry automatically replaced. Please review for accuracy.    Past Surgical History:  Procedure Laterality Date   AXILLARY SENTINEL NODE BIOPSY Right 08/19/2021   Procedure: AXILLARY SENTINEL NODE BIOPSY;  Surgeon: Donnie Mesa, MD;  Location: [facility];  Service: General;  Laterality: Right;   BASAL CELL CARCINOMA EXCISION     RIGHT DELTOID MUSCLE   BREAST BIOPSY Right 08/11/2021   BREAST LUMPECTOMY WITH RADIOACTIVE SEED AND SENTINEL LYMPH NODE BIOPSY Right 08/19/2021   Procedure: RIGHT BREAST LUMPECTOMY WITH RADIOACTIVE SEED X2;  Surgeon: Donnie Mesa, MD;  Location: [facility];  Service: General;  Laterality: Right;  120 MINUTES ROOM 1   RADIOACTIVE SEED GUIDED AXILLARY SENTINEL LYMPH NODE Right 08/19/2021   Procedure: RADIOACTIVE SEED GUIDED RIGHT  AXILLARY SENTINEL LYMPH NODE DISSECTION;  Surgeon:  Donnie Mesa, MD;  Location: [facility];  Service: General;  Laterality: Right;   RE-EXCISION OF BREAST LUMPECTOMY Right 09/08/2021   Procedure: RE-EXCISION OF INFERIOR MARGIN RIGHT BREAST LUMPECTOMY;  Surgeon: Donnie Mesa, MD;  Location: [facility];  Service:  General;  Laterality: Right;    Social History   Tobacco Use  Smoking Status Never  Smokeless Tobacco Never    Social History   Substance and Sexual Activity  Alcohol Use Yes   Comment: Social    Family History  Problem Relation Age of Onset   Breast cancer Mother 104   Fibromyalgia Mother    Hypertension Father    Lung cancer Maternal Uncle        smoked   Stomach cancer Paternal Uncle        dx. 7s    Reviw of Systems:  Reviewed in the HPI.  All other systems are negative.  Physical Exam: Blood pressure 134/78, pulse 82, height '5\' 8"'$  (1.727 m), weight 179 lb 3.2 oz (81.3 kg), last menstrual period 04/23/2011, SpO2 99 %.       GEN:  Well nourished, well developed in no acute distress HEENT: Normal NECK: No JVD; No carotid bruits LYMPHATICS: No lymphadenopathy CARDIAC: RRR soft systolic murmur .   Breast : R breast mag trace staining is clearly fading  RESPIRATORY:  Clear to auscultation without rales, wheezing or rhonchi  ABDOMEN: Soft, non-tender, non-distended MUSCULOSKELETAL:  No edema; No deformity  SKIN: Warm and dry NEUROLOGIC:  Alert and oriented x 3    EKG:    September 19, 2022: Normal sinus rhythm at 82.  Left bundle branch block.  QRS duration is 170 ms.  QRS is slightly longer than previous EKG.  Assessment / Plan:   1. Hypertension: Blood pressure is very well-controlled.  Continue current medications.    2. Left bundle branch block: QRS is slightly longer than previous.  Will consider getting an echocardiogram next year      3.  Hyperlipidemia:   Lipids look great.  Will recheck again in 6 months.  4.  Breast cancer:  .  Stable.      Mertie Moores, MD  09/19/2022  9:44 AM    [address],  [place], [place]  [address] Pager [phone] Phone: [phone]; Fax: [phone]

## 2022-09-19 ENCOUNTER — Encounter: Payer: Self-pay | Admitting: Cardiovascular Disease

## 2022-09-19 ENCOUNTER — Ambulatory Visit: Payer: BC Managed Care – PPO | Attending: Cardiovascular Disease | Admitting: Cardiovascular Disease

## 2022-09-19 VITALS — BP 134/78 | HR 82 | Ht 68.0 in | Wt 179.2 lb

## 2022-09-19 DIAGNOSIS — I10 Essential (primary) hypertension: Secondary | ICD-10-CM | POA: Diagnosis not present

## 2022-09-19 DIAGNOSIS — I447 Left bundle-branch block, unspecified: Secondary | ICD-10-CM | POA: Diagnosis not present

## 2022-09-19 NOTE — Patient Instructions (Signed)
Medication Instructions:  Your physician recommends that you continue on your current medications as directed. Please refer to the Current Medication list given to you today.  *If you need a refill on your cardiac medications before your next appointment, please call your pharmacy*   Lab Work: Lipids, ALT, BMET (in 6 mos prior to appt) If you have labs (blood work) drawn today and your tests are completely normal, you will receive your results only by: [facility] (if you have MyChart) OR A paper copy in the mail If you have any lab test that is abnormal or we need to change your treatment, we will call you to review the results.   Testing/Procedures: NONE   Follow-Up: At [facility], you and your health needs are our priority.  As part of our continuing mission to provide you with exceptional heart care, we have created designated Provider Care Teams.  These Care Teams include your primary Cardiologist (physician) and Advanced Practice Providers (APPs -  Physician Assistants and Nurse Practitioners) who all work together to provide you with the care you need, when you need it.  We recommend signing up for the patient portal called "MyChart".  Sign up information is provided on this After Visit Summary.  MyChart is used to connect with patients for Virtual Visits (Telemedicine).  Patients are able to view lab/test results, encounter notes, upcoming appointments, etc.  Non-urgent messages can be sent to your provider as well.   To learn more about what you can do with MyChart, go to [email or web address].    Your next appointment:   6 month(s)  The format for your next appointment:   In Person  Provider:   Mertie Moores, MD       Important Information About Sugar

## 2022-09-22 ENCOUNTER — Telehealth: Payer: Self-pay | Admitting: Cardiovascular Disease

## 2022-09-22 DIAGNOSIS — I447 Left bundle-branch block, unspecified: Secondary | ICD-10-CM

## 2022-09-22 NOTE — Telephone Encounter (Signed)
Per secure chat by Dr Acie Fredrickson:  please order an echo . LBBB with widening QRS   Echo order placed at this time and scheduled for 09/30/22.

## 2022-09-30 ENCOUNTER — Ambulatory Visit (HOSPITAL_COMMUNITY)
Admission: RE | Admit: 2022-09-30 | Discharge: 2022-09-30 | Disposition: A | Discharge status: Home / Self Care | Payer: BC Managed Care – PPO | Source: Ambulatory Visit | Attending: Cardiovascular Disease | Admitting: Cardiovascular Disease

## 2022-09-30 DIAGNOSIS — C50919 Malignant neoplasm of unspecified site of unspecified female breast: Secondary | ICD-10-CM | POA: Diagnosis not present

## 2022-09-30 DIAGNOSIS — I119 Hypertensive heart disease without heart failure: Secondary | ICD-10-CM | POA: Insufficient documentation

## 2022-09-30 DIAGNOSIS — F419 Anxiety disorder, unspecified: Secondary | ICD-10-CM | POA: Diagnosis not present

## 2022-09-30 DIAGNOSIS — I447 Left bundle-branch block, unspecified: Secondary | ICD-10-CM | POA: Diagnosis present

## 2022-09-30 DIAGNOSIS — I081 Rheumatic disorders of both mitral and tricuspid valves: Secondary | ICD-10-CM | POA: Diagnosis not present

## 2022-09-30 LAB — ECHOCARDIOGRAM COMPLETE
Area-P 1/2: 2.91 cm2
Calc EF: 59.2 %
MV VTI: 3.29 cm2
S' Lateral: 2.9 cm
Single Plane A2C EF: 56.6 %
Single Plane A4C EF: 62 %

## 2022-09-30 NOTE — Progress Notes (Incomplete)
{  Select Note:[id number]} 

## 2022-09-30 NOTE — Progress Notes (Signed)
  Echocardiogram 2D Echocardiogram has been performed.  Tonya Haynes 09/30/2022, 12:19 PM

## 2022-10-10 ENCOUNTER — Ambulatory Visit: Payer: BC Managed Care – PPO | Attending: Hematology and Oncology

## 2022-10-10 VITALS — Wt 178.2 lb

## 2022-10-10 DIAGNOSIS — Z483 Aftercare following surgery for neoplasm: Secondary | ICD-10-CM | POA: Insufficient documentation

## 2022-10-10 NOTE — Therapy (Signed)
OUTPATIENT PHYSICAL THERAPY [facility] SCREENING NOTE   Patient Name: Tonya Haynes MRN: [id number] DOB:1960/11/10, 62 y.o., female Today's Date: 10/10/2022  PCP: Harlan Stains, MD REFERRING PROVIDER: Nicholas Lose, MD   PT End of Session - 10/10/22 6160     Visit Number 14   # unchanged due to screen only   PT Start Time 0823    PT Stop Time 0828    PT Time Calculation (min) 5 min    Activity Tolerance Patient tolerated treatment well    Behavior During Therapy WFL for tasks assessed/performed             Past Medical History:  Diagnosis Date   Anxiety    Asthma    Breast cancer ([place])    Diastolic dysfunction    History of basal cell carcinoma    Hypertension    LBBB (left bundle branch block)    Unspecified asthma(493.90)     Problem list entry automatically replaced. Please review for accuracy.   Past Surgical History:  Procedure Laterality Date   AXILLARY SENTINEL NODE BIOPSY Right 08/19/2021   Procedure: AXILLARY SENTINEL NODE BIOPSY;  Surgeon: Donnie Mesa, MD;  Location: [facility];  Service: General;  Laterality: Right;   BASAL CELL CARCINOMA EXCISION     RIGHT DELTOID MUSCLE   BREAST BIOPSY Right 08/11/2021   BREAST LUMPECTOMY WITH RADIOACTIVE SEED AND SENTINEL LYMPH NODE BIOPSY Right 08/19/2021   Procedure: RIGHT BREAST LUMPECTOMY WITH RADIOACTIVE SEED X2;  Surgeon: Donnie Mesa, MD;  Location: [facility];  Service: General;  Laterality: Right;  120 MINUTES ROOM 1   RADIOACTIVE SEED GUIDED AXILLARY SENTINEL LYMPH NODE Right 08/19/2021   Procedure: RADIOACTIVE SEED GUIDED RIGHT  AXILLARY SENTINEL LYMPH NODE DISSECTION;  Surgeon: Donnie Mesa, MD;  Location: [facility];  Service: General;  Laterality: Right;   RE-EXCISION OF BREAST LUMPECTOMY Right 09/08/2021   Procedure: RE-EXCISION OF INFERIOR MARGIN RIGHT BREAST LUMPECTOMY;  Surgeon: Donnie Mesa, MD;  Location: [facility];  Service: General;  Laterality: Right;   Patient Active Problem List    Diagnosis Date Noted   Genetic testing 08/06/2021   Family history of breast cancer 07/29/2021   Asthma 07/27/2021   Malignant neoplasm of upper-outer quadrant of right breast in female, estrogen receptor positive ([place]) 07/23/2021   Essential hypertension, malignant 12/12/2016   Hypertension    LBBB (left bundle branch block)    Anxiety    Unspecified VPXTGG(269.48)    Diastolic dysfunction    History of basal cell carcinoma     REFERRING DIAG: right breast cancer at risk for lymphedema  THERAPY DIAG: Aftercare following surgery for neoplasm  PERTINENT HISTORY: Patient was diagnosed on 06/18/2021 with right grade I invasive ductal carcinoma breast cancer. It measures 7 mm and is located in the upper outer quadrant. It is ER/PR positive and HER2 negative with a Ki67 of 1%. She has a biopsied positive axillary lymph node. Rt lumpectomy 08/19/21 with 1/3 LN positive.  Radiation completed 11/16/21. Antiestrogen therapy   PRECAUTIONS: right UE Lymphedema risk, None  SUBJECTIVE: Pt returns for a 1 month L-dex screen. She didn't want to wait 3 months as she has been elevated >6.5 3 different times.   PAIN:  Are you having pain? No  SOZO SCREENING: Patient was assessed today using the SOZO machine to determine the lymphedema index score. This was compared to her baseline score. It was determined that she is within the recommended range when compared to her baseline and no further action  is needed at this time. She will continue SOZO screenings. These are done every 3 months for 2 years post operatively followed by every 6 months for 2 years, and then annually.    L-DEX FLOWSHEETS - 10/10/22 0800       L-DEX LYMPHEDEMA SCREENING   Measurement Type Unilateral    L-DEX MEASUREMENT EXTREMITY Upper Extremity    POSITION  Standing    DOMINANT SIDE Right    At Risk Side Right    BASELINE SCORE (UNILATERAL) -2.6    L-DEX SCORE (UNILATERAL) 2.6    VALUE CHANGE (UNILAT) 5.2               Otelia Limes, PTA 10/10/2022, 8:29 AM

## 2022-12-09 ENCOUNTER — Encounter: Payer: Self-pay | Admitting: Rehabilitation

## 2022-12-10 ENCOUNTER — Other Ambulatory Visit: Payer: Self-pay | Admitting: Cardiovascular Disease

## 2022-12-10 DIAGNOSIS — I10 Essential (primary) hypertension: Secondary | ICD-10-CM

## 2022-12-10 DIAGNOSIS — I5189 Other ill-defined heart diseases: Secondary | ICD-10-CM

## 2022-12-12 ENCOUNTER — Other Ambulatory Visit: Payer: Self-pay | Admitting: *Deleted

## 2022-12-12 MED ORDER — HYDROCHLOROTHIAZIDE 12.5 MG PO CAPS: 12.5000 mg | ORAL_CAPSULE | Freq: Every day | ORAL | 0 refills | Status: DC

## 2022-12-13 ENCOUNTER — Encounter: Payer: Self-pay | Admitting: Rehabilitation

## 2022-12-13 ENCOUNTER — Other Ambulatory Visit: Payer: Self-pay | Admitting: *Deleted

## 2022-12-13 ENCOUNTER — Ambulatory Visit: Payer: BC Managed Care – PPO | Attending: Hematology and Oncology | Admitting: Rehabilitation

## 2022-12-13 DIAGNOSIS — C50411 Malignant neoplasm of upper-outer quadrant of right female breast: Secondary | ICD-10-CM | POA: Diagnosis present

## 2022-12-13 DIAGNOSIS — Z483 Aftercare following surgery for neoplasm: Secondary | ICD-10-CM | POA: Insufficient documentation

## 2022-12-13 DIAGNOSIS — Z170 Estrogen receptor positive status [ER+]: Secondary | ICD-10-CM | POA: Insufficient documentation

## 2022-12-13 DIAGNOSIS — R293 Abnormal posture: Secondary | ICD-10-CM | POA: Insufficient documentation

## 2022-12-13 DIAGNOSIS — R600 Localized edema: Secondary | ICD-10-CM | POA: Diagnosis present

## 2022-12-13 NOTE — Therapy (Signed)
OUTPATIENT PHYSICAL THERAPY ONCOLOGY RE-EVALUATION  Patient Name: Tonya Haynes MRN: [id number] DOB:[birth year], 62 y.o., female Today's Date: 12/13/2022   PT End of Session - 12/13/22 0844     Visit Number 15    Number of Visits 15    Date for PT Re-Evaluation 12/13/22    PT Start Time 0804    PT Stop Time 0844    PT Time Calculation (min) 40 min    Activity Tolerance Patient tolerated treatment well    Behavior During Therapy WFL for tasks assessed/performed              Past Medical History:  Diagnosis Date   Anxiety    Asthma    Breast cancer ([place])    Diastolic dysfunction    History of basal cell carcinoma    Hypertension    LBBB (left bundle branch block)    Unspecified asthma(493.90)     Problem list entry automatically replaced. Please review for accuracy.   Past Surgical History:  Procedure Laterality Date   AXILLARY SENTINEL NODE BIOPSY Right 08/19/2021   Procedure: AXILLARY SENTINEL NODE BIOPSY;  Surgeon: Donnie Mesa, MD;  Location: [facility];  Service: General;  Laterality: Right;   BASAL CELL CARCINOMA EXCISION     RIGHT DELTOID MUSCLE   BREAST BIOPSY Right 08/11/2021   BREAST LUMPECTOMY WITH RADIOACTIVE SEED AND SENTINEL LYMPH NODE BIOPSY Right 08/19/2021   Procedure: RIGHT BREAST LUMPECTOMY WITH RADIOACTIVE SEED X2;  Surgeon: Donnie Mesa, MD;  Location: [facility];  Service: General;  Laterality: Right;  120 MINUTES ROOM 1   RADIOACTIVE SEED GUIDED AXILLARY SENTINEL LYMPH NODE Right 08/19/2021   Procedure: RADIOACTIVE SEED GUIDED RIGHT  AXILLARY SENTINEL LYMPH NODE DISSECTION;  Surgeon: Donnie Mesa, MD;  Location: [facility];  Service: General;  Laterality: Right;   RE-EXCISION OF BREAST LUMPECTOMY Right 09/08/2021   Procedure: RE-EXCISION OF INFERIOR MARGIN RIGHT BREAST LUMPECTOMY;  Surgeon: Donnie Mesa, MD;  Location: [facility];  Service: General;  Laterality: Right;   Patient Active Problem List   Diagnosis Date Noted   Genetic  testing 08/06/2021   Family history of breast cancer 07/29/2021   Asthma 07/27/2021   Malignant neoplasm of upper-outer quadrant of right breast in female, estrogen receptor positive ([place]) 07/23/2021   Essential hypertension, malignant 12/12/2016   Hypertension    LBBB (left bundle branch block)    Anxiety    Unspecified XX123456)    Diastolic dysfunction    History of basal cell carcinoma     PCP: Harlan Stains , MD  REFERRING PROVIDER: Dr. Nicholas Lose  REFERRING DIAG: right breast cancer      THERAPY DIAG:  Aftercare following surgery for neoplasm  Malignant neoplasm of upper-outer quadrant of right breast in female, estrogen receptor positive ([place])  Abnormal posture  Localized edema  ONSET DATE: 08/19/21   Rationale for Evaluation and Treatment Rehabilitation  SUBJECTIVE  SUBJECTIVE STATEMENT: Around the middle of February I started to notice some starburst like pain randomly near the axilla and upper arm.  I also started noticing heaviness in the breast and some breast pain with speed bumps with bouncing.  Started to notice some trunk swelling.  I would say the breast thing is better now after I started back with more MLD and compression.  I want to see if I should compress my axilla more or do anything different.     PERTINENT HISTORY:  Patient was diagnosed on 06/18/2021 with right grade I invasive ductal carcinoma breast cancer. It measures 7 mm and is located in the upper outer quadrant. It is ER/PR positive and HER2 negative with a Ki67 of 1%. She has a biopsied positive axillary lymph node. Rt lumpectomy 08/19/21 with 1/3 LN positive.  Radiation completed 11/16/21. Antiestrogen therapy.   PAIN:  Are you having pain? Not really  PRECAUTIONS: lymphedema risk Rt UE  WEIGHT  BEARING RESTRICTIONS No  FALLS:  Has patient fallen in last 6 months? No  LIVING ENVIRONMENT: Lives with: lives with their family and lives with their spouse Lives in: House/apartment  OCCUPATION: full time Therapist, music   LEISURE: walks, yoga, light weights  HAND DOMINANCE : right   PRIOR LEVEL OF FUNCTION: Independent  PATIENT GOALS Educational visit    OBJECTIVE  COGNITION:  Overall cognitive status: Within functional limits for tasks assessed   PALPATION: No breast fibrosis noted, more of a soft edema, no cording  OBSERVATIONS / OTHER ASSESSMENTS: still darkened skin from dye injection. No significant differences between Rt and Lt sides in standing or supine. Wearing compression sleeve and bra with foam.   POSTURE: WNL  TODAY'S TREATMENT  Date: 12/13/22 Brief Re-eval Discussion on how pt is doing very well with self care and if she wanted to add anything else she could consider jovi pak axillary pad, andrea tshirt with or without pads.  Also showed pt flexitouch   ASSESSMENT: CLINICAL IMPRESSION: Patient is a 62 y.o. female who was seen today for physical therapy treatment to assess her current self care routine and recheck status after recent increased breast pain which has decreased.  She noticed some increased breast edema and pain with speed bumps but this has resolved after starting to wear her compression sleeve and bra and doing more MLD again.  She is doing very well with her self care at this time and was show a few add on options to consider but most likely does not need them. Will continue SOZOs and let us know if the breast discomfort starts again.   OBJECTIVE IMPAIRMENTS increased edema and pain.   ACTIVITY LIMITATIONS none  PARTICIPATION LIMITATIONS: community activity  PERSONAL FACTORS 1-2 comorbidities: SLNB, radiation history  are also affecting patient's functional outcome.   REHAB POTENTIAL: Excellent  CLINICAL DECISION MAKING:  Evolving/moderate complexity  EVALUATION COMPLEXITY: Moderate  GOALS: Goals reviewed with patient? Yes   SHort TERM GOALS: Target date: 12/13/2022  4.  Pt will be ind with recommended self care for the Rt upper quadrant  Baseline:  Goal status: MET   PLAN: PT FREQUENCY: 1x/week due to work schedule  PT DURATION: 6 weeks  PLANNED INTERVENTIONS: Therapeutic exercises, Patient/Family education, Self Care, Manual therapy, and Re-evaluation  PLAN FOR NEXT SESSION: Cont Ander Slade, PT 12/13/2022, 8:45 AM  PHYSICAL THERAPY DISCHARGE SUMMARY  Visits from Start of Care: 15  Current functional level related to goals / functional outcomes: Ind  with self care - continuing lymphedema surveillance   Remaining deficits: Intermittent breast and axillary edema   Education / Equipment: Compression sleeve, gauntlet, bra, foam pads  Plan: Patient agrees to discharge.  Patient is being discharged due to meeting the stated rehab goals.

## 2022-12-21 ENCOUNTER — Ambulatory Visit (HOSPITAL_COMMUNITY)
Admission: RE | Admit: 2022-12-21 | Discharge: 2022-12-21 | Disposition: A | Discharge status: Home / Self Care | Payer: BC Managed Care – PPO | Source: Ambulatory Visit | Attending: Adult Health | Admitting: Adult Health

## 2022-12-21 ENCOUNTER — Encounter (HOSPITAL_COMMUNITY): Payer: Self-pay | Admitting: Radiology

## 2022-12-21 ENCOUNTER — Telehealth: Payer: Self-pay

## 2022-12-21 DIAGNOSIS — C50411 Malignant neoplasm of upper-outer quadrant of right female breast: Secondary | ICD-10-CM | POA: Diagnosis present

## 2022-12-21 DIAGNOSIS — Z170 Estrogen receptor positive status [ER+]: Secondary | ICD-10-CM | POA: Diagnosis present

## 2022-12-21 DIAGNOSIS — E2839 Other primary ovarian failure: Secondary | ICD-10-CM | POA: Insufficient documentation

## 2022-12-21 MED ORDER — GADOBUTROL 1 MMOL/ML IV SOLN
8.0000 mL | Freq: Once | INTRAVENOUS | Status: AC | PRN
  Administered 2022-12-21: 8 mL via INTRAVENOUS

## 2022-12-21 NOTE — Telephone Encounter (Signed)
-----   Message from Gardenia Phlegm, NP sent at 12/21/2022 11:30 AM EDT ----- No evidence of breast cancer on MRI, please let her know the good news :) ----- Message ----- From: Interface, Rad Results In Sent: 12/21/2022  10:10 AM EDT To: Gardenia Phlegm, NP

## 2022-12-21 NOTE — Telephone Encounter (Signed)
Called pt per NP to review MRI results. She verbalized thanks and understanding.

## 2023-01-09 ENCOUNTER — Ambulatory Visit: Payer: BC Managed Care – PPO | Attending: Hematology and Oncology

## 2023-01-09 VITALS — Wt 185.0 lb

## 2023-01-09 DIAGNOSIS — Z483 Aftercare following surgery for neoplasm: Secondary | ICD-10-CM | POA: Insufficient documentation

## 2023-01-09 NOTE — Therapy (Addendum)
OUTPATIENT PHYSICAL THERAPY SOZO SCREENING NOTE   Patient Name: Tonya Haynes MRN: [id number] DOB:12-28-1960, 62 y.o., female Today's Date: 01/09/2023  PCP: Laurann Montana, MD REFERRING PROVIDER: Serena Croissant, MD   PT End of Session - 01/09/23 0844     Visit Number 15   # unchanged due to screen only   PT Start Time 0842    PT Stop Time 0846    PT Time Calculation (min) 4 min    Activity Tolerance Patient tolerated treatment well    Behavior During Therapy WFL for tasks assessed/performed             Past Medical History:  Diagnosis Date   Anxiety    Asthma    Breast cancer ([facility])    Diastolic dysfunction    History of basal cell carcinoma    Hypertension    LBBB (left bundle branch block)    Unspecified asthma(493.90)     Problem list entry automatically replaced. Please review for accuracy.   Past Surgical History:  Procedure Laterality Date   AXILLARY SENTINEL NODE BIOPSY Right 08/19/2021   Procedure: AXILLARY SENTINEL NODE BIOPSY;  Surgeon: Manus Rudd, MD;  [facility];  Service: General;  Laterality: Right;   BASAL CELL CARCINOMA EXCISION     RIGHT DELTOID MUSCLE   BREAST BIOPSY Right 08/11/2021   BREAST LUMPECTOMY WITH RADIOACTIVE SEED AND SENTINEL LYMPH NODE BIOPSY Right 08/19/2021   Procedure: RIGHT BREAST LUMPECTOMY WITH RADIOACTIVE SEED X2;  Surgeon: Manus Rudd, MD;  [facility];  Service: General;  Laterality: Right;  120 MINUTES ROOM 1   RADIOACTIVE SEED GUIDED AXILLARY SENTINEL LYMPH NODE Right 08/19/2021   Procedure: RADIOACTIVE SEED GUIDED RIGHT  AXILLARY SENTINEL LYMPH NODE DISSECTION;  Surgeon: Manus Rudd, MD;  [facility];  Service: General;  Laterality: Right;   RE-EXCISION OF BREAST LUMPECTOMY Right 09/08/2021   Procedure: RE-EXCISION OF INFERIOR MARGIN RIGHT BREAST LUMPECTOMY;  Surgeon: Manus Rudd, MD;  Location: [facility];  Service: General;  Laterality: Right;   Patient Active Problem List    Diagnosis Date Noted   Genetic testing 08/06/2021   Family history of breast cancer 07/29/2021   Asthma 07/27/2021   Malignant neoplasm of upper-outer quadrant of right breast in female, estrogen receptor positive 07/23/2021   Essential hypertension, malignant 12/12/2016   Hypertension    LBBB (left bundle branch block)    Anxiety    Unspecified asthma(493.90)    Diastolic dysfunction    History of basal cell carcinoma     REFERRING DIAG: Rt breast cancer at risk for lymphedema  THERAPY DIAG: Aftercare following surgery for neoplasm  PERTINENT HISTORY: Patient was diagnosed on 06/18/2021 with right grade I invasive ductal carcinoma breast cancer. It measures 7 mm and is located in the upper outer quadrant. It is ER/PR positive and HER2 negative with a Ki67 of 1%. She has a biopsied positive axillary lymph node. Rt lumpectomy 08/19/21 with 1/3 LN positive.  Radiation completed 11/16/21. Antiestrogen therapy.   PRECAUTIONS: right UE Lymphedema risk, None  SUBJECTIVE: Pt returns for her 3 month L-dex screen. "I've been having some intermittent pain in my Rt axilla."  PAIN:  Are you having pain? No.  SOZO SCREENING: Patient was assessed today using the SOZO machine to determine the lymphedema index score. This was compared to her baseline score. It was determined that she is within the recommended range when compared to her baseline and no further action is needed at this time. She will continue  SOZO screenings. These are done every 3 months for 2 years post operatively followed by every 6 months for 2 years, and then annually.  PT assessed pt after screen today.    L-DEX FLOWSHEETS - 01/09/23 0800       L-DEX LYMPHEDEMA SCREENING   Measurement Type Unilateral    L-DEX MEASUREMENT EXTREMITY Upper Extremity    POSITION  Standing    DOMINANT SIDE Right    At Risk Side Right    BASELINE SCORE (UNILATERAL) -2.6    L-DEX SCORE (UNILATERAL) -3    VALUE CHANGE (UNILAT) -0.4                Hermenia BersRosenberger, Loisann Roach Ann, PTA 01/09/2023, 8:45 AM    Met with pt per her request due to c/o left axillary discomfort. She appears to be holding some fluid in that area which is the space between her compression bra and her compression sleeve. This area has no compression which may be the cause for her swelling. There is no palpable lump or bump. It appears to just be trapped fluid which is not concerning for other pathology. Encouraged her to consider a compression shirt which would include that area. Also discussed the possibility of the Flexitouch pump as she is currently doing manual lymph drainage herself every day. She would like to hold off on that for now but understands this is an option if she changes her mind. We also discussed her use of a backpack during hiking which may be limiting her lymphatic flow. She was encouraged to keep it as light as possible. She knows to call me with any concerns. Bethann PunchesMarti Cooper [place], [place] 01/09/23 9:44 AM

## 2023-03-13 ENCOUNTER — Ambulatory Visit: Payer: BC Managed Care – PPO | Attending: Cardiovascular Disease

## 2023-03-13 ENCOUNTER — Encounter: Payer: Self-pay | Admitting: Cardiovascular Disease

## 2023-03-13 DIAGNOSIS — I447 Left bundle-branch block, unspecified: Secondary | ICD-10-CM

## 2023-03-13 DIAGNOSIS — I10 Essential (primary) hypertension: Secondary | ICD-10-CM

## 2023-03-13 NOTE — Progress Notes (Unsigned)
Tonya Haynes Date of Birth  [birth year] [facility] [address] [place], [place]  [address] [phone]  Fax  [phone]    Tonya Haynes is a middle-aged female who I have been seeing for hypertension. She's done very well. Her last echocardiogram shows normal left ventricular systolic function. She did have moderate left ventricular hypertrophy but this has improved slightly and now she only has mild left ventricular hypertrophy.  She is able to do all of her normal activities without any significant problems.  Sept. 2, 2016:  Doing well. No cp, no dyspnea. No complaints.  Not much exercise.  Planning on starting soon. Has been check ing her BP  - readings are all normal  125 / 72   She is very anxious today.  December 12, 2016:  Tonya Haynes is seen today for follow up visit  BP at home is good.    Last echo in 2016 shows normal LV function .  Is planning on walking some   Walking regularly ,   Lipids look great   Feb 12, 2018:  Tonya Haynes is seen today for follow up of her HTN,LBBB and CHF Doing well .    BP has been good .   No cp , breathing is good .   Sept. 28, 2020  Tonya Haynes is seen for follow up of her HTN , LBBB  Grade 1 diastolic CHF Works is going ok  Does not exercise much  Does not like to sweat.   Dec. 17, 2020  Tonya Haynes is seen today for follow up visit. Is taking Bystolic 10 mg a day , Has not started the HCTZ and KCl . Is walking some - walks 45 min. 3 days a week.    No CP or dyspnea.   We will start the hydrochlorothiazide and potassium today.  She will continue to monitor blood pressure readings.  We will check a basic metabolic profile in 3 weeks and I will see her again in 3 months.  December 23, 2019:  Tonya Haynes is seen today for follow-up visit for hypertension, left bundle branch block.  We added HCTZ and potassium during her last office visit. Is walking . Feels great.   No CP    Oct. 3,  2022 Tonya Haynes is seen today for follow up of her HTN and LBBB Her last echo was ih Oct. 2016 -  normal LV systolic function - EF 29-79%, grade 1 DD Mild MR  Is walking some  We discussed regular exercise  January 05, 2022 Tonya Haynes is seen today for follow up of her HTN, LBBB, HLD  She was diagnosed with R breast cancer since I last saw her She has done very well since I last saw her from an exercise, diet standpoint Wt today is 182 lbs. ( Down 30 lbs from previous visit )   Walking  - does not particulally enjoy it   Her home blood pressure readings look great.   March 31, 2022 Tonya Haynes is seen today for follow up visit Recent lipids look great She has been walking regularly  Wt. Is 178  ( down 4 lbs from last visit)  The mag trace is fading slowly.    Has some tenderness along the R side of her R breast .  Not to the point that it is worrisome.     Is enjoying walking now. Looks forward to walking now .   She had some questions about  letrozole ( Femara)  Apparently it can cause some cholesterol elevations in post menapausal women.  We discussed the fact that she is getting significant benefit from the Femara and that any slight increased of cholesterol at this time would likely not be significant    Dec. 18, 2023  Tonya Haynes is seen today for follow up of her HTN, LBBB , HLD  Hx of R breast cancer , s/p lumpectomy and XRT .   Home BP readings look great ,  120s / 68  Going to the gym regularly .  Is working out 2-3 a week  Exercises    March 15, 2023 Tonya Haynes is seen for follow up of her HTN, LBBB, HLD  Hx of R breast cancer , s/p lumpectomy, and XRT  Echo in Dec. 29, 2023 showed normal LV systolic function with LVEF of 60-65% Grade I DD Borderline dilatation of aorta ( unchanged from previous studies )   She and Eddie are leaving for a walking tour in [employer] well , no CP , no dyspne BP has been good    Recent labs: Chol = 147 HDL = 60 Trigs = 123 LDL  = 65    Current Outpatient Medications on File Prior to Visit  Medication Sig Dispense Refill   acetaminophen (TYLENOL) 500 MG tablet Take 1,000 mg by mouth every 6 (six) hours as needed for moderate pain.     fexofenadine (ALLEGRA) 180 MG tablet Take 180 mg by mouth daily as needed for allergies or rhinitis.     hydrochlorothiazide (MICROZIDE) 12.5 MG capsule Take 1 capsule (12.5 mg total) by mouth daily. 90 capsule 0   letrozole (FEMARA) 2.5 MG tablet TAKE 1 TABLET(2.5 MG) BY MOUTH DAILY 90 tablet 3   lisinopril (ZESTRIL) 10 MG tablet TAKE 1 TABLET(10 MG) BY MOUTH DAILY 90 tablet 3   Multiple Minerals-Vitamins (CITRACAL MAXIMUM PLUS) TABS 650mg  Calcium, 1000 IU Vitamin D daily     Multiple Vitamin (MULTI-VITAMIN PO) Take 1 capsule by mouth daily.      nebivolol (BYSTOLIC) 10 MG tablet TAKE 1 ZOXWRU(04 MG) BY MOUTH DAILY 90 tablet 2   potassium chloride (KLOR-CON) 10 MEQ tablet TAKE 1 TABLET(10 MEQ) BY MOUTH DAILY 90 tablet 3   rosuvastatin (CRESTOR) 5 MG tablet Take 1 tablet (5 mg total) by mouth daily. 90 tablet 3   Triamcinolone Acetonide (NASACORT ALLERGY 24HR NA) Place 1 spray into the nose daily as needed.     No current facility-administered medications on file prior to visit.    Allergies  Allergen Reactions   Chlorhexidine Gluconate Rash    Rash with CHG soap and wipes    Past Medical History:  Diagnosis Date   Anxiety    Asthma    Breast cancer ([facility])    Diastolic dysfunction    History of basal cell carcinoma    Hypertension    LBBB (left bundle branch block)    Unspecified asthma(493.90)     Problem list entry automatically replaced. Please review for accuracy.    Past Surgical History:  Procedure Laterality Date   AXILLARY SENTINEL NODE BIOPSY Right 08/19/2021   Procedure: AXILLARY SENTINEL NODE BIOPSY;  Surgeon: Manus Rudd, MD;  [facility];  Service: General;  Laterality: Right;   BASAL CELL CARCINOMA EXCISION     RIGHT DELTOID MUSCLE   BREAST  BIOPSY Right 08/11/2021   BREAST LUMPECTOMY WITH RADIOACTIVE SEED AND SENTINEL LYMPH NODE BIOPSY Right 08/19/2021   Procedure: RIGHT BREAST  LUMPECTOMY WITH RADIOACTIVE SEED X2;  Surgeon: Manus Rudd, MD;  Location: [facility];  Service: General;  Laterality: Right;  120 MINUTES ROOM 1   RADIOACTIVE SEED GUIDED AXILLARY SENTINEL LYMPH NODE Right 08/19/2021   Procedure: RADIOACTIVE SEED GUIDED RIGHT  AXILLARY SENTINEL LYMPH NODE DISSECTION;  Surgeon: Manus Rudd, MD;  [facility];  Service: General;  Laterality: Right;   RE-EXCISION OF BREAST LUMPECTOMY Right 09/08/2021   Procedure: RE-EXCISION OF INFERIOR MARGIN RIGHT BREAST LUMPECTOMY;  Surgeon: Manus Rudd, MD;  Location: [facility];  Service: General;  Laterality: Right;    Social History   Tobacco Use  Smoking Status Never  Smokeless Tobacco Never    Social History   Substance and Sexual Activity  Alcohol Use Yes   Comment: Social    Family History  Problem Relation Age of Onset   Breast cancer Mother 2   Fibromyalgia Mother    Hypertension Father    Lung cancer Maternal Uncle        smoked   Stomach cancer Paternal Uncle        dx. 40s    Reviw of Systems:  Reviewed in the HPI.  All other systems are negative.  Physical Exam:        EKG:       Assessment / Plan:   1. Hypertension: Blood pressure is well-controlled.  Continue current medications.    2. Left bundle branch block:  -Stable.  Will plan on getting an EKG in 6 months when I see her again.     3.  Hyperlipidemia: Lipid levels look great.    4.  Breast cancer:  .  Stable.  She still has some staining from her mag trace injection.  This perhaps is faded slightly but not significantly.    I will see her again in 6 months.     Kristeen Miss, MD  03/13/2023 7:54 PM    [facility] [address] [place],  [address] [place], [place]  [address] Pager [phone] Phone: [phone]; Fax: [phone]

## 2023-03-14 LAB — BASIC METABOLIC PANEL
BUN/Creatinine Ratio: 20 (ref 9–23)
BUN: 19 mg/dL (ref 6–24)
CO2: 28 mmol/L (ref 20–29)
Calcium: 9.7 mg/dL (ref 8.7–10.2)
Chloride: 101 mmol/L (ref 96–106)
Creatinine, Ser: 0.93 mg/dL (ref 0.57–1.00)
Glucose: 107 mg/dL — ABNORMAL HIGH (ref 70–99)
Potassium: 4.4 mmol/L (ref 3.5–5.2)
Sodium: 139 mmol/L (ref 134–144)
eGFR: 71 mL/min/{1.73_m2} (ref >59)

## 2023-03-14 LAB — LIPID PANEL
Chol/HDL Ratio: 2.5 ratio (ref 0.0–4.4)
Cholesterol, Total: 147 mg/dL (ref 100–199)
HDL: 60 mg/dL (ref >39)
LDL Chol Calc (NIH): 65 mg/dL (ref 0–99)
Triglycerides: 123 mg/dL (ref 0–149)
VLDL Cholesterol Cal: 22 mg/dL (ref 5–40)

## 2023-03-14 LAB — ALT: ALT: 28 IU/L (ref 0–32)

## 2023-03-15 ENCOUNTER — Ambulatory Visit: Payer: BC Managed Care – PPO | Attending: Cardiovascular Disease | Admitting: Cardiovascular Disease

## 2023-03-15 ENCOUNTER — Encounter: Payer: Self-pay | Admitting: Cardiovascular Disease

## 2023-03-15 VITALS — BP 116/84 | HR 94 | Ht 68.0 in | Wt 179.8 lb

## 2023-03-15 DIAGNOSIS — I447 Left bundle-branch block, unspecified: Secondary | ICD-10-CM

## 2023-03-15 DIAGNOSIS — I10 Essential (primary) hypertension: Secondary | ICD-10-CM | POA: Diagnosis not present

## 2023-03-15 NOTE — Patient Instructions (Signed)
Medication Instructions:  Your physician recommends that you continue on your current medications as directed. Please refer to the Current Medication list given to you today.  *If you need a refill on your cardiac medications before your next appointment, please call your pharmacy*   Lab Work: None ordered  If you have labs (blood work) drawn today and your tests are completely normal, you will receive your results only by: MyChart Message (if you have MyChart) OR A paper copy in the mail If you have any lab test that is abnormal or we need to change your treatment, we will call you to review the results.   Testing/Procedures: None    Follow-Up: At [facility], you and your health needs are our priority.  As part of our continuing mission to provide you with exceptional heart care, we have created designated Provider Care Teams.  These Care Teams include your primary Cardiologist (physician) and Advanced Practice Providers (APPs -  Physician Assistants and Nurse Practitioners) who all work together to provide you with the care you need, when you need it.  We recommend signing up for the patient portal called "MyChart".  Sign up information is provided on this After Visit Summary.  MyChart is used to connect with patients for Virtual Visits (Telemedicine).  Patients are able to view lab/test results, encounter notes, upcoming appointments, etc.  Non-urgent messages can be sent to your provider as well.   To learn more about what you can do with MyChart, go to [email or web address].    Your next appointment:   6 month(s)  Provider:   Kristeen Miss, MD

## 2023-03-21 ENCOUNTER — Ambulatory Visit: Payer: BC Managed Care – PPO | Admitting: Cardiovascular Disease

## 2023-05-26 ENCOUNTER — Other Ambulatory Visit: Payer: Self-pay | Admitting: Cardiovascular Disease

## 2023-05-26 ENCOUNTER — Other Ambulatory Visit: Payer: Self-pay | Admitting: Hematology and Oncology

## 2023-06-29 ENCOUNTER — Encounter: Payer: Self-pay | Admitting: Physical Therapy

## 2023-08-21 ENCOUNTER — Inpatient Hospital Stay: Payer: BC Managed Care – PPO | Attending: Hematology and Oncology | Admitting: Hematology and Oncology

## 2023-08-21 ENCOUNTER — Other Ambulatory Visit: Payer: Self-pay

## 2023-08-21 VITALS — BP 162/103 | HR 90 | Temp 98.1°F | Resp 18 | Ht 68.0 in | Wt 193.5 lb

## 2023-08-21 DIAGNOSIS — M8580 Other specified disorders of bone density and structure, unspecified site: Secondary | ICD-10-CM | POA: Diagnosis not present

## 2023-08-21 DIAGNOSIS — Z780 Asymptomatic menopausal state: Secondary | ICD-10-CM | POA: Diagnosis not present

## 2023-08-21 DIAGNOSIS — C773 Secondary and unspecified malignant neoplasm of axilla and upper limb lymph nodes: Secondary | ICD-10-CM | POA: Insufficient documentation

## 2023-08-21 DIAGNOSIS — Z79811 Long term (current) use of aromatase inhibitors: Secondary | ICD-10-CM | POA: Insufficient documentation

## 2023-08-21 DIAGNOSIS — Z170 Estrogen receptor positive status [ER+]: Secondary | ICD-10-CM | POA: Insufficient documentation

## 2023-08-21 DIAGNOSIS — C50411 Malignant neoplasm of upper-outer quadrant of right female breast: Secondary | ICD-10-CM | POA: Diagnosis present

## 2023-08-21 DIAGNOSIS — Z79899 Other long term (current) drug therapy: Secondary | ICD-10-CM | POA: Insufficient documentation

## 2023-08-21 DIAGNOSIS — Z1721 Progesterone receptor positive status: Secondary | ICD-10-CM | POA: Diagnosis not present

## 2023-08-21 NOTE — Progress Notes (Signed)
Patient Care Team: Laurann Montana, MD as PCP - General (Family Medicine) Nahser, Deloris Ping, MD as PCP - Cardiology (Cardiology) Manus Rudd, MD as Consulting Physician (General Surgery) Serena Croissant, MD as Consulting Physician (Hematology and Oncology) Lonie Peak, MD as Attending Physician (Radiation Oncology)  DIAGNOSIS:  Encounter Diagnoses  Name Primary?   Malignant neoplasm of upper-outer quadrant of right breast in female, estrogen receptor positive (HCC) Yes   Post-menopausal     SUMMARY OF ONCOLOGIC HISTORY: Oncology History  Malignant neoplasm of upper-outer quadrant of right breast in female, estrogen receptor positive (HCC)  07/13/2021 Initial Diagnosis   Screening mammogram: possible 0.6 cm distortion in the right breast. Diagnostic mammogram and [place]: 0.7 cm distortion in the right breast. Biopsy: grade 1/2 IDC with axillary lymph node (+) involved by metastatic carcinoma, ER+(95%) PR+(90%) Her2- Ki-67 1%   07/28/2021 Cancer Staging   Staging form: Breast, AJCC 8th Edition - Clinical stage from 07/28/2021: Stage IIA (cT2, cN1(f), cM0, G2, ER+, PR+, HER2-) - Signed by Serena Croissant, MD on 08/16/2021 Stage prefix: Initial diagnosis Method of lymph node assessment: Fine needle aspiration Histologic grading system: 3 grade system    Genetic Testing   [facility] CancerNext (36 genes) was Negative. Report date is 08/05/2021.  The CancerNext gene panel offered by [employer] includes sequencing, rearrangement analysis, and RNA analysis for the following 36 genes:   APC, ATM, AXIN2, BARD1, BMPR1A, BRCA1, BRCA2, BRIP1, CDH1, CDK4, CDKN2A, CHEK2, DICER1, HOXB13, EPCAM, GREM1, MLH1, MSH2, MSH3, MSH6, MUTYH, NBN, NF1, NTHL1, PALB2, PMS2, POLD1, POLE, PTEN, RAD51C, RAD51D, RECQL, SMAD4, SMARCA4, STK11, and TP53.    07/2021 -  Anti-estrogen oral therapy   Adjuvant antiestrogen therapy with letrozole started prior to lumpectomy   08/19/2021 Surgery   Right lumpectomy: 5 cm (2  foci contiguous) grade 2 IDC and ILC with DCIS, involves the inferior margin, ALH, 1/3 lymph nodes positive ER 70 to 95%, PR 50 to 90%, HER2 1+ by IHC, Ki-67 1%   08/30/2021 Cancer Staging   Staging form: Breast, AJCC 8th Edition - Pathologic: Stage IB (pT3, pN1, cM0, G2, ER+, PR+, HER2-) - Signed by Serena Croissant, MD on 08/30/2021 Stage prefix: Initial diagnosis Multigene prognostic tests performed: MammaPrint Histologic grading system: 3 grade system   10/07/2021 - 11/16/2021 Radiation Therapy   Site Technique Total Dose (Gy) Dose per Fx (Gy) Completed Fx Beam Energies  Breast, Right: Breast_R 3D 50/50 2 25/25 10X  Breast, Right: Breast_R_PAB_SCV 3D 50/50 2 25/25 6X, 15X  Breast, Right: Breast_R_Bst 3D 10/10 2 5/5 6X       CHIEF COMPLIANT: Follow-up on letrozole therapy  HISTORY OF PRESENT ILLNESS:   History of Present Illness   The patient, a breast cancer survivor, is two years post-surgery and has been on letrozole for the same duration. She reports no noticeable side effects from the medication, except for thinning hair. She denies experiencing hot flashes or joint stiffness. The patient's bone density is mildly osteopenic at -1.5, but she is not on any prescription treatment for this. She is taking vitamin D and calcium supplements, and she engages in weight-bearing exercises. The patient also had an MRI in March of the previous year, which showed no concerning findings. She expresses a desire to continue with annual MRIs due to the reassurance they provide.         ALLERGIES:  is allergic to chlorhexidine gluconate.  MEDICATIONS:  Current Outpatient Medications  Medication Sig Dispense Refill   acetaminophen (TYLENOL) 500 MG tablet Take  1,000 mg by mouth every 6 (six) hours as needed for moderate pain.     fexofenadine (ALLEGRA) 180 MG tablet Take 180 mg by mouth daily as needed for allergies or rhinitis.     hydrochlorothiazide (MICROZIDE) 12.5 MG capsule TAKE 1 CAPSULE(12.5  MG) BY MOUTH DAILY 90 capsule 3   letrozole (FEMARA) 2.5 MG tablet TAKE 1 TABLET(2.5 MG) BY MOUTH DAILY 90 tablet 3   lisinopril (ZESTRIL) 10 MG tablet TAKE 1 TABLET(10 MG) BY MOUTH DAILY 90 tablet 3   Multiple Minerals-Vitamins (CITRACAL MAXIMUM PLUS) TABS 650mg  Calcium, 1000 IU Vitamin D daily     Multiple Vitamin (MULTI-VITAMIN PO) Take 1 capsule by mouth daily.      nebivolol (BYSTOLIC) 10 MG tablet TAKE 1 WUJWJX(91 MG) BY MOUTH DAILY 90 tablet 2   potassium chloride (KLOR-CON) 10 MEQ tablet TAKE 1 TABLET(10 MEQ) BY MOUTH DAILY 90 tablet 3   rosuvastatin (CRESTOR) 5 MG tablet TAKE 1 TABLET(5 MG) BY MOUTH DAILY 90 tablet 3   Triamcinolone Acetonide (NASACORT ALLERGY 24HR NA) Place 1 spray into the nose daily as needed.     No current facility-administered medications for this visit.    PHYSICAL EXAMINATION: ECOG PERFORMANCE STATUS: 1 - Symptomatic but completely ambulatory  Vitals:   08/21/23 0823  BP: (!) 162/103  Pulse: 90  Resp: 18  Temp: 98.1 F (36.7 C)  SpO2: 98%   Filed Weights   08/21/23 0823  Weight: 193 lb 8 oz (87.8 kg)    Physical Exam no palpable lumps or nodules of concern        (exam performed in the presence of a chaperone)  LABORATORY DATA:  I have reviewed the data as listed    Latest Ref Rng & Units 03/13/2023   10:20 AM 03/28/2022    9:55 AM 01/03/2022    9:22 AM  CMP  Glucose 70 - 99 mg/dL 478     BUN 6 - 24 mg/dL 19     Creatinine 2.95 - 1.00 mg/dL 6.21     Sodium 308 - 657 mmol/L 139     Potassium 3.5 - 5.2 mmol/L 4.4     Chloride 96 - 106 mmol/L 101     CO2 20 - 29 mmol/L 28     Calcium 8.7 - 10.2 mg/dL 9.7     ALT 0 - 32 IU/L 28  28  24      Lab Results  Component Value Date   WBC 6.1 07/28/2021   HGB 13.7 07/28/2021   HCT 39.7 07/28/2021   MCV 86.7 07/28/2021   PLT 133 (L) 07/28/2021   NEUTROABS 4.2 07/28/2021    ASSESSMENT & PLAN:  Malignant neoplasm of upper-outer quadrant of right breast in female, estrogen receptor positive  (HCC) 07/13/2021: Screening mammogram: possible 0.6 cm distortion in the right breast. Diagnostic mammogram and [place]: 0.7 cm distortion in the right breast. 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 08/19/2021:Right lumpectomy: 5 cm (2 foci contiguous) grade 2 IDC and ILC with DCIS, involves the inferior margin, ALH, 1/3 lymph nodes positive ER 70 to 95%, PR 50 to 90%, HER2 1+ by IHC, Ki-67 1% MammaPrint on initial biopsy: Low risk MammaPrint on the final lymph node biopsy: High risk (but the MammaPrint index was extremely low and therefore the probability of benefit from chemotherapy was extremely small)   Treatment plan: Adjuvant  radiation therapy completed 11/16/21 Followed by adjuvant antiestrogen therapy (she started letrozole prior to surgery) October 2022   Letrozole toxicities: Denies any major adverse effects to letrozole.   Breast Cancer Surveillance: 1. Breast Exam 08/21/2023: Benign 2. Mammograms: 06/20/22: [facility] Benign density Cat A 3.  Breast MRI 12/21/2022: Benign breast density category B   Patient prefers MRIs annually. (Because mammograms grossly underestimated the size of the tumor)   Bone density 06/20/22: Osteopenia T score -1.5 Next bone density will be done in September 2025   RTC in 1 year     Orders Placed This Encounter  Procedures   MR BREAST BILATERAL W WO CONTRAST INC CAD    Standing Status:   Future    Standing Expiration Date:   08/20/2024    Order Specific Question:   If indicated for the ordered procedure, I authorize the administration of contrast media per Radiology protocol    Answer:   Yes    Order Specific Question:   What is the patient's sedation requirement?    Answer:   No Sedation    Order Specific Question:   Does the patient have a pacemaker or implanted devices?    Answer:   No     Order Specific Question:   Preferred imaging location?    Answer:   [facility] (table limit-550lbs)    Order Specific Question:   Release to patient    Answer:   Immediate   DG Bone Density    Standing Status:   Future    Standing Expiration Date:   08/20/2024    Scheduling Instructions:     Please do same time as her mammograms    Order Specific Question:   Reason for Exam (SYMPTOM  OR DIAGNOSIS REQUIRED)    Answer:   osteopenia    Comments:   [facility]    Order Specific Question:   Is patient pregnant?    Answer:   No    Order Specific Question:   Preferred imaging location?    Answer:   [facility]    Order Specific Question:   Release to patient    Answer:   Immediate   The patient has a good understanding of the overall plan. she agrees with it. she will call with any problems that may develop before the next visit here. Total time spent: 30 mins including face to face time and time spent for planning, charting and co-ordination of care   Tamsen Meek, MD 08/21/23

## 2023-08-21 NOTE — Assessment & Plan Note (Addendum)
07/13/2021: Screening mammogram: possible 0.6 cm distortion in the right breast. Diagnostic mammogram and [place]: 0.7 cm distortion in the right breast. 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   Breast Cancer Surveillance: 1. Breast Exam 08/21/2023: Benign 2. Mammograms: 06/20/22: [facility] Benign density Cat A 3.  Breast MRI 12/21/2022: Benign breast density category B   Patient prefers MRIs annually. (Because mammograms grossly underestimated the size of the tumor)   Bone density 06/20/22: Osteopenia T score -1.5 Next bone density will be done in September 2025   RTC in 1 year

## 2023-08-23 ENCOUNTER — Encounter: Payer: Self-pay | Admitting: Hematology and Oncology

## 2023-08-24 ENCOUNTER — Encounter: Payer: Self-pay | Admitting: Hematology and Oncology

## 2023-08-25 ENCOUNTER — Telehealth: Payer: Self-pay

## 2023-08-25 ENCOUNTER — Encounter: Payer: Self-pay | Admitting: Hematology and Oncology

## 2023-08-25 NOTE — Telephone Encounter (Signed)
Called pt per MD to advise [facility] Reveal testing was negative/not detected. Pt verbalized understanding of results and knows [facility] will be in touch in 6 mo repeat lab

## 2023-08-28 ENCOUNTER — Ambulatory Visit: Payer: BC Managed Care – PPO | Attending: Surgery

## 2023-08-28 VITALS — Wt 194.1 lb

## 2023-08-28 DIAGNOSIS — Z483 Aftercare following surgery for neoplasm: Secondary | ICD-10-CM | POA: Insufficient documentation

## 2023-08-28 NOTE — Therapy (Addendum)
 " OUTPATIENT PHYSICAL THERAPY SOZO SCREENING NOTE   Patient Name: Tonya Haynes MRN: [id number] DOB:06-11-1961, 62 y.o., female Today's Date: 08/28/2023  PCP: Teresa Channel, MD REFERRING PROVIDER: Belinda Cough, MD   PT End of Session - 08/28/23 1519     Visit Number 15   # unchanged due to screen only   PT Start Time 1517    PT Stop Time 1521    PT Time Calculation (min) 4 min    Activity Tolerance Patient tolerated treatment well    Behavior During Therapy WFL for tasks assessed/performed             Past Medical History:  Diagnosis Date   Anxiety    Asthma    Breast cancer (HCC)    Diastolic dysfunction    History of basal cell carcinoma    Hypertension    LBBB (left bundle branch block)    Unspecified asthma(493.90)     Problem list entry automatically replaced. Please review for accuracy.   Past Surgical History:  Procedure Laterality Date   AXILLARY SENTINEL NODE BIOPSY Right 08/19/2021   Procedure: AXILLARY SENTINEL NODE BIOPSY;  Surgeon: Belinda Cough, MD;  Location: [facility];  Service: General;  Laterality: Right;   BASAL CELL CARCINOMA EXCISION     RIGHT DELTOID MUSCLE   BREAST BIOPSY Right 08/11/2021   BREAST LUMPECTOMY WITH RADIOACTIVE SEED AND SENTINEL LYMPH NODE BIOPSY Right 08/19/2021   Procedure: RIGHT BREAST LUMPECTOMY WITH RADIOACTIVE SEED X2;  Surgeon: Belinda Cough, MD;  Location: [facility];  Service: General;  Laterality: Right;  120 MINUTES ROOM 1   RADIOACTIVE SEED GUIDED AXILLARY SENTINEL LYMPH NODE Right 08/19/2021   Procedure: RADIOACTIVE SEED GUIDED RIGHT  AXILLARY SENTINEL LYMPH NODE DISSECTION;  Surgeon: Belinda Cough, MD;  Location: [facility];  Service: General;  Laterality: Right;   RE-EXCISION OF BREAST LUMPECTOMY Right 09/08/2021   Procedure: RE-EXCISION OF INFERIOR MARGIN RIGHT BREAST LUMPECTOMY;  Surgeon: Belinda Cough, MD;  Location: [facility];  Service: General;  Laterality: Right;   Patient Active Problem List    Diagnosis Date Noted   Genetic testing 08/06/2021   Family history of breast cancer 07/29/2021   Asthma 07/27/2021   Malignant neoplasm of upper-outer quadrant of right breast in female, estrogen receptor positive (HCC) 07/23/2021   Essential hypertension, malignant 12/12/2016   Hypertension    LBBB (left bundle branch block)    Anxiety    Asthma    Diastolic dysfunction    History of basal cell carcinoma     REFERRING DIAG: Rt breast cancer at risk for lymphedema  THERAPY DIAG: Aftercare following surgery for neoplasm  PERTINENT HISTORY: Patient was diagnosed on 06/18/2021 with right grade I invasive ductal carcinoma breast cancer. It measures 7 mm and is located in the upper outer quadrant. It is ER/PR positive and HER2 negative with a Ki67 of 1%. She has a biopsied positive axillary lymph node. Rt lumpectomy 08/19/21 with 1/3 LN positive.  Radiation completed 11/16/21. Antiestrogen therapy.   PRECAUTIONS: right UE Lymphedema risk, None  SUBJECTIVE: Pt returns for her 3 month L-dex screen.   PAIN:  Are you having pain? No.  SOZO SCREENING: Patient was assessed today using the SOZO machine to determine the lymphedema index score. This was compared to her baseline score. It was determined that she is within the recommended range when compared to her baseline and no further action is needed at this time. She will continue SOZO screenings. These are done every 3 months  for 2 years post operatively followed by every 6 months for 2 years, and then annually.  PT assessed pt after screen today.    L-DEX FLOWSHEETS - 08/28/23 1500       L-DEX LYMPHEDEMA SCREENING   Measurement Type Unilateral    L-DEX MEASUREMENT EXTREMITY Upper Extremity    POSITION  Standing    DOMINANT SIDE Right    At Risk Side Right    BASELINE SCORE (UNILATERAL) -2.6    L-DEX SCORE (UNILATERAL) -7.4    VALUE CHANGE (UNILAT) -4.8               Tonya Haynes, PTA 08/28/2023, 3:20 PM     PHYSICAL THERAPY DISCHARGE SUMMARY  Visits from Start of Care: 15 + SOZO screens   Current functional level related to goals / functional outcomes: Formal PT visits completed    Remaining deficits: Lymphedema risk    Education / Equipment: Final HEP  Plan: Patient agrees to discharge.   Patient is being discharged due to meeting the stated rehab goals.    Tonya Haynes, PT 11/04/24, 9:06 AM      "

## 2023-09-09 ENCOUNTER — Encounter: Payer: Self-pay | Admitting: Cardiovascular Disease

## 2023-09-09 NOTE — Progress Notes (Unsigned)
Tonya Haynes Date of Birth  1961/02/01 [facility] / [facility] [address]     [address] [place], [place]  [address] [phone]  Fax  [phone]    Tonya Haynes is a middle-aged female who I have been seeing for hypertension. She's done very well. Her last echocardiogram shows normal left ventricular systolic function. She did have moderate left ventricular hypertrophy but this has improved slightly and now she only has mild left ventricular hypertrophy.  She is able to do all of her normal activities without any significant problems.  Sept. 2, 2016:  Doing well. No cp, no dyspnea. No complaints.  Not much exercise.  Planning on starting soon. Has been check ing her BP  - readings are all normal  125 / 72   She is very anxious today.  December 12, 2016:  Tonya Haynes is seen today for follow up visit  BP at home is good.    Last echo in 2016 shows normal LV function .  Is planning on walking some   Walking regularly ,   Lipids look great   Feb 12, 2018:  Tonya Haynes is seen today for follow up of her HTN,LBBB and CHF Doing well .    BP has been good .   No cp , breathing is good .   Sept. 28, 2020  Tonya Haynes is seen for follow up of her HTN , LBBB  Grade 1 diastolic CHF Works is going ok  Does not exercise much  Does not like to sweat.   Dec. 17, 2020  Tonya Haynes is seen today for follow up visit. Is taking Bystolic 10 mg a day , Has not started the HCTZ and KCl . Is walking some - walks 45 min. 3 days a week.    No CP or dyspnea.   We will start the hydrochlorothiazide and potassium today.  She will continue to monitor blood pressure readings.  We will check a basic metabolic profile in 3 weeks and I will see her again in 3 months.  December 23, 2019:  Tonya Haynes is seen today for follow-up visit for hypertension, left bundle branch block.  We added HCTZ and potassium during her last office visit. Is walking . Feels great.   No CP    Oct. 3,  2022 Tonya Haynes is seen today for follow up of her HTN and LBBB Her last echo was ih Oct. 2016 -  normal LV systolic function - EF 50-55%, grade 1 DD Mild MR  Is walking some  We discussed regular exercise  January 05, 2022 Tonya Haynes is seen today for follow up of her HTN, LBBB, HLD  She was diagnosed with R breast cancer since I last saw her She has done very well since I last saw her from an exercise, diet standpoint Wt today is 182 lbs. ( Down 30 lbs from previous visit )   Walking  - does not particulally enjoy it   Her home blood pressure readings look great.   March 31, 2022 Tonya Haynes is seen today for follow up visit Recent lipids look great She has been walking regularly  Wt. Is 178  ( down 4 lbs from last visit)  The mag trace is fading slowly.    Has some tenderness along the R side of her R breast .  Not to the point that it is worrisome.     Is enjoying walking now. Looks forward to walking now .   She had some questions about  letrozole ( Femara)  Apparently it can cause some cholesterol elevations in post menapausal women.  We discussed the fact that she is getting significant benefit from the Femara and that any slight increased of cholesterol at this time would likely not be significant    Dec. 18, 2023  Tonya Haynes is seen today for follow up of her HTN, LBBB , HLD  Hx of R breast cancer , s/p lumpectomy and XRT .   Home BP readings look great ,  120s / 68  Going to the gym regularly .  Is working out 2-3 a week  Exercises    March 15, 2023 Tonya Haynes is seen for follow up of her HTN, LBBB, HLD  Hx of R breast cancer , s/p lumpectomy, and XRT  Echo in Dec. 29, 2023 showed normal LV systolic function with LVEF of 60-65% Grade I DD Borderline dilatation of aorta ( unchanged from previous studies )   She and Eddie are leaving for a walking tour in [employer] well , no CP , no dyspne BP has been good    Recent labs: Chol = 147 HDL = 60 Trigs = 123 LDL  = 65   Dec. 9, 2024  Tonya Haynes is seen for follow up of her HTN, LBBB, HLD  Echo from Dec. 2023 shows normal LV systolic function .  Grade I DD  Borderline dilatation of her aorta   No dyspnea .      Current Outpatient Medications on File Prior to Visit  Medication Sig Dispense Refill   acetaminophen (TYLENOL) 500 MG tablet Take 1,000 mg by mouth every 6 (six) hours as needed for moderate pain.     fexofenadine (ALLEGRA) 180 MG tablet Take 180 mg by mouth daily as needed for allergies or rhinitis.     hydrochlorothiazide (MICROZIDE) 12.5 MG capsule TAKE 1 CAPSULE(12.5 MG) BY MOUTH DAILY 90 capsule 3   letrozole (FEMARA) 2.5 MG tablet TAKE 1 TABLET(2.5 MG) BY MOUTH DAILY 90 tablet 3   lisinopril (ZESTRIL) 10 MG tablet TAKE 1 TABLET(10 MG) BY MOUTH DAILY 90 tablet 3   Multiple Minerals-Vitamins (CITRACAL MAXIMUM PLUS) TABS 650mg  Calcium, 1000 IU Vitamin D daily     Multiple Vitamin (MULTI-VITAMIN PO) Take 1 capsule by mouth daily.      nebivolol (BYSTOLIC) 10 MG tablet TAKE 1 ZOXWRU(04 MG) BY MOUTH DAILY 90 tablet 2   potassium chloride (KLOR-CON) 10 MEQ tablet TAKE 1 TABLET(10 MEQ) BY MOUTH DAILY 90 tablet 3   rosuvastatin (CRESTOR) 5 MG tablet TAKE 1 TABLET(5 MG) BY MOUTH DAILY 90 tablet 3   Triamcinolone Acetonide (NASACORT ALLERGY 24HR NA) Place 1 spray into the nose daily as needed.     No current facility-administered medications on file prior to visit.    Allergies  Allergen Reactions   Chlorhexidine Gluconate Rash    Rash with CHG soap and wipes    Past Medical History:  Diagnosis Date   Anxiety    Asthma    Breast cancer (HCC)    Diastolic dysfunction    History of basal cell carcinoma    Hypertension    LBBB (left bundle branch block)    Unspecified asthma(493.90)     Problem list entry automatically replaced. Please review for accuracy.    Past Surgical History:  Procedure Laterality Date   AXILLARY SENTINEL NODE BIOPSY Right 08/19/2021   Procedure: AXILLARY  SENTINEL NODE BIOPSY;  Surgeon: Manus Rudd, MD;  Location: [facility];  Service: General;  Laterality: Right;   BASAL CELL CARCINOMA EXCISION     RIGHT DELTOID MUSCLE   BREAST BIOPSY Right 08/11/2021   BREAST LUMPECTOMY WITH RADIOACTIVE SEED AND SENTINEL LYMPH NODE BIOPSY Right 08/19/2021   Procedure: RIGHT BREAST LUMPECTOMY WITH RADIOACTIVE SEED X2;  Surgeon: Manus Rudd, MD;  Location: [facility];  Service: General;  Laterality: Right;  120 MINUTES ROOM 1   RADIOACTIVE SEED GUIDED AXILLARY SENTINEL LYMPH NODE Right 08/19/2021   Procedure: RADIOACTIVE SEED GUIDED RIGHT  AXILLARY SENTINEL LYMPH NODE DISSECTION;  Surgeon: Manus Rudd, MD;  Location: [facility];  Service: General;  Laterality: Right;   RE-EXCISION OF BREAST LUMPECTOMY Right 09/08/2021   Procedure: RE-EXCISION OF INFERIOR MARGIN RIGHT BREAST LUMPECTOMY;  Surgeon: Manus Rudd, MD;  Location: [facility];  Service: General;  Laterality: Right;    Social History   Tobacco Use  Smoking Status Never  Smokeless Tobacco Never    Social History   Substance and Sexual Activity  Alcohol Use Yes   Comment: Social    Family History  Problem Relation Age of Onset   Breast cancer Mother 12   Fibromyalgia Mother    Hypertension Father    Lung cancer Maternal Uncle        smoked   Stomach cancer Paternal Uncle        dx. 40s    Reviw of Systems:  Reviewed in the HPI.  All other systems are negative.  Physical Exam:  Physical Exam: Blood pressure 112/84, pulse 87, height 5\' 8"  (1.727 m), weight 192 lb (87.1 kg), last menstrual period 04/23/2011, SpO2 98%.       GEN:  Well nourished, well developed in no acute distress HEENT: Normal NECK: No JVD; No carotid bruits LYMPHATICS: No lymphadenopathy CARDIAC: RRR , no murmurs, rubs, gallops RESPIRATORY:  Clear to auscultation without rales, wheezing or rhonchi  ABDOMEN: Soft, non-tender, non-distended MUSCULOSKELETAL:  No edema; No deformity  SKIN: Warm and  dry NEUROLOGIC:  Alert and oriented x 3     EKG:   EKG Interpretation Date/Time:  Monday September 11 2023 08:04:04 EST Ventricular Rate:  85 PR Interval:  174 QRS Duration:  162 QT Interval:  424 QTC Calculation: 504 R Axis:   -45  Text Interpretation: Normal sinus rhythm Left axis deviation Left bundle branch block Confirmed by Kristeen Miss ([id number]) on 09/11/2023 8:17:18 AM       Assessment / Plan:   1. Hypertension:   BP looks great     2. Left bundle branch block:   stable       3.  Hyperlipidemia:    Lipids look great   4.  Breast cancer:  .  Stable . Her recent scans look good Her Mag trace staining continues to fade         Kristeen Miss, MD  09/11/2023 8:17 AM    [facility] [address] [place],  [address] [place], [place]  [address] Pager [phone] Phone: [phone]; Fax: [phone]

## 2023-09-11 ENCOUNTER — Other Ambulatory Visit: Payer: Self-pay | Admitting: *Deleted

## 2023-09-11 ENCOUNTER — Ambulatory Visit: Payer: BC Managed Care – PPO | Attending: Cardiovascular Disease | Admitting: Cardiovascular Disease

## 2023-09-11 ENCOUNTER — Encounter: Payer: Self-pay | Admitting: Cardiovascular Disease

## 2023-09-11 VITALS — BP 112/84 | HR 87 | Ht 68.0 in | Wt 192.0 lb

## 2023-09-11 DIAGNOSIS — E782 Mixed hyperlipidemia: Secondary | ICD-10-CM | POA: Diagnosis not present

## 2023-09-11 DIAGNOSIS — E785 Hyperlipidemia, unspecified: Secondary | ICD-10-CM | POA: Insufficient documentation

## 2023-09-11 DIAGNOSIS — I447 Left bundle-branch block, unspecified: Secondary | ICD-10-CM | POA: Diagnosis not present

## 2023-09-11 DIAGNOSIS — I10 Essential (primary) hypertension: Secondary | ICD-10-CM | POA: Diagnosis not present

## 2023-09-11 DIAGNOSIS — I5189 Other ill-defined heart diseases: Secondary | ICD-10-CM | POA: Diagnosis not present

## 2023-09-11 DIAGNOSIS — Z79899 Other long term (current) drug therapy: Secondary | ICD-10-CM

## 2023-09-11 NOTE — Patient Instructions (Signed)
Medication Instructions:  The current medical regimen is effective;  continue present plan and medications.  *If you need a refill on your cardiac medications before your next appointment, please call your pharmacy*   Lab Work: Please have blood work 1 week before your follow up appointment with Dr Elease Hashimoto (Lipid,ALT, BMP)  This can be drawn at your closest [place].  If you have labs (blood work) drawn today and your tests are completely normal, you will receive your results only by: MyChart Message (if you have MyChart) OR A paper copy in the mail If you have any lab test that is abnormal or we need to change your treatment, we will call you to review the results.   Follow-Up: At [facility], you and your health needs are our priority.  As part of our continuing mission to provide you with exceptional heart care, we have created designated Provider Care Teams.  These Care Teams include your primary Cardiologist (physician) and Advanced Practice Providers (APPs -  Physician Assistants and Nurse Practitioners) who all work together to provide you with the care you need, when you need it.  We recommend signing up for the patient portal called "MyChart".  Sign up information is provided on this After Visit Summary.  MyChart is used to connect with patients for Virtual Visits (Telemedicine).  Patients are able to view lab/test results, encounter notes, upcoming appointments, etc.  Non-urgent messages can be sent to your provider as well.   To learn more about what you can do with MyChart, go to [email or web address].    Your next appointment:   4 - 5 month(s)  Provider:   Kristeen Miss, MD

## 2023-09-12 ENCOUNTER — Ambulatory Visit: Payer: BC Managed Care – PPO | Admitting: Cardiovascular Disease

## 2023-09-20 ENCOUNTER — Telehealth: Payer: Self-pay

## 2023-09-20 NOTE — Telephone Encounter (Signed)
Attempted to call pt regarding Guardant reveal results lvm for pt to return call back.  Guardant  results was negative.

## 2023-09-21 ENCOUNTER — Encounter: Payer: Self-pay | Admitting: Hematology and Oncology

## 2023-11-18 ENCOUNTER — Other Ambulatory Visit: Payer: Self-pay | Admitting: Cardiovascular Disease

## 2023-11-18 DIAGNOSIS — I5189 Other ill-defined heart diseases: Secondary | ICD-10-CM

## 2023-11-18 DIAGNOSIS — I10 Essential (primary) hypertension: Secondary | ICD-10-CM

## 2023-12-21 ENCOUNTER — Ambulatory Visit (HOSPITAL_COMMUNITY)
Admission: RE | Admit: 2023-12-21 | Discharge: 2023-12-21 | Disposition: A | Discharge status: Home / Self Care | Payer: Self-pay | Source: Ambulatory Visit | Attending: Hematology and Oncology | Admitting: Hematology and Oncology

## 2023-12-21 DIAGNOSIS — Z170 Estrogen receptor positive status [ER+]: Secondary | ICD-10-CM | POA: Insufficient documentation

## 2023-12-21 DIAGNOSIS — C50411 Malignant neoplasm of upper-outer quadrant of right female breast: Secondary | ICD-10-CM | POA: Insufficient documentation

## 2023-12-21 MED ORDER — GADOBUTROL 1 MMOL/ML IV SOLN
9.0000 mL | Freq: Once | INTRAVENOUS | Status: AC | PRN
  Administered 2023-12-21: 9 mL via INTRAVENOUS

## 2023-12-22 ENCOUNTER — Ambulatory Visit (HOSPITAL_COMMUNITY): Payer: Self-pay

## 2023-12-22 ENCOUNTER — Other Ambulatory Visit: Payer: BC Managed Care – PPO

## 2023-12-22 ENCOUNTER — Telehealth: Payer: Self-pay | Admitting: *Deleted

## 2023-12-22 NOTE — Telephone Encounter (Signed)
 Per MD request RN attempt x1 to contact pt regarding normal breast MRI results.  No answer, LVM.

## 2024-02-12 ENCOUNTER — Ambulatory Visit: Payer: BC Managed Care – PPO

## 2024-02-16 ENCOUNTER — Other Ambulatory Visit: Payer: Self-pay

## 2024-02-16 DIAGNOSIS — I5189 Other ill-defined heart diseases: Secondary | ICD-10-CM

## 2024-02-16 DIAGNOSIS — I10 Essential (primary) hypertension: Secondary | ICD-10-CM

## 2024-02-16 MED ORDER — LISINOPRIL 10 MG PO TABS: 10.0000 mg | ORAL_TABLET | Freq: Every day | ORAL | 2 refills | Status: AC

## 2024-02-16 MED ORDER — POTASSIUM CHLORIDE ER 10 MEQ PO TBCR: 10.0000 meq | EXTENDED_RELEASE_TABLET | Freq: Every day | ORAL | 2 refills | Status: AC

## 2024-03-14 ENCOUNTER — Telehealth: Payer: Self-pay | Admitting: *Deleted

## 2024-03-14 NOTE — Telephone Encounter (Signed)
 Per MD request RN placed call to pt with recent Guardant Reveal results being negative.  Pt educated and verbalized understanding.

## 2024-03-15 ENCOUNTER — Encounter: Payer: Self-pay | Admitting: Hematology and Oncology

## 2024-03-21 ENCOUNTER — Encounter: Payer: Self-pay | Admitting: Cardiovascular Disease

## 2024-03-21 NOTE — Progress Notes (Unsigned)
 Tonya Haynes Date of Birth  [birth year] [facility] [address]     [address] [place], [place]  [address] [phone]  Fax  [phone]    Tonya Haynes is a middle-aged female who I have been seeing for hypertension. She's done very well. Her last echocardiogram shows normal left ventricular systolic function. She did have moderate left ventricular hypertrophy but this has improved slightly and now she only has mild left ventricular hypertrophy.  She is able to do all of her normal activities without any significant problems.  Sept. 2, 2016:  Doing well. No cp, no dyspnea. No complaints.  Not much exercise.  Planning on starting soon. Has been check ing her BP  - readings are all normal  125 / 72   She is very anxious today.  December 12, 2016:  Tonya Haynes is seen today for follow up visit  BP at home is good.    Last echo in 2016 shows normal LV function .  Is planning on walking some   Walking regularly ,   Lipids look great   Feb 12, 2018:  Tonya Haynes is seen today for follow up of her HTN,LBBB and CHF Doing well .    BP has been good .   No cp , breathing is good .   Sept. 28, 2020  Tonya Haynes is seen for follow up of her HTN , LBBB  Grade 1 diastolic CHF Works is going ok  Does not exercise much  Does not like to sweat.   Dec. 17, 2020  Tonya Haynes is seen today for follow up visit. Is taking Bystolic  10 mg a day , Has not started the HCTZ and KCl . Is walking some - walks 45 min. 3 days a week.    No CP or dyspnea.   We will start the hydrochlorothiazide  and potassium today.  She will continue to monitor blood pressure readings.  We will check a basic metabolic profile in 3 weeks and I will see her again in 3 months.  December 23, 2019:  Tonya Haynes is seen today for follow-up visit for hypertension, left bundle branch block.  We added HCTZ and potassium during her last office visit. Is walking . Feels great.   No CP    Oct. 3,  2022 Tonya Haynes is seen today for follow up of her HTN and LBBB Her last echo was ih Oct. 2016 -  normal LV systolic function - EF 50-55%, grade 1 DD Mild MR  Is walking some  We discussed regular exercise  January 05, 2022 Tonya Haynes is seen today for follow up of her HTN, LBBB, HLD  She was diagnosed with R breast cancer since I last saw her She has done very well since I last saw her from an exercise, diet standpoint Wt today is 182 lbs. ( Down 30 lbs from previous visit )   Walking  - does not particulally enjoy it   Her home blood pressure readings look great.   March 31, 2022 Tonya Haynes is seen today for follow up visit Recent lipids look great She has been walking regularly  Wt. Is 178  ( down 4 lbs from last visit)  The mag trace is fading slowly.    Has some tenderness along the R side of her R breast .  Not to the point that it is worrisome.     Is enjoying walking now. Looks forward to walking now .   She had some questions about  letrozole  ( Femara )  Apparently it can cause some cholesterol elevations in post menapausal women.  We discussed the fact that she is getting significant benefit from the Femara  and that any slight increased of cholesterol at this time would likely not be significant    Dec. 18, 2023  Tonya Haynes is seen today for follow up of her HTN, LBBB , HLD  Hx of R breast cancer , s/p lumpectomy and XRT .   Home BP readings look great ,  120s / 68  Going to the gym regularly .  Is working out 2-3 a week  Exercises    March 15, 2023 Tonya Haynes is seen for follow up of her HTN, LBBB, HLD  Hx of R breast cancer , s/p lumpectomy, and XRT  Echo in Dec. 29, 2023 showed normal LV systolic function with LVEF of 60-65% Grade I DD Borderline dilatation of aorta ( unchanged from previous studies )   She and Tonya Haynes are leaving for a walking tour in [employer] well , no CP , no dyspne BP has been good    Recent labs: Chol = 147 HDL = 60 Trigs = 123 LDL  = 65   Dec. 9, 2024  Tonya Haynes is seen for follow up of her HTN, LBBB, HLD  Echo from Dec. 2023 shows normal LV systolic function .  Grade I DD  Borderline dilatation of her aorta   No dyspnea .   March 25 2024 Tonya Haynes is seen for follow up of her LBBB, HLD, obesity  She has grade I DD on echo  She has been exercising regularly and her BP has been well controlled  Has regained some weight  Is not exercising quite as much  Has been busy at home .    We discussed coronary calcium  score  She wants to wait and discuss with her new provider     Current Outpatient Medications on File Prior to Visit  Medication Sig Dispense Refill   acetaminophen  (TYLENOL ) 500 MG tablet Take 1,000 mg by mouth every 6 (six) hours as needed for moderate pain.     fexofenadine (ALLEGRA) 180 MG tablet Take 180 mg by mouth daily as needed for allergies or rhinitis.     hydrochlorothiazide  (MICROZIDE ) 12.5 MG capsule TAKE 1 CAPSULE(12.5 MG) BY MOUTH DAILY 90 capsule 3   letrozole  (FEMARA ) 2.5 MG tablet TAKE 1 TABLET(2.5 MG) BY MOUTH DAILY 90 tablet 3   lisinopril  (ZESTRIL ) 10 MG tablet Take 1 tablet (10 mg total) by mouth daily. 90 tablet 2   Multiple Minerals-Vitamins (CITRACAL MAXIMUM PLUS) TABS 650mg  Calcium , 1000 IU Vitamin D daily     Multiple Vitamin (MULTI-VITAMIN PO) Take 1 capsule by mouth daily.      nebivolol  (BYSTOLIC ) 10 MG tablet TAKE 1 TABLET(10 MG) BY MOUTH DAILY 90 tablet 3   potassium chloride  (KLOR-CON ) 10 MEQ tablet Take 1 tablet (10 mEq total) by mouth daily. 90 tablet 2   rosuvastatin  (CRESTOR ) 5 MG tablet TAKE 1 TABLET(5 MG) BY MOUTH DAILY 90 tablet 3   Triamcinolone Acetonide (NASACORT ALLERGY 24HR NA) Place 1 spray into the nose daily as needed.     No current facility-administered medications on file prior to visit.    Allergies  Allergen Reactions   Chlorhexidine  Gluconate Rash    Rash with CHG soap and wipes    Past Medical History:  Diagnosis Date   Anxiety    Asthma     Breast cancer (HCC)  Diastolic dysfunction    History of basal cell carcinoma    Hypertension    LBBB (left bundle branch block)    Unspecified asthma(493.90)     Problem list entry automatically replaced. Please review for accuracy.    Past Surgical History:  Procedure Laterality Date   AXILLARY SENTINEL NODE BIOPSY Right 08/19/2021   Procedure: AXILLARY SENTINEL NODE BIOPSY;  Surgeon: Belinda Cough, MD;  Location: [facility] OR;  Service: General;  Laterality: Right;   BASAL CELL CARCINOMA EXCISION     RIGHT DELTOID MUSCLE   BREAST BIOPSY Right 08/11/2021   BREAST LUMPECTOMY WITH RADIOACTIVE SEED AND SENTINEL LYMPH NODE BIOPSY Right 08/19/2021   Procedure: RIGHT BREAST LUMPECTOMY WITH RADIOACTIVE SEED X2;  Surgeon: Belinda Cough, MD;  Location: [facility] OR;  Service: General;  Laterality: Right;  120 MINUTES ROOM 1   RADIOACTIVE SEED GUIDED AXILLARY SENTINEL LYMPH NODE Right 08/19/2021   Procedure: RADIOACTIVE SEED GUIDED RIGHT  AXILLARY SENTINEL LYMPH NODE DISSECTION;  Surgeon: Belinda Cough, MD;  Location: [facility] OR;  Service: General;  Laterality: Right;   RE-EXCISION OF BREAST LUMPECTOMY Right 09/08/2021   Procedure: RE-EXCISION OF INFERIOR MARGIN RIGHT BREAST LUMPECTOMY;  Surgeon: Belinda Cough, MD;  Location: [facility];  Service: General;  Laterality: Right;    Social History   Tobacco Use  Smoking Status Never  Smokeless Tobacco Never    Social History   Substance and Sexual Activity  Alcohol Use Yes   Comment: Social    Family History  Problem Relation Age of Onset   Breast cancer Mother 45   Fibromyalgia Mother    Hypertension Father    Lung cancer Maternal Uncle        smoked   Stomach cancer Paternal Uncle        dx. 40s    Reviw of Systems:  Reviewed in the HPI.  All other systems are negative.   Physical Exam: Blood pressure 125/80, pulse 91, height 5' 8 (1.727 m), weight 206 lb (93.4 kg), last menstrual period 04/23/2011, SpO2 97%.        GEN:  Well nourished, well developed in no acute distress HEENT: Normal NECK: No JVD; No carotid bruits LYMPHATICS: No lymphadenopathy CARDIAC: RRR , very faint systolic murmur .  RESPIRATORY:  Clear to auscultation without rales, wheezing or rhonchi  ABDOMEN: Soft, non-tender, non-distended MUSCULOSKELETAL:  No edema; No deformity  SKIN: Warm and dry NEUROLOGIC:  Alert and oriented x 3      EKG:           Assessment / Plan:   1. Hypertension:    BP is typically well controlled.     2. Left bundle branch block:    stable    3.  Hyperlipidemia:  lipids look great.   We discussed getting a coronary Calcium  score.  She would like to wait and establish with her new cardiologist before doing the calcium  score     4.  Breast cancer:  .    Check ups have been good  The mag trace staining continues to fade          Aleene Passe, MD  03/25/2024 8:20 AM    [facility] [address] [place],  [address] [place], [place]  [address] Pager [phone] Phone: [phone]; Fax: [phone]

## 2024-03-25 ENCOUNTER — Ambulatory Visit: Payer: Self-pay | Attending: Cardiovascular Disease | Admitting: Cardiovascular Disease

## 2024-03-25 ENCOUNTER — Encounter: Payer: Self-pay | Admitting: Cardiovascular Disease

## 2024-03-25 VITALS — BP 125/80 | HR 91 | Ht 68.0 in | Wt 206.0 lb

## 2024-03-25 DIAGNOSIS — Z79899 Other long term (current) drug therapy: Secondary | ICD-10-CM

## 2024-03-25 DIAGNOSIS — I447 Left bundle-branch block, unspecified: Secondary | ICD-10-CM

## 2024-03-25 DIAGNOSIS — E782 Mixed hyperlipidemia: Secondary | ICD-10-CM

## 2024-03-25 DIAGNOSIS — I10 Essential (primary) hypertension: Secondary | ICD-10-CM

## 2024-03-25 LAB — LIPID PANEL

## 2024-03-25 NOTE — Patient Instructions (Signed)
 Lab Work: Lipids ALT BMP  If you have labs (blood work) drawn today and your tests are completely normal, you will receive your results only by: MyChart Message (if you have MyChart) OR A paper copy in the mail If you have any lab test that is abnormal or we need to change your treatment, we will call you to review the results.   Follow-Up: At [facility], you and your health needs are our priority.  As part of our continuing mission to provide you with exceptional heart care, our providers are all part of one team.  This team includes your primary Cardiologist (physician) and Advanced Practice Providers or APPs (Physician Assistants and Nurse Practitioners) who all work together to provide you with the care you need, when you need it.  Your next appointment:   6 month(s)  Provider:   Shelda Bruckner, MD

## 2024-03-26 LAB — LIPID PANEL
Cholesterol, Total: 181 mg/dL (ref 100–199)
HDL: 60 mg/dL (ref >39)
LDL CALC COMMENT:: 3 ratio (ref 0.0–4.4)
LDL Chol Calc (NIH): 100 mg/dL — ABNORMAL HIGH (ref 0–99)
Triglycerides: 120 mg/dL (ref 0–149)
VLDL Cholesterol Cal: 21 mg/dL (ref 5–40)

## 2024-03-26 LAB — BASIC METABOLIC PANEL WITH GFR
BUN/Creatinine Ratio: 27 (ref 12–28)
BUN: 26 mg/dL (ref 8–27)
CO2: 22 mmol/L (ref 20–29)
Calcium: 9.7 mg/dL (ref 8.7–10.3)
Chloride: 101 mmol/L (ref 96–106)
Creatinine, Ser: 0.95 mg/dL (ref 0.57–1.00)
Glucose: 114 mg/dL — ABNORMAL HIGH (ref 70–99)
Potassium: 4.1 mmol/L (ref 3.5–5.2)
Sodium: 139 mmol/L (ref 134–144)
eGFR: 69 mL/min/{1.73_m2} (ref >59)

## 2024-03-26 LAB — ALT: ALT: 29 IU/L (ref 0–32)

## 2024-03-28 ENCOUNTER — Ambulatory Visit: Payer: Self-pay | Admitting: Cardiovascular Disease

## 2024-05-14 ENCOUNTER — Other Ambulatory Visit: Payer: Self-pay | Admitting: Hematology and Oncology

## 2024-05-14 ENCOUNTER — Other Ambulatory Visit: Payer: Self-pay

## 2024-05-14 MED ORDER — HYDROCHLOROTHIAZIDE 12.5 MG PO CAPS: 12.5000 mg | ORAL_CAPSULE | Freq: Every day | ORAL | 3 refills | Status: AC

## 2024-05-16 ENCOUNTER — Other Ambulatory Visit: Payer: Self-pay

## 2024-05-16 MED ORDER — ROSUVASTATIN CALCIUM 5 MG PO TABS: 5.0000 mg | ORAL_TABLET | Freq: Every day | ORAL | 3 refills | Status: AC

## 2024-07-04 ENCOUNTER — Encounter: Payer: Self-pay | Admitting: Adult Health

## 2024-08-15 ENCOUNTER — Other Ambulatory Visit: Payer: Self-pay | Admitting: Hematology and Oncology

## 2024-08-21 ENCOUNTER — Inpatient Hospital Stay: Payer: BC Managed Care – PPO | Attending: Hematology and Oncology | Admitting: Hematology and Oncology

## 2024-08-21 VITALS — BP 160/90 | HR 99 | Temp 97.4°F | Resp 18 | Ht 68.0 in | Wt 210.3 lb

## 2024-08-21 DIAGNOSIS — C773 Secondary and unspecified malignant neoplasm of axilla and upper limb lymph nodes: Secondary | ICD-10-CM | POA: Diagnosis not present

## 2024-08-21 DIAGNOSIS — Z170 Estrogen receptor positive status [ER+]: Secondary | ICD-10-CM | POA: Diagnosis not present

## 2024-08-21 DIAGNOSIS — Z79811 Long term (current) use of aromatase inhibitors: Secondary | ICD-10-CM | POA: Insufficient documentation

## 2024-08-21 DIAGNOSIS — Z1721 Progesterone receptor positive status: Secondary | ICD-10-CM | POA: Diagnosis not present

## 2024-08-21 DIAGNOSIS — Z79899 Other long term (current) drug therapy: Secondary | ICD-10-CM | POA: Insufficient documentation

## 2024-08-21 DIAGNOSIS — C50411 Malignant neoplasm of upper-outer quadrant of right female breast: Secondary | ICD-10-CM | POA: Diagnosis not present

## 2024-08-21 DIAGNOSIS — Z1732 Human epidermal growth factor receptor 2 negative status: Secondary | ICD-10-CM | POA: Insufficient documentation

## 2024-08-21 MED ORDER — LETROZOLE 2.5 MG PO TABS: 2.5000 mg | ORAL_TABLET | Freq: Every day | ORAL | 3 refills | Status: AC

## 2024-08-21 NOTE — Assessment & Plan Note (Signed)
 07/13/2021: Screening mammogram: possible 0.6 cm distortion in the right breast. Diagnostic mammogram and US : 0.7 cm distortion in the right breast. Biopsy: grade 1/2 IDC with axillary lymph node (+) involved by metastatic carcinoma, ER+(95%) PR+(90%) Her2- Ki-67 1% Mammaprint: Low Risk Started letrozole  October 2022: Tolerating it extremely well. 08/19/2021:Right lumpectomy: 5 cm (2 foci contiguous) grade 2 IDC and ILC with DCIS, involves the inferior margin, ALH, 1/3 lymph nodes positive ER 70 to 95%, PR 50 to 90%, HER2 1+ by IHC, Ki-67 1% MammaPrint on initial biopsy: Low risk MammaPrint on the final lymph node biopsy: High risk (but the MammaPrint index was extremely low and therefore the probability of benefit from chemotherapy was extremely small)   Treatment plan: Adjuvant radiation therapy completed 11/16/21 Followed by adjuvant antiestrogen therapy (she started letrozole  prior to surgery) October 2022   Letrozole  toxicities: Denies any major adverse effects to letrozole .   Breast Cancer Surveillance: 1. Breast Exam 08/21/2024: Benign 2. Mammograms: 06/26/2024: [facility] Benign density Cat A 3.  Breast MRI 12/21/2022: Benign breast density category B  Bone density 06/26/2024: T-score -1.7 (to be -1.5): Osteopenia: Recommend calcium  vitamin D and bisphosphonates   Patient prefers MRIs annually. (Because mammograms grossly underestimated the size of the tumor)    RTC in 1 year

## 2024-08-21 NOTE — Progress Notes (Signed)
 Patient Care Team: Teresa Channel, MD as PCP - General (Family Medicine) Nahser, Aleene PARAS, MD (Inactive) as PCP - Cardiology (Cardiology) Belinda Cough, MD as Consulting Physician (General Surgery) Odean Potts, MD as Consulting Physician (Hematology and Oncology) Izell Domino, MD as Attending Physician (Radiation Oncology)  DIAGNOSIS:  Encounter Diagnosis  Name Primary?   Malignant neoplasm of upper-outer quadrant of right breast in female, estrogen receptor positive (HCC) Yes    SUMMARY OF ONCOLOGIC HISTORY: Oncology History  Malignant neoplasm of upper-outer quadrant of right breast in female, estrogen receptor positive (HCC)  07/13/2021 Initial Diagnosis   Screening mammogram: possible 0.6 cm distortion in the right breast. Diagnostic mammogram and US : 0.7 cm distortion in the right breast. Biopsy: grade 1/2 IDC with axillary lymph node (+) involved by metastatic carcinoma, ER+(95%) PR+(90%) Her2- Ki-67 1%   07/28/2021 Cancer Staging   Staging form: Breast, AJCC 8th Edition - Clinical stage from 07/28/2021: Stage IIA (cT2, cN1(f), cM0, G2, ER+, PR+, HER2-) - Signed by Odean Potts, MD on 08/16/2021 Stage prefix: Initial diagnosis Method of lymph node assessment: Fine needle aspiration Histologic grading system: 3 grade system    Genetic Testing   [facility] CancerNext (36 genes) was Negative. Report date is 08/05/2021.  The CancerNext gene panel offered by [employer] includes sequencing, rearrangement analysis, and RNA analysis for the following 36 genes:   APC, ATM, AXIN2, BARD1, BMPR1A, BRCA1, BRCA2, BRIP1, CDH1, CDK4, CDKN2A, CHEK2, DICER1, HOXB13, EPCAM, GREM1, MLH1, MSH2, MSH3, MSH6, MUTYH, NBN, NF1, NTHL1, PALB2, PMS2, POLD1, POLE, PTEN, RAD51C, RAD51D, RECQL, SMAD4, SMARCA4, STK11, and TP53.    07/2021 -  Anti-estrogen oral therapy   Adjuvant antiestrogen therapy with letrozole  started prior to lumpectomy   08/19/2021 Surgery   Right lumpectomy: 5 cm (2 foci  contiguous) grade 2 IDC and ILC with DCIS, involves the inferior margin, ALH, 1/3 lymph nodes positive ER 70 to 95%, PR 50 to 90%, HER2 1+ by IHC, Ki-67 1%   08/30/2021 Cancer Staging   Staging form: Breast, AJCC 8th Edition - Pathologic: Stage IB (pT3, pN1, cM0, G2, ER+, PR+, HER2-) - Signed by Odean Potts, MD on 08/30/2021 Stage prefix: Initial diagnosis Multigene prognostic tests performed: MammaPrint Histologic grading system: 3 grade system   10/07/2021 - 11/16/2021 Radiation Therapy   Site Technique Total Dose (Gy) Dose per Fx (Gy) Completed Fx Beam Energies  Breast, Right: Breast_R 3D 50/50 2 25/25 10X  Breast, Right: Breast_R_PAB_SCV 3D 50/50 2 25/25 6X, 15X  Breast, Right: Breast_R_Bst 3D 10/10 2 5/5 6X       CHIEF COMPLIANT:   HISTORY OF PRESENT ILLNESS:   History of Present Illness Tonya Haynes is a 63 year old female with breast cancer who presents for routine follow-up and surveillance.  She is on letrozole  therapy since August 03, 2021, following surgery on August 19, 2021, and experiences hair thinning as a side effect. Her bone density has decreased slightly from a T score of -1.5 to -1.7, and she engages in weight-bearing exercises to maintain it. She has undergone the Guardant blood test twice, with results showing zero. She is experiencing issues with insurance coverage for the test, having switched from [facility] to [place], and is currently in a grievance appeal process with [employer] regarding the test's authorization.     ALLERGIES:  is allergic to chlorhexidine  gluconate.  MEDICATIONS:  Current Outpatient Medications  Medication Sig Dispense Refill   acetaminophen  (TYLENOL ) 500 MG tablet Take 1,000 mg by mouth every 6 (six)  hours as needed for moderate pain.     fexofenadine (ALLEGRA) 180 MG tablet Take 180 mg by mouth daily as needed for allergies or rhinitis.     hydrochlorothiazide  (MICROZIDE ) 12.5 MG capsule Take 1 capsule (12.5 mg  total) by mouth daily. 90 capsule 3   letrozole  (FEMARA ) 2.5 MG tablet TAKE 1 TABLET(2.5 MG) BY MOUTH DAILY 90 tablet 0   lisinopril  (ZESTRIL ) 10 MG tablet Take 1 tablet (10 mg total) by mouth daily. 90 tablet 2   Multiple Minerals-Vitamins (CITRACAL MAXIMUM PLUS) TABS 650mg  Calcium , 1000 IU Vitamin D daily     Multiple Vitamin (MULTI-VITAMIN PO) Take 1 capsule by mouth daily.      nebivolol  (BYSTOLIC ) 10 MG tablet TAKE 1 TABLET(10 MG) BY MOUTH DAILY 90 tablet 3   potassium chloride  (KLOR-CON ) 10 MEQ tablet Take 1 tablet (10 mEq total) by mouth daily. 90 tablet 2   rosuvastatin  (CRESTOR ) 5 MG tablet Take 1 tablet (5 mg total) by mouth daily. 90 tablet 3   Triamcinolone Acetonide (NASACORT ALLERGY 24HR NA) Place 1 spray into the nose daily as needed.     No current facility-administered medications for this visit.    PHYSICAL EXAMINATION: ECOG PERFORMANCE STATUS: 1 - Symptomatic but completely ambulatory  Vitals:   08/21/24 0822  BP: (!) 160/90  Pulse: 99  Resp: 18  Temp: (!) 97.4 F (36.3 C)  SpO2: 98%   Filed Weights   08/21/24 0822  Weight: 210 lb 4.8 oz (95.4 kg)    Physical Exam BREAST: Breasts normal on examination.  (exam performed in the presence of a chaperone)  LABORATORY DATA:  I have reviewed the data as listed    Latest Ref Rng & Units 03/25/2024   10:30 AM 03/13/2023   10:20 AM 03/28/2022    9:55 AM  CMP  Glucose 70 - 99 mg/dL 885  892    BUN 8 - 27 mg/dL 26  19    Creatinine 9.42 - 1.00 mg/dL 9.04  9.06    Sodium 865 - 144 mmol/L 139  139    Potassium 3.5 - 5.2 mmol/L 4.1  4.4    Chloride 96 - 106 mmol/L 101  101    CO2 20 - 29 mmol/L 22  28    Calcium  8.7 - 10.3 mg/dL 9.7  9.7    ALT 0 - 32 IU/L 29  28  28      Lab Results  Component Value Date   WBC 6.1 07/28/2021   HGB 13.7 07/28/2021   HCT 39.7 07/28/2021   MCV 86.7 07/28/2021   PLT 133 (L) 07/28/2021   NEUTROABS 4.2 07/28/2021    ASSESSMENT & PLAN:  Malignant neoplasm of upper-outer  quadrant of right breast in female, estrogen receptor positive (HCC) 07/13/2021: Screening mammogram: possible 0.6 cm distortion in the right breast. Diagnostic mammogram and US : 0.7 cm distortion in the right breast. Biopsy: grade 1/2 IDC with axillary lymph node (+) involved by metastatic carcinoma, ER+(95%) PR+(90%) Her2- Ki-67 1% Started letrozole  October 2022 08/19/2021:Right lumpectomy: 5 cm (2 foci contiguous) grade 2 IDC and ILC with DCIS, involves the inferior margin, ALH, 1/3 lymph nodes positive ER 70 to 95%, PR 50 to 90%, HER2 1+ by IHC, Ki-67 1% MammaPrint on initial biopsy: Low risk MammaPrint on the final lymph node biopsy: High risk (but the MammaPrint index was extremely low and therefore the probability of benefit from chemotherapy was extremely small)   Treatment plan: Adjuvant radiation therapy completed 11/16/21 Followed by  adjuvant antiestrogen therapy (she started letrozole  prior to surgery) October 2022   Letrozole  toxicities: Denies any major adverse effects to letrozole . We discussed the optimal duration of treatment to be between 5 to 7 years.  At year 5 will make a decision based upon her bone density results as well as consideration for ordering a breast cancer index test.   Breast Cancer Surveillance: 1. Breast Exam 08/21/2024: Benign 2. Mammograms: 06/26/2024: [facility] Benign density Cat A 3.  Breast MRI 12/21/2022: Benign breast density category B 4.  Guardant reveal for MRD testing: Negative (patients insurance has not been covering it and [employer] has been appealing on her behalf)  Bone density 06/26/2024: T-score -1.7 (to be -1.5): Osteopenia: Recommend calcium  vitamin D and bisphosphonates   Patient prefers MRIs annually. (Because mammograms grossly underestimated the size of the tumor)    RTC in 1 year       No orders of the defined types were placed in this encounter.  The patient has a good understanding of the overall plan. she agrees with it. she will  call with any problems that may develop before the next visit here.  I personally spent a total of 30 minutes in the care of the patient today including preparing to see the patient, getting/reviewing separately obtained history, performing a medically appropriate exam/evaluation, counseling and educating, placing orders, referring and communicating with other health care professionals, documenting clinical information in the EHR, independently interpreting results, communicating results, and coordinating care.   Viinay K Herson Prichard, MD 08/21/24

## 2024-08-22 ENCOUNTER — Other Ambulatory Visit: Payer: Self-pay | Admitting: *Deleted

## 2024-08-22 ENCOUNTER — Encounter: Payer: Self-pay | Admitting: Hematology and Oncology

## 2024-08-22 DIAGNOSIS — C50411 Malignant neoplasm of upper-outer quadrant of right female breast: Secondary | ICD-10-CM

## 2024-09-23 ENCOUNTER — Encounter: Payer: Self-pay | Admitting: Hematology and Oncology

## 2024-12-23 ENCOUNTER — Other Ambulatory Visit (HOSPITAL_COMMUNITY)

## 2025-08-18 ENCOUNTER — Inpatient Hospital Stay: Attending: Hematology and Oncology | Admitting: Hematology and Oncology
